# Patient Record
Sex: Female | Born: 1940
Health system: Southern US, Community
[De-identification: ages and names within clinical notes are randomized; demographics above are authoritative.]

## PROBLEM LIST (undated history)

## (undated) DIAGNOSIS — F419 Anxiety disorder, unspecified: Secondary | ICD-10-CM

## (undated) DIAGNOSIS — I219 Acute myocardial infarction, unspecified: Secondary | ICD-10-CM

## (undated) DIAGNOSIS — F329 Major depressive disorder, single episode, unspecified: Secondary | ICD-10-CM

## (undated) DIAGNOSIS — I639 Cerebral infarction, unspecified: Secondary | ICD-10-CM

## (undated) DIAGNOSIS — N289 Disorder of kidney and ureter, unspecified: Secondary | ICD-10-CM

## (undated) DIAGNOSIS — I10 Essential (primary) hypertension: Secondary | ICD-10-CM

## (undated) DIAGNOSIS — K219 Gastro-esophageal reflux disease without esophagitis: Secondary | ICD-10-CM

## (undated) DIAGNOSIS — C801 Malignant (primary) neoplasm, unspecified: Secondary | ICD-10-CM

## (undated) DIAGNOSIS — E119 Type 2 diabetes mellitus without complications: Secondary | ICD-10-CM

## (undated) DIAGNOSIS — F32A Depression, unspecified: Secondary | ICD-10-CM

## (undated) DIAGNOSIS — E785 Hyperlipidemia, unspecified: Secondary | ICD-10-CM

## (undated) HISTORY — DX: Acute myocardial infarction, unspecified: I21.9

## (undated) HISTORY — DX: Gastro-esophageal reflux disease without esophagitis: K21.9

## (undated) HISTORY — PX: COLOSTOMY: SHX63

## (undated) HISTORY — PX: ABDOMINAL HYSTERECTOMY: SHX81

## (undated) HISTORY — DX: Major depressive disorder, single episode, unspecified: F32.9

## (undated) HISTORY — DX: Anxiety disorder, unspecified: F41.9

## (undated) HISTORY — DX: Cerebral infarction, unspecified: I63.9

## (undated) HISTORY — DX: Depression, unspecified: F32.A

## (undated) HISTORY — DX: Hyperlipidemia, unspecified: E78.5

## (undated) HISTORY — PX: CHOLECYSTECTOMY: SHX55

## (undated) HISTORY — PX: TONSILLECTOMY: SUR1361

## (undated) HISTORY — DX: Essential (primary) hypertension: I10

## (undated) HISTORY — PX: OOPHORECTOMY: SHX86

---

## 2002-03-13 ENCOUNTER — Encounter: Payer: Self-pay | Admitting: Internal Medicine

## 2002-03-13 ENCOUNTER — Ambulatory Visit (HOSPITAL_COMMUNITY): Admission: RE | Admit: 2002-03-13 | Discharge: 2002-03-13 | Payer: Self-pay | Admitting: Internal Medicine

## 2002-03-21 ENCOUNTER — Encounter: Payer: Self-pay | Admitting: Internal Medicine

## 2002-03-21 ENCOUNTER — Ambulatory Visit (HOSPITAL_COMMUNITY): Admission: RE | Admit: 2002-03-21 | Discharge: 2002-03-21 | Payer: Self-pay | Admitting: Internal Medicine

## 2002-07-02 ENCOUNTER — Inpatient Hospital Stay (HOSPITAL_COMMUNITY): Admission: AD | Admit: 2002-07-02 | Discharge: 2002-07-04 | Payer: Self-pay | Admitting: *Deleted

## 2002-07-03 ENCOUNTER — Encounter: Payer: Self-pay | Admitting: *Deleted

## 2002-09-06 ENCOUNTER — Ambulatory Visit (HOSPITAL_BASED_OUTPATIENT_CLINIC_OR_DEPARTMENT_OTHER): Admission: RE | Admit: 2002-09-06 | Discharge: 2002-09-06 | Payer: Self-pay | Admitting: Otolaryngology

## 2003-02-18 ENCOUNTER — Ambulatory Visit (HOSPITAL_COMMUNITY): Admission: RE | Admit: 2003-02-18 | Discharge: 2003-02-18 | Payer: Self-pay | Admitting: Family Medicine

## 2003-02-18 ENCOUNTER — Encounter: Payer: Self-pay | Admitting: Family Medicine

## 2003-02-27 ENCOUNTER — Encounter: Payer: Self-pay | Admitting: Internal Medicine

## 2003-02-27 ENCOUNTER — Ambulatory Visit (HOSPITAL_COMMUNITY): Admission: RE | Admit: 2003-02-27 | Discharge: 2003-02-27 | Payer: Self-pay | Admitting: Internal Medicine

## 2003-03-19 ENCOUNTER — Ambulatory Visit (HOSPITAL_COMMUNITY): Admission: RE | Admit: 2003-03-19 | Discharge: 2003-03-19 | Payer: Self-pay | Admitting: Ophthalmology

## 2003-06-28 ENCOUNTER — Encounter: Payer: Self-pay | Admitting: Family Medicine

## 2003-06-28 ENCOUNTER — Ambulatory Visit (HOSPITAL_COMMUNITY): Admission: RE | Admit: 2003-06-28 | Discharge: 2003-06-28 | Payer: Self-pay | Admitting: Family Medicine

## 2003-12-04 ENCOUNTER — Ambulatory Visit (HOSPITAL_COMMUNITY): Admission: RE | Admit: 2003-12-04 | Discharge: 2003-12-04 | Payer: Self-pay | Admitting: Cardiology

## 2004-03-17 ENCOUNTER — Ambulatory Visit (HOSPITAL_COMMUNITY): Admission: RE | Admit: 2004-03-17 | Discharge: 2004-03-17 | Payer: Self-pay | Admitting: Family Medicine

## 2005-02-23 ENCOUNTER — Encounter (HOSPITAL_COMMUNITY): Admission: RE | Admit: 2005-02-23 | Discharge: 2005-03-25 | Payer: Self-pay | Admitting: Oncology

## 2005-02-24 ENCOUNTER — Ambulatory Visit (HOSPITAL_COMMUNITY): Payer: Self-pay | Admitting: Internal Medicine

## 2005-03-11 ENCOUNTER — Ambulatory Visit: Payer: Self-pay | Admitting: Cardiology

## 2005-03-24 ENCOUNTER — Ambulatory Visit (HOSPITAL_COMMUNITY): Admission: RE | Admit: 2005-03-24 | Discharge: 2005-03-24 | Payer: Self-pay | Admitting: General Surgery

## 2005-04-05 ENCOUNTER — Ambulatory Visit (HOSPITAL_COMMUNITY): Admission: RE | Admit: 2005-04-05 | Discharge: 2005-04-05 | Payer: Self-pay | Admitting: Family Medicine

## 2005-06-15 ENCOUNTER — Inpatient Hospital Stay (HOSPITAL_COMMUNITY): Admission: AD | Admit: 2005-06-15 | Discharge: 2005-06-16 | Payer: Self-pay | Admitting: Internal Medicine

## 2005-06-16 ENCOUNTER — Ambulatory Visit: Payer: Self-pay | Admitting: *Deleted

## 2005-06-29 ENCOUNTER — Ambulatory Visit (HOSPITAL_COMMUNITY): Admission: RE | Admit: 2005-06-29 | Discharge: 2005-06-29 | Payer: Self-pay | Admitting: Family Medicine

## 2005-08-14 ENCOUNTER — Inpatient Hospital Stay (HOSPITAL_COMMUNITY): Admission: EM | Admit: 2005-08-14 | Discharge: 2005-08-19 | Payer: Self-pay | Admitting: Emergency Medicine

## 2005-12-15 ENCOUNTER — Ambulatory Visit: Payer: Self-pay | Admitting: *Deleted

## 2006-08-16 ENCOUNTER — Ambulatory Visit (HOSPITAL_COMMUNITY): Admission: RE | Admit: 2006-08-16 | Discharge: 2006-08-16 | Payer: Self-pay | Admitting: Obstetrics and Gynecology

## 2006-09-14 ENCOUNTER — Ambulatory Visit (HOSPITAL_COMMUNITY): Admission: RE | Admit: 2006-09-14 | Discharge: 2006-09-14 | Payer: Self-pay | Admitting: Family Medicine

## 2006-09-15 ENCOUNTER — Ambulatory Visit: Payer: Self-pay | Admitting: *Deleted

## 2006-09-20 ENCOUNTER — Ambulatory Visit (HOSPITAL_COMMUNITY): Admission: RE | Admit: 2006-09-20 | Discharge: 2006-09-20 | Payer: Self-pay | Admitting: Ophthalmology

## 2006-09-22 ENCOUNTER — Ambulatory Visit (HOSPITAL_COMMUNITY): Admission: RE | Admit: 2006-09-22 | Discharge: 2006-09-22 | Payer: Self-pay | Admitting: *Deleted

## 2006-09-22 ENCOUNTER — Ambulatory Visit: Payer: Self-pay | Admitting: Cardiology

## 2006-12-10 ENCOUNTER — Inpatient Hospital Stay (HOSPITAL_COMMUNITY): Admission: EM | Admit: 2006-12-10 | Discharge: 2006-12-17 | Payer: Self-pay | Admitting: Emergency Medicine

## 2007-02-16 ENCOUNTER — Inpatient Hospital Stay (HOSPITAL_COMMUNITY): Admission: EM | Admit: 2007-02-16 | Discharge: 2007-02-24 | Payer: Self-pay | Admitting: Emergency Medicine

## 2007-02-16 ENCOUNTER — Ambulatory Visit: Payer: Self-pay | Admitting: Gastroenterology

## 2007-02-17 ENCOUNTER — Ambulatory Visit: Payer: Self-pay | Admitting: Internal Medicine

## 2007-04-05 ENCOUNTER — Ambulatory Visit (HOSPITAL_COMMUNITY): Payer: Self-pay | Admitting: Family Medicine

## 2007-04-05 ENCOUNTER — Encounter (HOSPITAL_COMMUNITY): Admission: RE | Admit: 2007-04-05 | Discharge: 2007-05-05 | Payer: Self-pay | Admitting: Family Medicine

## 2007-04-26 ENCOUNTER — Ambulatory Visit (HOSPITAL_COMMUNITY): Admission: RE | Admit: 2007-04-26 | Discharge: 2007-04-26 | Payer: Self-pay | Admitting: Family Medicine

## 2007-09-11 ENCOUNTER — Ambulatory Visit (HOSPITAL_COMMUNITY): Admission: RE | Admit: 2007-09-11 | Discharge: 2007-09-11 | Payer: Self-pay | Admitting: Family Medicine

## 2007-09-11 ENCOUNTER — Encounter: Payer: Self-pay | Admitting: Orthopedic Surgery

## 2007-10-09 ENCOUNTER — Ambulatory Visit: Payer: Self-pay | Admitting: Orthopedic Surgery

## 2007-10-09 DIAGNOSIS — M25519 Pain in unspecified shoulder: Secondary | ICD-10-CM | POA: Insufficient documentation

## 2007-10-09 DIAGNOSIS — M75120 Complete rotator cuff tear or rupture of unspecified shoulder, not specified as traumatic: Secondary | ICD-10-CM | POA: Insufficient documentation

## 2008-07-11 ENCOUNTER — Ambulatory Visit (HOSPITAL_COMMUNITY): Admission: RE | Admit: 2008-07-11 | Discharge: 2008-07-11 | Payer: Self-pay | Admitting: Family Medicine

## 2008-10-01 ENCOUNTER — Ambulatory Visit (HOSPITAL_COMMUNITY): Admission: RE | Admit: 2008-10-01 | Discharge: 2008-10-01 | Payer: Self-pay | Admitting: Family Medicine

## 2008-11-11 ENCOUNTER — Ambulatory Visit (HOSPITAL_COMMUNITY): Admission: RE | Admit: 2008-11-11 | Discharge: 2008-11-11 | Payer: Self-pay | Admitting: Family Medicine

## 2009-01-16 ENCOUNTER — Ambulatory Visit: Payer: Self-pay | Admitting: Cardiology

## 2009-01-22 ENCOUNTER — Ambulatory Visit (HOSPITAL_COMMUNITY): Admission: RE | Admit: 2009-01-22 | Discharge: 2009-01-22 | Payer: Self-pay | Admitting: Cardiology

## 2009-01-22 ENCOUNTER — Ambulatory Visit: Payer: Self-pay | Admitting: Cardiology

## 2009-01-22 ENCOUNTER — Encounter: Payer: Self-pay | Admitting: Cardiology

## 2009-04-06 ENCOUNTER — Emergency Department (HOSPITAL_COMMUNITY): Admission: EM | Admit: 2009-04-06 | Discharge: 2009-04-07 | Payer: Self-pay | Admitting: Emergency Medicine

## 2009-07-25 ENCOUNTER — Ambulatory Visit (HOSPITAL_COMMUNITY): Admission: RE | Admit: 2009-07-25 | Discharge: 2009-07-25 | Payer: Self-pay | Admitting: Family Medicine

## 2009-11-14 ENCOUNTER — Ambulatory Visit (HOSPITAL_COMMUNITY): Admission: RE | Admit: 2009-11-14 | Discharge: 2009-11-14 | Payer: Self-pay | Admitting: Ophthalmology

## 2010-02-20 ENCOUNTER — Ambulatory Visit: Payer: Self-pay | Admitting: Cardiology

## 2010-02-20 DIAGNOSIS — I6529 Occlusion and stenosis of unspecified carotid artery: Secondary | ICD-10-CM | POA: Insufficient documentation

## 2010-02-20 DIAGNOSIS — I350 Nonrheumatic aortic (valve) stenosis: Secondary | ICD-10-CM | POA: Insufficient documentation

## 2010-02-20 DIAGNOSIS — I2510 Atherosclerotic heart disease of native coronary artery without angina pectoris: Secondary | ICD-10-CM

## 2010-02-26 ENCOUNTER — Ambulatory Visit (HOSPITAL_COMMUNITY): Admission: RE | Admit: 2010-02-26 | Discharge: 2010-02-26 | Payer: Self-pay | Admitting: Cardiology

## 2010-02-26 ENCOUNTER — Ambulatory Visit: Payer: Self-pay | Admitting: Cardiology

## 2010-02-26 ENCOUNTER — Encounter: Payer: Self-pay | Admitting: Cardiology

## 2010-03-02 ENCOUNTER — Encounter: Payer: Self-pay | Admitting: Cardiology

## 2010-09-21 ENCOUNTER — Ambulatory Visit (HOSPITAL_COMMUNITY): Admission: RE | Admit: 2010-09-21 | Discharge: 2010-09-21 | Payer: Self-pay | Admitting: Nephrology

## 2010-10-15 ENCOUNTER — Inpatient Hospital Stay (HOSPITAL_COMMUNITY): Admission: EM | Admit: 2010-10-15 | Discharge: 2010-04-05 | Payer: Self-pay | Admitting: Emergency Medicine

## 2010-11-28 ENCOUNTER — Encounter: Payer: Self-pay | Admitting: Family Medicine

## 2010-11-29 ENCOUNTER — Encounter: Payer: Self-pay | Admitting: Cardiology

## 2010-11-29 ENCOUNTER — Encounter: Payer: Self-pay | Admitting: Family Medicine

## 2010-11-29 ENCOUNTER — Encounter: Payer: Self-pay | Admitting: General Surgery

## 2010-12-10 NOTE — Letter (Signed)
Summary: [place] [employer], agricultural at [facility]  [address] [place] [address]   Phone: [phone]  Fax: [phone]      March 02, 2010 MRN: [id number]   [name] [name] [address] [place], [place]  [address]   Dear Ms. Dilorenzo,  Your test ordered by Selena Batten has been reviewed by your physician (or physician assistant) and was found to be normal or stable. Your physician (or physician assistant) felt no changes were needed at this time.  __X__ Echocardiogram  ____ Cardiac Stress Test  ____ Lab Work  __X__ Peripheral vascular study of arms, legs or neck (Carotid Duplex)  ____ CT scan or X-ray  ____ Lung or Breathing test  ____ Other: Please continue on current medical treatment. Also, If you are not allergic to Aspirin please take 81mg  by mouth once daily. Thank you.   Valera Castle, MD, F.A.C.C

## 2010-12-10 NOTE — Assessment & Plan Note (Signed)
Summary: 1 yr f/u per checkout on 01/16/09/tg  Medications Added * XANAX 0.5MG  take 1 tab as needed CLONIDINE HCL 0.2 MG TABS (CLONIDINE HCL) take 1 tab qid TOPROL XL 100 MG XR24H-TAB (METOPROLOL SUCCINATE) take 1 tab daily NEXIUM 40 MG CPDR (ESOMEPRAZOLE MAGNESIUM) take 1 tab daily FEXOFENADINE HCL 180 MG TABS (FEXOFENADINE HCL) take 1 tab daily TYLENOL 325 MG TABS (ACETAMINOPHEN) take 1 tab at bedtime ONDANSETRON HCL 4 MG TABS (ONDANSETRON HCL) take as needed for nausea ZOLPIDEM TARTRATE 10 MG TABS (ZOLPIDEM TARTRATE) take 1 tab at bedtime      Allergies Added:   Visit Type:  Follow-up Primary Provider:  Dr.Mark Nobie Putnam  CC:  chest pain and sob.  History of Present Illness: [name] [name] comes in today for follow for nonobstructive coronary disease, history of atrial fibrillation, and chronic chest pain.  A sore year ago. At that time she was having chest [name] [name] pain.  She is very stressed out about her husband being in a nursing home her financial status.  She denies any angina. She has occasional non-coronary chest pain. She has been dyspnea on exertion. She also has some pedal edema.  She denies any orthopnea, PND, palpitations syncope.  Current Medications (verified): 1)  Xanax 0.5mg  .... Take 1 Tab As Needed 2)  Clonidine Hcl 0.2 Mg Tabs (Clonidine Hcl) .... Take 1 Tab Qid 3)  Hydrocodone Apap 7.5/325mg  .... 1q4 Hrs Prn 4)  Toprol Xl 100 Mg Xr24h-Tab (Metoprolol Succinate) .... Take 1 Tab Daily 5)  Nexium 40 Mg Cpdr (Esomeprazole Magnesium) .... Take 1 Tab Daily 6)  Fexofenadine Hcl 180 Mg Tabs (Fexofenadine Hcl) .... Take 1 Tab Daily 7)  Tylenol 325 Mg Tabs (Acetaminophen) .... Take 1 Tab At Bedtime 8)  Ondansetron Hcl 4 Mg Tabs (Ondansetron Hcl) .... Take As Needed For Nausea 9)  Zolpidem Tartrate 10 Mg Tabs (Zolpidem Tartrate) .... Take 1 Tab At Bedtime  Allergies (verified): 1)  ! Pcn 2)  ! Darvocet  Past History:  Past Medical History: Last updated:  10/09/2007 anxiety depression high blood pressure acid reflux cholesterol heart attack  2 strokes  Past Surgical History: Last updated: 10/09/2007 hysterectomy Cholecystectomy ovary removed colostomy Tonsillectomy  Family History: Last updated: 10/09/2007 FH of Cancer:  Family History of Diabetes Family History Coronary Heart Disease female < 66 Family History of Arthritis Hx, family, asthma Hx, family, kidney disease NEC  Social History: Last updated: 10/09/2007 Patient is married.   Risk Factors: Caffeine Use: 2 (10/09/2007)  Risk Factors: Smoking Status: never (10/09/2007)  Review of Systems       negative other than history of present illness  Vital Signs:  Patient profile:   70 year old female Height:      65 inches Weight:      206 pounds BMI:     34.40 Pulse rate:   59 / minute BP sitting:   144 / 64  (right arm)  Vitals Entered By: [name] Saa, [name] [name] (February 20, 2010 9:47 AM)  Physical Exam  General:  obese.   Head:  normocephalic and atraumatic Eyes:  PERRLA/EOM intact; conjunctiva and lids normal. Neck:  Neck supple, no JVD. No masses, thyromegaly or abnormal cervical nodes. Chest [name] [name]:  no deformities or breast masses noted Lungs:  clear to auscultation percussion Heart:  PMI difficult to appreciate, soft S1-S2, systolic murmur along left sternal border. It is difficult to hear S2 split. She has bilateral carotid sounds greater on the left than the right. Msk:  decreased  ROM.   Pulses:  pulses normal in all 4 extremities Extremities:  trace left pedal edema and trace right pedal edema.   Neurologic:  Alert and oriented x 3. Skin:  Intact without lesions or rashes. Psych:  Normal affect.   Problems:  Medical Problems Added: 1)  Dx of Cad, Native Vessel  (ICD-414.01) 2)  Dx of Occlusion&stenos Carotid Art w/o Mention Infarct  (ICD-433.10) 3)  Dx of Cardiac Murmur  (ICD-785.2)  EKG  Procedure date:  02/20/2010  Findings:      normal  size rhythm, occasional PVCs, no ST segment changes.  Impression & Recommendations:  Problem # 1:  CARDIAC MURMUR (ICD-785.2) Assessment New I will obtain a 2-D echocardiogram. I suspect this is aortic sclerosis. Her updated medication list for this problem includes:    Toprol Xl 100 Mg Xr24h-tab (Metoprolol succinate) .Marland Kitchen... Take 1 tab daily  Orders: 2-D Echocardiogram (2D Echo)  Problem # 2:  OCCLUSION&STENOS CAROTID ART W/O MENTION INFARCT (ICD-433.10) Assessment: New I suspect this is a referred sound but will check carotid Dopplers. No change in treatment. Orders: Carotid Duplex (Carotid Duplex)  Problem # 3:  CAD, NATIVE VESSEL (ICD-414.01) Assessment: Unchanged  Her updated medication list for this problem includes:    Toprol Xl 100 Mg Xr24h-tab (Metoprolol succinate) .Marland Kitchen... Take 1 tab daily  Patient Instructions: 1)  Your physician recommends that you schedule a follow-up appointment in: 1 year 2)  Your physician recommends that you continue on your current medications as directed. Please refer to the Current Medication list given to you today. 3)  Your physician has requested that you have a carotid duplex. This test is an ultrasound of the carotid arteries in your neck. It looks at blood flow through these arteries that supply the brain with blood. Allow one hour for this exam. There are no restrictions or special instructions. 4)  Your physician has requested that you have an echocardiogram.  Echocardiography is a painless test that uses sound waves to create images of your heart. It provides your doctor with information about the size and shape of your heart and how well your heart's chambers and valves are working.  This procedure takes approximately one hour. There are no restrictions for this procedure.

## 2011-01-24 LAB — BASIC METABOLIC PANEL
BUN: 16 mg/dL (ref 6–23)
CO2: 30 mEq/L (ref 19–32)
Calcium: 9.7 mg/dL (ref 8.4–10.5)
GFR calc non Af Amer: 42 mL/min — ABNORMAL LOW (ref >60)
Potassium: 4.4 mEq/L (ref 3.5–5.1)
Sodium: 140 mEq/L (ref 135–145)

## 2011-01-24 LAB — HEMOGLOBIN AND HEMATOCRIT, BLOOD: HCT: 38.5 % (ref 36.0–46.0)

## 2011-01-25 LAB — BASIC METABOLIC PANEL
BUN: 15 mg/dL (ref 6–23)
BUN: 16 mg/dL (ref 6–23)
BUN: 28 mg/dL — ABNORMAL HIGH (ref 6–23)
BUN: 34 mg/dL — ABNORMAL HIGH (ref 6–23)
CO2: 18 mEq/L — ABNORMAL LOW (ref 19–32)
CO2: 21 mEq/L (ref 19–32)
CO2: 29 mEq/L (ref 19–32)
Calcium: 9.6 mg/dL (ref 8.4–10.5)
Chloride: 102 mEq/L (ref 96–112)
Chloride: 106 mEq/L (ref 96–112)
Chloride: 110 mEq/L (ref 96–112)
Chloride: 111 mEq/L (ref 96–112)
Creatinine, Ser: 1.45 mg/dL — ABNORMAL HIGH (ref 0.4–1.2)
Creatinine, Ser: 1.51 mg/dL — ABNORMAL HIGH (ref 0.4–1.2)
Creatinine, Ser: 1.57 mg/dL — ABNORMAL HIGH (ref 0.4–1.2)
GFR calc Af Amer: 41 mL/min — ABNORMAL LOW (ref >60)
GFR calc Af Amer: 43 mL/min — ABNORMAL LOW (ref >60)
GFR calc non Af Amer: 33 mL/min — ABNORMAL LOW (ref >60)
GFR calc non Af Amer: 39 mL/min — ABNORMAL LOW (ref >60)
Glucose, Bld: 110 mg/dL — ABNORMAL HIGH (ref 70–99)
Glucose, Bld: 176 mg/dL — ABNORMAL HIGH (ref 70–99)
Glucose, Bld: 268 mg/dL — ABNORMAL HIGH (ref 70–99)
Potassium: 3.5 mEq/L (ref 3.5–5.1)
Potassium: 5.3 mEq/L — ABNORMAL HIGH (ref 3.5–5.1)
Potassium: 5.6 mEq/L — ABNORMAL HIGH (ref 3.5–5.1)
Sodium: 136 mEq/L (ref 135–145)
Sodium: 137 mEq/L (ref 135–145)

## 2011-01-25 LAB — GLUCOSE, CAPILLARY
Glucose-Capillary: 137 mg/dL — ABNORMAL HIGH (ref 70–99)
Glucose-Capillary: 150 mg/dL — ABNORMAL HIGH (ref 70–99)
Glucose-Capillary: 153 mg/dL — ABNORMAL HIGH (ref 70–99)
Glucose-Capillary: 158 mg/dL — ABNORMAL HIGH (ref 70–99)
Glucose-Capillary: 160 mg/dL — ABNORMAL HIGH (ref 70–99)
Glucose-Capillary: 172 mg/dL — ABNORMAL HIGH (ref 70–99)
Glucose-Capillary: 176 mg/dL — ABNORMAL HIGH (ref 70–99)
Glucose-Capillary: 176 mg/dL — ABNORMAL HIGH (ref 70–99)
Glucose-Capillary: 181 mg/dL — ABNORMAL HIGH (ref 70–99)
Glucose-Capillary: 181 mg/dL — ABNORMAL HIGH (ref 70–99)
Glucose-Capillary: 189 mg/dL — ABNORMAL HIGH (ref 70–99)
Glucose-Capillary: 193 mg/dL — ABNORMAL HIGH (ref 70–99)
Glucose-Capillary: 228 mg/dL — ABNORMAL HIGH (ref 70–99)
Glucose-Capillary: 232 mg/dL — ABNORMAL HIGH (ref 70–99)

## 2011-01-25 LAB — CBC
HCT: 36.5 % (ref 36.0–46.0)
HCT: 40.3 % (ref 36.0–46.0)
Hemoglobin: 12.1 g/dL (ref 12.0–15.0)
Hemoglobin: 12.6 g/dL (ref 12.0–15.0)
MCHC: 34 g/dL (ref 30.0–36.0)
MCHC: 34.1 g/dL (ref 30.0–36.0)
MCHC: 34.3 g/dL (ref 30.0–36.0)
MCV: 87.7 fL (ref 78.0–100.0)
MCV: 87.8 fL (ref 78.0–100.0)
Platelets: 176 10*3/uL (ref 150–400)
Platelets: 182 10*3/uL (ref 150–400)
Platelets: 197 10*3/uL (ref 150–400)
RBC: 4.06 MIL/uL (ref 3.87–5.11)
RDW: 13 % (ref 11.5–15.5)
RDW: 13.3 % (ref 11.5–15.5)
RDW: 13.7 % (ref 11.5–15.5)
WBC: 10.6 10*3/uL — ABNORMAL HIGH (ref 4.0–10.5)

## 2011-01-25 LAB — DIFFERENTIAL
Basophils Absolute: 0 10*3/uL (ref 0.0–0.1)
Basophils Absolute: 0.2 10*3/uL — ABNORMAL HIGH (ref 0.0–0.1)
Basophils Relative: 0 % (ref 0–1)
Basophils Relative: 0 % (ref 0–1)
Basophils Relative: 1 % (ref 0–1)
Eosinophils Absolute: 0 10*3/uL (ref 0.0–0.7)
Eosinophils Absolute: 0 10*3/uL (ref 0.0–0.7)
Eosinophils Relative: 0 % (ref 0–5)
Eosinophils Relative: 3 % (ref 0–5)
Lymphs Abs: 0.7 10*3/uL (ref 0.7–4.0)
Lymphs Abs: 1.4 10*3/uL (ref 0.7–4.0)
Lymphs Abs: 2.9 10*3/uL (ref 0.7–4.0)
Monocytes Absolute: 0.1 10*3/uL (ref 0.1–1.0)
Monocytes Absolute: 0.3 10*3/uL (ref 0.1–1.0)
Monocytes Absolute: 0.8 10*3/uL (ref 0.1–1.0)
Monocytes Relative: 8 % (ref 3–12)
Neutro Abs: 5 10*3/uL (ref 1.7–7.7)
Neutro Abs: 8.3 10*3/uL — ABNORMAL HIGH (ref 1.7–7.7)
Neutro Abs: 8.9 10*3/uL — ABNORMAL HIGH (ref 1.7–7.7)
Neutrophils Relative %: 78 % — ABNORMAL HIGH (ref 43–77)
Neutrophils Relative %: 94 % — ABNORMAL HIGH (ref 43–77)

## 2011-01-25 LAB — URINALYSIS, ROUTINE W REFLEX MICROSCOPIC
Bilirubin Urine: NEGATIVE
Leukocytes, UA: NEGATIVE
Nitrite: NEGATIVE
Specific Gravity, Urine: 1.025 (ref 1.005–1.030)
Urobilinogen, UA: 0.2 mg/dL (ref 0.0–1.0)
pH: 5.5 (ref 5.0–8.0)

## 2011-01-25 LAB — CARDIAC PANEL(CRET KIN+CKTOT+MB+TROPI)
CK, MB: 2.7 ng/mL (ref 0.3–4.0)
CK, MB: 7.5 ng/mL (ref 0.3–4.0)
Relative Index: 4.9 — ABNORMAL HIGH (ref 0.0–2.5)
Relative Index: 5.9 — ABNORMAL HIGH (ref 0.0–2.5)
Relative Index: INVALID (ref 0.0–2.5)
Total CK: 156 U/L (ref 7–177)
Total CK: 42 U/L (ref 7–177)
Total CK: 94 U/L (ref 7–177)
Troponin I: 0.02 ng/mL (ref 0.00–0.06)
Troponin I: 0.04 ng/mL (ref 0.00–0.06)

## 2011-01-25 LAB — BLOOD GAS, ARTERIAL
Acid-base deficit: 1.1 mmol/L (ref 0.0–2.0)
Bicarbonate: 23.5 mEq/L (ref 20.0–24.0)
O2 Saturation: 93 %
TCO2: 21 mmol/L (ref 0–100)
pCO2 arterial: 41.8 mmHg (ref 35.0–45.0)
pO2, Arterial: 64 mmHg — ABNORMAL LOW (ref 80.0–100.0)

## 2011-01-25 LAB — LIPID PANEL
Cholesterol: 193 mg/dL (ref 0–200)
HDL: 56 mg/dL (ref >39)
Triglycerides: 85 mg/dL (ref <150)

## 2011-01-25 LAB — D-DIMER, QUANTITATIVE: D-Dimer, Quant: 1.21 ug/mL-FEU — ABNORMAL HIGH (ref 0.00–0.48)

## 2011-01-25 LAB — BRAIN NATRIURETIC PEPTIDE: Pro B Natriuretic peptide (BNP): 110 pg/mL — ABNORMAL HIGH (ref 0.0–100.0)

## 2011-01-25 LAB — URINE MICROSCOPIC-ADD ON

## 2011-01-25 LAB — RAPID URINE DRUG SCREEN, HOSP PERFORMED
Benzodiazepines: POSITIVE — AB
Cocaine: NOT DETECTED
Opiates: NOT DETECTED

## 2011-01-25 LAB — CK TOTAL AND CKMB (NOT AT ARMC): Total CK: 41 U/L (ref 7–177)

## 2011-01-25 LAB — TSH: TSH: 3.832 u[IU]/mL (ref 0.350–4.500)

## 2011-01-25 LAB — URINE CULTURE

## 2011-03-22 DIAGNOSIS — F329 Major depressive disorder, single episode, unspecified: Secondary | ICD-10-CM

## 2011-03-22 DIAGNOSIS — E785 Hyperlipidemia, unspecified: Secondary | ICD-10-CM

## 2011-03-22 DIAGNOSIS — I639 Cerebral infarction, unspecified: Secondary | ICD-10-CM | POA: Insufficient documentation

## 2011-03-22 DIAGNOSIS — F419 Anxiety disorder, unspecified: Secondary | ICD-10-CM | POA: Insufficient documentation

## 2011-03-22 DIAGNOSIS — I10 Essential (primary) hypertension: Secondary | ICD-10-CM | POA: Insufficient documentation

## 2011-03-22 DIAGNOSIS — K219 Gastro-esophageal reflux disease without esophagitis: Secondary | ICD-10-CM | POA: Insufficient documentation

## 2011-03-22 DIAGNOSIS — I219 Acute myocardial infarction, unspecified: Secondary | ICD-10-CM

## 2011-03-23 NOTE — Assessment & Plan Note (Signed)
[facility]                       [place] [facility] OFFICE NOTE   NAME:[name], [name] SHINSKY                   MRN:          [id number]  DATE:01/16/2009                            DOB:          [birth year]    [name] [name] comes in today per the request of Dr. Patrica Duel for  chest pain.   She has been a former patient of Dr. Graceann Congress.  Dr. Corinda Gubler saw  her last in November 2007.   She has had a history of chest pain but has had nonobstructive coronary  artery disease by cath last being in 2003.  She has normal left  ventricular systolic function, EF 79% with no ischemia on a stress test  in August 2006.   She was struck by chest pain shooting across her chest from right to  left 3 days prior to seeing Dr. Nobie Putnam on the 9th.  She was advised to  go to the emergency room but she went to his office.  There her EKG was  unremarkable.  He wrote her a prescription for Nitrostat, checked Chem-  7, and sent over for evaluation.   Her symptoms are clearly at rest.  There are no other associated  symptoms including nausea, vomiting, diaphoresis, or shortness of  breath.   Her history is also significant for atrial fibrillation, which she has  not had a number of years.  She is currently on Nexium 40 mg a day for  reflux, Toprol 50 mg per day, zolpidem 10 mg at bedtime, clonidine 0.2  mg q.i.d., simvastatin 20 mg a day.   She is intolerant of all nonsteroidals because it makes her blood thick,  and she will have a stroke.  She was told this by Dr. Lowry Bowl, a  cardiologist in [place] years ago.  She lists also penicillin,  Lorcet.   Her exam today, she is in no acute distress.  She is overweight at 217.  Her blood pressure is 147/78, her pulse is 70 and regular.  She is in  sinus rhythm with no acute ST-segment changes.  HEENT is normal except  for poor dentition.  Neck is supple.  Carotid upstrokes were equal  bilaterally without bruits.   No JVD.  Thyroid is not enlarged.  Trachea  is midline.  Neck is supple.  Chest and lungs were clear to auscultation  and percussion without rub or rhonchi.  Heart reveals a poorly  appreciated PMI.  She has large breasts.  She is exquisitely tender  cross her sternum particularly in the mid sternal area and right  costosternal border.  Abdominal exam is obese with good bowel sounds.  Organomegaly cannot be assessed.  Extremities revealed no edema.  There  is no sign of DVT.  Pulses were present.  She has a few varicose veins.  Neuro exam is grossly intact.   ASSESSMENT:  Costochondritis.  This has probably been exacerbated by  inadvertent pull or strain, and she also has large breasts that created  constant full.   This is not cardiac and or coronary in my opinion.   Listening to her chest again I  heard a systolic murmur that I think is  probably aortic.  I could not hear S2 split.  Looking back at her last  echo she has some aortic sclerosis but no stenosis.   PLAN:  1. Followup 2-D echocardiogram to assess aortic valve.  2. Heat to the chest.  3. Avoid exacerbating activities.  4. Supportive bra.  5. No nonsteroidals because of her intolerance in the past.  She can      use Tylenol for pain.  I advised her this was not an anti-      inflammatory.     Thomas C. Daleen Squibb, MD, [facility]  Electronically Signed    TCW/MedQ  DD: 01/16/2009  DT: 01/17/2009  Job #: [id number]   cc:   Patrica Duel, M.D.

## 2011-03-24 ENCOUNTER — Encounter: Payer: Self-pay | Admitting: Cardiology

## 2011-03-24 ENCOUNTER — Ambulatory Visit: Payer: Self-pay | Admitting: Cardiology

## 2011-03-26 NOTE — Consult Note (Signed)
[name] [name], POARCH NO.:  [id number]   MEDICAL RECORD NO.:  [id number]          PATIENT TYPE:  INP   LOCATION:  A219                          FACILITY:  [facility]   PHYSICIAN:  Jorja Loa, M.D.DATE OF BIRTH:  [birth year]   DATE OF CONSULTATION:  DATE OF DISCHARGE:                                 CONSULTATION   REQUESTING PHYSICIAN:  Madelin Rear. Sherwood Gambler, M.D.   REASON FOR CONSULTATION:  Renal insufficiency.   [name] is 70 years old, Caucasian female, with a past medical  history of hypertension, atrial fibrillation, diabetes recently admitted  because of a perforated diverticulosis, status post colostomy placement,  presently admitted because of weakness.  She was found to have  significant elevated BUN and creatinine, hence a consult was called.  The patient denies any previous history of kidney stones.  No history of  renal failure.  And, also she denies any history of diabetic retinopathy  or neuropathy.   PAST MEDICAL HISTORY:  1. She has a history of hypertension.  2. She has a history of bipolar __________  disorder.  3. History of diabetes.  4. History of perforated diverticulosis, status post colectomy with      colostomy placement.  5. History of atrial fibrillation.  6. History of GERD.  7. History of coronary artery disease.  8. History of also some leg swelling.   MEDICATIONS:  At this moment, consist of:  1. Primaxin 25 mg IV daily.  2. Insulin.  3. Solu-Medrol 125 mg IV daily.  4. Lopressor 50 mg p.o. daily.  5. Singulair 10 mg p.o. daily.  6. Protonix 40 mg p.o. daily.  7. She is getting normal saline at 125 cc/hr.  8. Spiriva 18 mg inhaler.  9. Xanax 0.5 mg p.o. daily.  10.Hydralazine 10 mg IV q.4 h. p.r.n.  11.She is also on Dilaudid.  12.She is getting Zofran.  13.Rocephin 1 gram IV daily.   ALLERGIES:  1. SHE IS ALLERGIC TO SULFA.  2. SHE IS ALLERGIC TO PENICILLIN.  3. ASPIRIN.  4. AND SHE IS ALSO ALLERGIC TO  PROPOXYPHENE NAPSYLATE.   SOCIAL HISTORY:  No history of smoking.  No history of alcohol abuse.   FAMILY HISTORY:  Diabetes.  No history of renal failure.   REVIEW OF SYSTEMS:  Mainly complaints of weakness, a poor appetite, some  nausea but no vomiting.  She denies any shortness of breath, chest pain.  She does not have any appetite.  She most of the time drains about 2-3  bags from her colostomy.   PHYSICAL EXAMINATION:  GENERAL:  The patient is alert.  She looks pale,  otherwise anicteric.  HEENT:  She had dry oral mucosa.  VITAL SIGNS:  Temperature of 98, blood pressure 162/70, pulse of 110.  CHEST:  Decreased breath sounds, otherwise lungs were clear.  No rales,  no rhonchi, no egophony.  HEART:  Revealed a regular rate and rhythm.  No murmur.  ABDOMEN:  Soft.  Positive bowel sounds.  EXTREMITIES:  She has plus edema.   Her white blood cell count is 10, hemoglobin 7.7, crit 21.7, platelets  of 235.  Sodium 138, potassium 3.5, BUN 44, creatinine 4.5.  She has  blood work from February 2008, where her BUN was 19 and creatinine 0.86.  Her albumin is 2.2.  Calcium is 6.7.  UA:  Specific gravity is 1.02,  brown cloudy urine, large blood, significant protein but the last time  no protein.  Her urine is nitrate positive.  She has a few bacteria.  She has also hemoccult positive.   ASSESSMENT:  1. Renal insufficiency.  At this moment, it seems to be acute as the      patient has normal BUN and creatinine recently.  The etiology could      be acute tubular necrosis, versus prerenal syndrome, however, since      the patient has recent abdominal surgery and obstructive uropathy      also need to be entertained as a possibility.  2. Anemia secondary to gastrointestinal bleeding.  H&H is very low.  3. History of perforated diverticulosis, status post colectomy with a      colostomy bag.  4. History of bipolar disorder.  5. History of chronic obstructive pulmonary disease.  She is on  a      steroid and inhaler.  6. History of atrial fibrillation.  Her heart rate __________, but      overall seems to be controlled.  She is on Lopressor.  7. Diabetes.  She is on insulin.  8. Urosepsis.  The patient is with very concentrated and turbid urine.      At this moment cultures are pending.  She is on antibiotics.  She      is afebrile.  Her white cell count seems to be normal.   RECOMMENDATIONS:  Aggressive hydration since the patient has also some  edema, probably will give her some Lasix to improve her urine output.  We will continue other treatment and we will follow the patient.  I  agree with ultrasound of the kidneys to rule out obstruction.  I will  continue with other treatments and will follow input and output.      Jorja Loa, M.D.  Electronically Signed     BB/MEDQ  D:  02/16/2007  T:  02/16/2007  Job:  [id number]

## 2011-03-26 NOTE — H&P (Signed)
NAMEJOSIANE, LABINE NO.:  [id number]   MEDICAL RECORD NO.:  [id number]          PATIENT TYPE:  INP   LOCATION:  A306                          FACILITY:  [facility]   PHYSICIAN:  Kirk Ruths, M.D.DATE OF BIRTH:  1941-07-02   DATE OF ADMISSION:  12/10/2006  DATE OF DISCHARGE:  LH                              HISTORY & PHYSICAL   ADDENDUM:   ALLERGIES:  CIPRO, ASPIRIN, TETRACYCLINE, DEMEROL, PENICILLIN, DARVOCET,  AND VICODIN.      Kirk Ruths, M.D.  Electronically Signed     WMM/MEDQ  D:  12/11/2006  T:  12/11/2006  Job:  [id number]

## 2011-03-26 NOTE — Consult Note (Signed)
[name], [name] NO.:  [id number]   MEDICAL RECORD NO.:  [id number]          PATIENT TYPE:  INP   LOCATION:  A222                          FACILITY:  [facility]   PHYSICIAN:  Cecil Cranker, M.D.DATE OF BIRTH:  December 07, 1940   DATE OF CONSULTATION:  06/16/2005  DATE OF DISCHARGE:                                   CONSULTATION   REFERRING PHYSICIAN:  Madelin Rear. Sherwood Gambler, MD.   PRIMARY CARDIOLOGIST:  [place] Bing, M.D.   HISTORY OF PRESENT ILLNESS:  [name] [name] is a 70 year old female with past  medical history significant for nonobstructive coronary artery disease by  catheterization in August 2003, who presented to her primary care physician  with complaints of chest discomfort.  She reports the day prior to admission  while sitting in a car, she had onset of diaphoresis with dull anterior  chest discomfort with associated shortness of breath that lasted  approximately 10 minutes.  For the rest of the day, she continued to have  intermittent chest discomfort.  Denies any relation to exertional pain,  resolved on admission with no recurrence.   PAST MEDICAL HISTORY:  1.  Cardiac catheterization in August 2003 revealed nonobstructive disease      with a normal EF.  2.  Diet-controlled diabetes mellitus.  3.  Gastroesophageal reflux disorder.  4.  Holter monitor in January 2005 that revealed symptomatic PVCs, frequent      chest pain.  5.  Carotid Dopplers in August 2004 revealing no hemodynamically significant      stenosis.  6.  History of left breast mastitis in May 2006.  7.  Asthma.  8.  History of migraines.  9.  History of fatty liver.  10. Status post cholecystectomy.  11. Status post hysterectomy.   SOCIAL HISTORY:  Lives in [place] with her husband.  She has five children.  She is retired.  She has a remote smoking history with cessation many years  ago. No alcohol use or illicit drug use.   ALLERGIES:  PENICILLIN, ASPIRIN, SULFA, DEMEROL,  DARVOCET, VICODIN, CIPRO.   MEDICATIONS PRIOR TO ADMISSION:  1.  Actos, unknown dose, daily.  2.  Toprol, unknown dose, daily.  3.  Nexium, unknown dose, daily.  4.  Ventolin as needed.  5.  Xanax as needed.  6.  Hydrocodone as needed.  7.  Sleeping pill, unknown.   MEDICATIONS IN THE HOSPITAL:  1.  Zithromax 500 mg IV daily.  2.  Rocephin 1 g IV daily.  3.  Nexium 40 mg daily.  4.  Toprol 50 mg daily.  5.  IV normal saline at 50 mL per hour.   FAMILY HISTORY:  Mother deceased at 36 years old secondary to MI.  She had  heart trouble since she was 69 years old.  Father deceased at 75 years old  secondary to congestive heart failure.  Three sisters and two brothers with  history of diabetes.   REVIEW OF SYSTEMS:  Positive for occasional sweats, headache, occasional  lightheadedness, chronic orthopnea, PND, occasional peripheral edema,  occasional palpitations, cough, and wheezing with asthma.  Left hand  numbness occasionally.  Arthralgias all over and occasional nausea.  All  other systems reviewed and are negative except per HPI.   PHYSICAL EXAMINATION:  VITAL SIGNS:  Temperature 97, pulse 71, respirations  20, blood pressure 163/72, weight 235 pounds.  GENERAL:  Well-developed female in no acute distress.  HEENT:  Normocephalic and atraumatic.  Pupils equal, round, and reactive to  light.  NECK:  No bruits, no jugular venous distention, no lymphadenopathy.  CARDIOVASCULAR:  Normal S1 and S2 with regular rate and rhythm.  LUNGS: Clear to auscultation bilaterally without wheezes, rales, or rhonchi.  SKIN:  Warm and dry with no obvious rashes noted.  BREASTS:  Clinical breast exam is deferred.  ABDOMEN:  Obese, soft, nontender, with active bowel sounds.  GU/RECTAL:  Exams deferred.  EXTREMITIES:  No cyanosis, clubbing, or edema.  Distal pulses intact  bilaterally.  No obvious joint deformity is noted.  NEUROLOGIC:  Alert and oriented x 3.  Cranial nerves are grossly  intact.   Chest x-ray reveals stable scarring right horizontal fissure right middle  lobe but mild cardiomegaly.  No edema or acute infiltrate and stable  gastroesophageal banding procedure.  Osteopenia noted.   Electrocardiogram reveals sinus rhythm with PVC at rate of 78 beats per  minute, normal axis, normal PR interval, QRS duration, __________  nonspecific ST abnormalities are noted.   LABORATORY DATA:  White blood cells 8.4, hemoglobin 12.6, hematocrit 37,  platelets 270.  BMET reveals sodium of 140, potassium 4.6, chloride 106, CO2  28, BUN 12, creatinine 0.8, glucose 94.  LFTs within normal limits.  Amylase  38, lipase 22, calcium 9.4.  Cardiac enzymes negative x 3.  D-dimer 0.6.   Chest CT reveals no pulmonary embolus.   IMPRESSION AND PLAN:  1.  Chest discomfort, atypical for coronary disease.  D-dimer was mildly      elevated.  Chest CT ruled out pulmonary embolus.  Cardiac enzymes have      been negative x 3 for acute myocardial infarction.  She is having      adenosine Myoview today for further evaluation of ischemia.  2.  Diabetes mellitus.  Treatment and evaluation per primary team.  3.  Gastroesophageal reflux disorder.  Continue on Nexium.  Treatment per      primary team.   Patient interviewed and examined by Dr. Glennon Hamilton.  He agrees with  assessment and plan.  We appreciate this consult. Be happy to follow this  patient with you while she is in the hospital.      [name]   AB/MEDQ  D:  06/16/2005  T:  06/16/2005  Job:  [id number]   cc:   Madelin Rear. Sherwood Gambler, MD  [address]  [place]  [place] [address]  Fax: [phone]   [place] Bing, M.D.

## 2011-03-26 NOTE — H&P (Signed)
[name], [name] NO.:  [id number]   MEDICAL RECORD NO.:  [id number]          PATIENT TYPE:  INP   LOCATION:  A202                          FACILITY:  [facility]   PHYSICIAN:  Corrie Mckusick, M.D.  DATE OF BIRTH:  [birth year]   DATE OF ADMISSION:  08/14/2005  DATE OF DISCHARGE:  LH                                HISTORY & PHYSICAL   ADMISSION DIAGNOSIS:  Diverticulitis, possible abscess.   HISTORY OF PRESENT ILLNESS:  This is a 70 year old female with a history of  coronary artery disease, CVA, diverticulosis, asthma, GERD, diabetes, and  hypertension who presents with left lower quadrant pain.  She came into the  emergency room with two to three days of left lower quadrant pain getting  worse.  She felt like this was her prior diverticulitis flare which she has  not had in quite some time.   In the emergency department, she was overall stable from a vitals  standpoint, but had a diverticulitis clearly on the left side with  questionable abscess.  The radiologist could not rule this out.  This was  per the emergency room physician.  Luckily, she has no other symptomatology  from a cardiovascular or respiratory standpoint.  She has no chest pain,  shortness of breath, or other complaints.   She was admitted in August with syncope and has overall done well as an  outpatient.  She had an Adenosine Myoview which was negative on June 15, 2005.  Ejection fraction was 79%.   PAST MEDICAL HISTORY:  1.  Asthma.  2.  Coronary artery disease.  3.  CVA.  4.  GERD.  5.  Diverticulosis.  6.  Leukoplakia of the right buccal mucosa.  7.  Diabetes.  8.  Hypertension.   SOCIAL HISTORY:  Does not drink nor smoke.  No illicit drug use.   FAMILY HISTORY:  Positive for coronary disease and asthma.   REVIEW OF SYSTEMS:  As per the HPI.   MEDICATIONS ON ADMISSION:  The patient is unaware of her doses of  medications, and family member will be bringing in the doses.  1.   Toprol daily.  2.  Albuterol p.r.n.  3.  Nexium 40 mg b.i.d.  4.  Xanax 0.5 mg q.6h. p.r.n.   ALLERGIES:  1.  CIPRO.  2.  ASPIRIN.  3.  TETRACYCLINE.  4.  DEMEROL.  5.  PENICILLIN.  6.  DARVOCET.  7.  VICODIN.   PHYSICAL EXAMINATION:  VITAL SIGNS:  Temperature 97, blood pressure 130/58,  pulse 72, respirations 20.  GENERAL:  When I saw the patient, she was pleasant, talkative, feeling  overall quite well.  HEENT:  Nasal and oropharynx clear with moist mucous membranes.  NECK:  Supple, no lymphadenopathy or JVD.  CHEST:  Clear to auscultation bilaterally.  CARDIOVASCULAR:  Regular rate and rhythm, normal S1 and S2, no S3, S4,  murmurs, rubs, or gallops.  ABDOMEN:  Bowel sounds positive, soft, nontender except mild tenderness in  the left lower quadrant, no rebound, no guarding.  EXTREMITIES:  No edema.   LABORATORY DATA:  White  count 8.5, hematocrit 34.8, platelets 263.  Sodium  142, potassium 3.3, chloride 104, bicarbonate 26, glucose 109, BUN 9,  creatinine 0.9.  Urinalysis moderate blood, negative nitrites, negative  leukocyte esterase.  CT scan as stated above.   ASSESSMENT AND PLAN:  A 70 year old female with multiple comorbidities as  stated above who presents with diverticulitis and possible abscess.  Plan:  Admit to 2A for close monitoring, intravenous fluid with potassium  replacement.  We will cover with Flagyl 500 mg intravenously q.6h., and for  right now Bactrim DS p.o. b.i.d. due to her abundance of allergies.  If she  does not seem to improve, we will start imipenem as the [employer]  suggests.  We will get Dr. Katrinka Blazing to consult from a surgical standpoint.  We  will continue to follow her closely.      Corrie Mckusick, M.D.  Electronically Signed     JCG/MEDQ  D:  08/15/2005  T:  08/15/2005  Job:  [id number]

## 2011-03-26 NOTE — Procedures (Signed)
NAMEBRITTANE, GRUDZINSKI NO.:  [id number]   MEDICAL RECORD NO.:  [id number]          PATIENT TYPE:  INP   LOCATION:  A222                          FACILITY:  [facility]   PHYSICIAN:  Olga Millers, M.D. LHCDATE OF BIRTH:  [birth year]   DATE OF PROCEDURE:  06/16/2005  DATE OF DISCHARGE:                                  ECHOCARDIOGRAM   INDICATIONS FOR PROCEDURE:  The patient is a 70 year old female with past  medical history of nonobstructive coronary disease by catheterization in  August 2003.  She has been admitted to [facility] with chest pain  and study is performed to exclude ischemia.   BASELINE DATA:  Myoview 30 mCi stress images and 10 mCi of Myoview stress  images.  Adenosine was infused in the typical fashion.   The patient's resting heart rate was 76 and following infusion it was 101.  The blood pressure at rest was 130/78 and following infusion was 138/78.  There was no chest pain during this study.  There are no ST changes.  There  are occasional PACs and PVCs as well as Wenckebach noted.  This study was  terminated secondary to completion of infusion.   RESULTS:  The images were reconstructed short axis, vertical as well as  horizontal.  The stress images reveal a small defect in the anterior wall as  well as the apex.  When compared to the rest images, there is no  reversibility noted.  The ejection fraction was 79% and the wall motion was  normal.   FINAL INTERPRETATION:  Normal adenosine Myoview with no diagnostic  electrocardiographic changes.  Sonographic results show probable breast  attenuation, but no ischemia.  Ejection fraction was 79% and the wall motion  was normal.       BC/MEDQ  D:  06/16/2005  T:  06/17/2005  Job:  [id number]

## 2011-03-26 NOTE — Letter (Signed)
September 15, 2006    Patrica Duel, M.D.  [address], [address]  [place], [place]. [address]   RE:  [name] [name], [name] [name]  MRN:  [id number]  /  DOB:  22-Feb-1941   Dear Loraine Leriche:   Thank you for the opportunity to see your nice patient, [name] [name],  concerning prolonged chest pain. As you know, she has had recurrent chest  pain in the past. She had a cath in 1991 and again in 2003 revealing no  significant coronary disease.  She had a stress-test in August, 2006  revealing an EF of 79% and no diagnostic EKG changes and no ischemia.   The present symptoms started on Saturday, 5 days ago, then fairly persistent  and seem to be most consistent with musculoskeletal type pain.   She does have some palpitations and I note from the chart that she has had  documented atrial fibrillation back in 2005.   Present therapy includes Toprol XL 50, Nexium 40, Ambien 10, simvastatin.  Apparently she is unable to take aspirin.   EXAMINATION:  VITAL SIGNS:  Blood pressure 110/70, pulse 81, normal sinus  rhythm.  GENERAL APPEARANCE:  Obesity, JVP is not elevated, carotid pulses palpable  without bruit.  LUNGS: Clear.  CARDIAC EXAM: Unremarkable.  EXTREMITIES: No edema.   IMPRESSION:  1. Atypical chest pain.  2. History of paroxysmal atrial fibrillation. Patient continues to have      occasional palpitations.  3. Obesity.  4. No significant coronary disease and negative stress-test.   I have suggested and event monitor to document whether she is having  recurring atrial fibrillation and she may be a candidate for  anticoagulation. Also suggested 2D echocardiogram.   Thank you for the opportunity to share in this nice patient's care.    Sincerely,     ______________________________  E. Graceann Congress, MD, [facility]    EJL/MedQ  DD: 09/15/2006  DT: 09/15/2006  Job #: [id number]

## 2011-03-26 NOTE — Procedures (Signed)
[name] [name], [name] [name] NO.:  [id number]   MEDICAL RECORD NO.:  [id number]          PATIENT TYPE:  INP   LOCATION:  A222                          FACILITY:  [facility]   PHYSICIAN:  Cecil Cranker, M.D.DATE OF BIRTH:  06-27-1941   DATE OF PROCEDURE:  06/16/2005  DATE OF DISCHARGE:                                    STRESS TEST   INDICATIONS FOR PROCEDURE:  [name] is a 70 year old female with known  coronary disease which was not obstructive by catheterization in 2003.  She  presents with atypical chest discomfort.  Cardiac enzymes are negative x3.  She has no acute changes on her electrocardiogram to suggest ischemia.  Cardiac risk factors include family history of diabetes and a remote tobacco  abuse.   BASELINE DATA:  Electrocardiogram reveals a sinus rhythm at 66 beats per  minute with PVC, nonspecific ST abnormalities.  Blood pressure is 130/78.   Adenosine 60 mg was infused over a 4 minute protocol with Myoview injected  at 3 minutes.  The patient had flushed feeling and significant nausea that  resolved in recovery.  Electrocardiogram revealed several blocked atrial  beats, frequent PVCs and no ischemic changes were noted.   IMPRESSION:  Final images and results are pending M.D. review.      [name] [name]   AB/MEDQ  D:  06/16/2005  T:  06/16/2005  Job:  [id number]

## 2011-03-26 NOTE — H&P (Signed)
NAMESHATEKA, PETREA NO.:  [id number]   MEDICAL RECORD NO.:  [id number]          PATIENT TYPE:  INP   LOCATION:  A219                          FACILITY:  [facility]   PHYSICIAN:  Madelin Rear. Sherwood Gambler, MD  DATE OF BIRTH:  Apr 27, 1941   DATE OF ADMISSION:  02/16/2007  DATE OF DISCHARGE:  LH                              HISTORY & PHYSICAL   CHIEF COMPLAINT:  Weakness.   HISTORY OF PRESENT ILLNESS:  The patient had progressive increasing  weakness, fatigue following two days of chills without true rigors.  She  denied cough or increased shortness of breath.  Did feel severe fatigue.  She has had possibly some hematuria associated with these symptoms.  Severity is minor.  Location is urinary as well as generalized  constitutional.  There are no exacerbating or ameliorating conditions or  circumstances.   PAST MEDICAL HISTORY:  Steroid dependent COPD, severe asthma.  She is  recently status post diverticular perforation with surgery performed at  [facility] and is status post colostomy.  She has steroid-induced diabetes  mellitus.  Atrial fibrillation.  Coronary artery disease,  gastroesophageal reflux disease, leukoplakia of right buccal mucosa.   SOCIAL HISTORY:  She does not use any alcohol.  She does not smoke.  No  illicit drug use.   FAMILY HISTORY:  Positive for coronary artery disease and asthma but,  otherwise, contributory.   REVIEW OF SYSTEMS:  HEAD AND NECK:  She denied any headache, blurred  vision or stiff neck.  No epistaxis, no tinnitus or vertigo.  No  dysphagia or odynophagia.  CARDIOVASCULAR:  Under HPI.  No palpitations.  CARDIOPULMONARY:  She denied any cough or sputum, no palpitations.  Please see HPI.  GI:  No nausea, vomiting or diarrhea.  She was not  aware of any melena or hematochezia from the colostomy output.  According to her it has been putting out stool normally.  EXTREMITIES:  Positive for notable swelling recently. ALLERGIES/IMMUNOLOGIC:   She  denied any skin rash.   PHYSICAL EXAMINATION:  GENERAL:  She is pale-appearing with evident  facial telangiectasia.  HEAD AND NECK:  No JVD or adenopathy. Neck supple.  CHEST:  Clear.  CARDIAC:  Irregularly irregular rhythm with good controlled ventricular  response rate.  No gallop or rub.  ABDOMEN:  Soft, colostomy patent.  No evidence of active bleeding in the  bag at present.  No organomegaly or masses.  No guarding or rebound  tenderness.  EXTREMITIES:  No clubbing or cyanosis.  Positive nonpitting edema of the  distal leg.  NEUROLOGICAL:  Grossly nonfocal.   LABORATORY DATA:  Show severe anemia at 7.3 g% hemoglobin, 21.7%  hematocrit.  White count 10.5 with evident left shift, 83 polys,  platelet count normal at 235.  Hemoccult stool was obtained and is  positive from a colostomy.  Electrolytes were concerning for a new onset  of renal failure with BUN 44, creatinine 4.54.  Calcium was normal.  Albumin 2.2.  Liver function tests, otherwise, unrevealing.  Her  urinalysis revealed 7-10 white cells per high power field and severe  hematuria with too  numerous to count red cells.  Cultures are pending at  the present time of her urine.   Radiographs not available to me at the time will be reviewed when they  are available.   IMPRESSION:  1. The patient presents with severe anemia, hematuria, and pyuria.      Positive Hemoccult stools.  Anemia may be multifactorial, but for      the time being she certainly needs to be admitted for transfusion.      Will begin parenteral antibiotics respecting her allergies.  We use      broad spectrum coverage with Primaxin pending outcomes of blood      culture, radiograph evaluation, and urine culture.  2. Gastrointestinal bleed.  Consult GI.  Institute high dose proton      pump inhibitor parenterally for any stress ulcerations.  3. Steroid-dependent asthma and chronic obstructive pulmonary disease.      The patient is not hypotensive  nor hyperkalemic nor hyponatremic to      suggest any Addison's, but I will cover with some steroids at      present anyway.  Will of course avoid any Lovenox or other      probleeding measures for deep venous thrombosis prophylaxis with      her active gastrointestinal bleed and severe anemia evident.  4. Previous steroid-induced diabetes.  Will go ahead and cover her      blood sugars with a moderate sensitivity sliding scale.  5. Renal failure new onset.  May be prerenal, but will evaluate her      kidneys and also have renal consultation with Dr. Kristian Covey.      Madelin Rear. Sherwood Gambler, MD  Electronically Signed     LJF/MEDQ  D:  02/16/2007  T:  02/16/2007  Job:  [id number]

## 2011-03-26 NOTE — Procedures (Signed)
[name] [name], [name] [name] NO.:  [id number]   MEDICAL RECORD NO.:  [id number]          PATIENT TYPE:  OUT   LOCATION:  RAD                           FACILITY:  [facility]   PHYSICIAN:  Gerrit Friends. Dietrich Pates, MD, FACCDATE OF BIRTH:  1941/08/05   DATE OF PROCEDURE:  09/22/2006  DATE OF DISCHARGE:                                  ECHOCARDIOGRAM   REFERRING PHYSICIANS:  1. Patrica Duel, M.D.  2. Cecil Cranker, MD, [facility]   CLINICAL DATA:  A 70 year old woman with atrial fibrillation and diabetes.   M-MODE:  Aorta 2.4, left atrium 5.2, septum 1.4, posterior wall 1.4, LV  diastole 3.9, LV systole 2.8.   IMPRESSION:  1. Technically suboptimal and limited echocardiographic study.  2. Left atrium is significantly enlarged.  The right atrium is not well      seen, but is apparently normal in size.  3. Normal right ventricular size and function; moderate hypertrophy.  4. Normal aortic root diameter with mild calcification of the wall.  5. Aortic valve not ideally imaged.  There is a slight increase in      velocity across the valve, which may represent trivial, if any,      stenosis.  No aortic insufficiency identified.  6. Mitral valve not well seen; no regurgitation identified.  7. Tricuspid valve, pulmonic valve and proximal pulmonary artery      inadequately imaged.  8. Normal left ventricular size; not all segments adequately imaged - no      areas of akinesis or dyskinesis.  Hypertrophy is present.  Overall left      ventricular systolic function appears normal.  9. Normal inferior vena cava.      Gerrit Friends. Dietrich Pates, MD, [facility]  Electronically Signed     RMR/MEDQ  D:  09/22/2006  T:  09/23/2006  Job:  [id number]

## 2011-03-26 NOTE — Discharge Summary (Signed)
NAMEZOIEY, CHRISTY NO.:  [id number]   MEDICAL RECORD NO.:  [id number]          PATIENT TYPE:  INP   LOCATION:  A219                          FACILITY:  [facility]   PHYSICIAN:  Madelin Rear. Sherwood Gambler, MD  DATE OF BIRTH:  22-Mar-1941   DATE OF ADMISSION:  02/16/2007  DATE OF DISCHARGE:  04/18/2008LH                               DISCHARGE SUMMARY   DISCHARGE DIAGNOSES:  1. Severe anemia secondary to multifactorial reasons including      gastrointestinal bleed.  2. Steroid-dependent asthma and chronic obstructive pulmonary disease,      severe.  3. The patient also has previous steroid-induced diabetes mellitus.  4. New-onset renal failure.   DISCHARGE MEDICATIONS:  Xanax, metoprolol, Ambien, Nexium, Combivent,  Singulair, Avelox, potassium, Lasix, Medrol Dosepak.   SUMMARY:  The patient was admitted with severe anemia, evident GI  bleeding and mild exacerbation of COPD.  IV hydration and consultation  by Renal resulted in improvement in her renal function, which appeared  to be mostly prerenal azotemia.  She may have had a little bit of acute  tubular necrosis superimposed on this, although this is not certain at  present.  She responded to transfusion.  She was also seen in  consultation by GI and subsequent discharged in stable condition.   FOLLOWUP:  In office.      Madelin Rear. Sherwood Gambler, MD  Electronically Signed     LJF/MEDQ  D:  03/10/2007  T:  03/10/2007  Job:  [id number]

## 2011-03-26 NOTE — Discharge Summary (Signed)
[name] [name], WARMAN                      ACCOUNT NO.:  [id number]   MEDICAL RECORD NO.:  [id number]                   PATIENT TYPE:  INP   LOCATION:  3737                                 FACILITY:  [facility]   PHYSICIAN:  Cristy Hilts. Jacinto Halim, M.D.                  DATE OF BIRTH:  1941/10/23   DATE OF ADMISSION:  07/02/2002  DATE OF DISCHARGE:  07/04/2002                                 DISCHARGE SUMMARY   DISCHARGE DIAGNOSES:  1. Chest pain, etiology not determined, status post coronary angiography     with essentially clear coronary arteries.  2. Possible gastroesophageal reflux disease and/or esophageal spasm.  3. Remote hiatal hernia repair [id number] .  4. Remote cholecystectomy.  5. Severe morbid obesity.  6. Osteoarthritis.  7. Bilateral cataracts.  8. Adult-onset diabetes mellitus.  9. Prior cerebrovascular accident.  10.      Status post remote hysterectomy.  11.      History of palpitations.   HISTORY OF PRESENT ILLNESS AND HOSPITAL COURSE:  The patient is a 70-year-  old patient of Dr. Geanie Logan who had a remote history of catheterization  without significant coronary artery disease over a decade ago at [facility] whose records were not available developed an episode of chest pain  that was felt to be compatible with ischemia.  She was started on IV  nitroglycerin and heparin on admission and on July 02, 2002; underwent  coronary angiography on July 03, 2002 performed by Dr. Alanda Amass.   The procedure revealed normal left main coronary artery, left anterior  descending artery had 20-30% minimal narrowing, the first diagonal branch  was normal, the circumflex was nondominant and moderate size with normal  marginal 1, moderately enlarged marginal 2 and moderate-sized obtuse  marginal 3, all vessels normal.  Right coronary artery was a dominant vessel  and there was no ostial or proximal stenosis and dilatation went well  without any reformation.  Left  ventricular angiogram was performed and  showed normal left ventricular function with 60% ejection fraction and  angiographic evidence of left ventricular hypertrophy.  Abdominal aortic  angiogram showed normal superior mesenteric artery and celiac,  normal  single right coronary artery and two normal left renal arteries.  The  patient tolerated the procedure well and was transferred to the holding area  in stable condition and then later returned to her room.   The next morning on July 03, 2002 she was seen by Dr. Jacinto Halim and her blood  pressure was 124/80, pulse 72, she remained in sinus rhythm, She did not  present any complaints of chest pain, no shortness of breath and was deemed  to be stable for discharge home.   EKG on admission did not show any significant changes but one slightly  elevated ST in 3 and aVF which could be considered as a possible inferior MI  and which prompted her catheterization.  Her renal function was fine.  BMP the day of discharge showed potassium 4.4,  sodium 140, chloride 106, carbon dioxide 29, BUN 16, creatinine 0.9, and  glucose 101.  Hemoglobin was 11.9, hematocrit 35.3, and platelet count 230.   DISPOSITION:  The patient was discharged home in stable condition free of  pain.   DISCHARGE INSTRUCTIONS:  She was instructed not to drive or pick up or lift  any heavy objects greater than five pounds and avoid strenuous physical  activity x3 days postcatheterization.  She is to stay on low-cholesterol,  low-carbohydrate diet.  She is to report any bleeding, oozing, or any other  problems with groin site to our office and the phone number was provided.   FOLLOWUP:  A followup appointment was scheduled with Dr. Domingo Sep at the  [place] office on July 19, 2002 at 11:20 a.m.     Raymon Mutton, P.A.-C                  Cristy Hilts. Jacinto Halim, M.D.    MK/MEDQ  D:  07/11/2002  T:  07/12/2002  Job:  [id number]   cc:   [facility]  [place]  [address]  [place] [address]   [facility] [place]  [address]  [place], [place] [address]

## 2011-03-26 NOTE — Discharge Summary (Signed)
NAMEMEREDITH, KILBRIDE NO.:  [id number]   MEDICAL RECORD NO.:  [id number]          PATIENT TYPE:  INP   LOCATION:  A306                          FACILITY:  [facility]   PHYSICIAN:  Kirk Ruths, M.D.DATE OF BIRTH:  1941-09-07   DATE OF ADMISSION:  12/10/2006  DATE OF DISCHARGE:  02/09/2008LH                               DISCHARGE SUMMARY   DATE OF TRANSFER:  December 17, 2006   DISCHARGE DIAGNOSIS:  1. Status asthmaticus.  2. Perforated colon secondary to diverticulitis.  3. Type 2 diabetes.  4. History of severe chronic obstructive pulmonary disease and asthma.  5. History of hypertension.  6. Prior history of bipolar disorder.   HOSPITAL COURSE:  This is a 70 year old female who has longstanding COPD  with severe asthma.  She is steroid dependent; and has steroid induced  diabetes.  She was admitted through the emergency room with status  asthmaticus refractory to nebs and IV steroids.  She was admitted to the  floor and continued on IV steroids, IV nebs, as well as IV antibiotics.  Her chest x-ray Initially consistent with asthma without evidence of the  pneumonia.  The patient's blood sugars were somewhat exacerbated during  her stay due to steroids.  She was covered with sliding scale of  insulin.   The patient also has chronic back pain and was treated for her back pain  during her stay.  The patient improved very slowly, having one episode  of some chest pain where her EKG and enzymes were negative.  Repeated  chest x-rays, again, showed no significant change, and no evidence of  infiltrates.  Her steroids were decreased.  She was placed on oral  prednisone.  And then, abruptly on the February 9th she reported pain in  her left lower quadrant CT scan was immediately ordered, as well as a  CBC showing a white count of 13,000, hemoglobin 12.1.  CT showed  perforated colon secondary acute diverticulitis.  Surgical consult was  promptly obtained.  Dr.  Barbaraann Barthel, who evaluated the case, felt  that the patient's surgery would be better performed at [facility]  due to her multiple medical problems, including steroid-dependent  respiratory disease.  She was promptly transferred to [facility].      Kirk Ruths, M.D.  Electronically Signed     WMM/MEDQ  D:  12/31/2006  T:  12/31/2006  Job:  [id number]

## 2011-03-26 NOTE — H&P (Signed)
[name], [name] NO.:  [id number]   MEDICAL RECORD NO.:  [id number]          PATIENT TYPE:  INP   LOCATION:  A306                          FACILITY:  [facility]   PHYSICIAN:  Kirk Ruths, M.D.DATE OF BIRTH:  August 10, 1941   DATE OF ADMISSION:  12/10/2006  DATE OF DISCHARGE:  LH                              HISTORY & PHYSICAL   CHIEF COMPLAINT:  Shortness of breath.   HISTORY OF PRESENT ILLNESS:  The patient is a 70 year old female with  prolonged history of COPD with asthma, who presents to the emergency  room with a 2 day history of progressive shortness of breath and  wheezing. In the emergency room, the patient was refractory to  nebulizers with albuterol, Atrovent, and will be admitted for intensive  respiratory treatment, IV steroids, etc. The patient's x-ray report is  normal, without pneumonia. White count was 5,000.   ALLERGIES:  PENICILLIN, HYDROCODONE.   CURRENT MEDICATIONS:  Xanax, Lopressor 50 mg daily, __________ 2  mg at  bedtime. The patient has a history of hypertension, diabetes, and  bipolar disorder. She says that she is not on medications at this time  for any of those problems, except possibly the blood pressure. In the  past, she has been on Diovan, Glucovance, Lithium, Effexor, Clonidine  and Vytorin.   REVIEW OF SYSTEMS:  She denies chest pain, nausea, vomiting, and  diarrhea.   PHYSICAL EXAMINATION:  VITAL SIGNS:  An elderly female who is somewhat  uncomfortable, respiratory-wise. She is afebrile. Her blood pressure is  150/80. Pulse is 100 and regular. Respiratory rate 24 and unlabored.  HEENT:  TM's normal. Pupils are equal, round, and reactive to light and  accommodation. Oropharynx benign.  NECK:  Supple without JVD, bruit or thyromegaly.  LUNGS:  With diffuse expiratory wheezes.  HEART:  Regular rate and rhythm. No murmur, rub, or gallop.  ABDOMEN:  Soft, nontender.  EXTREMITIES:  Without clubbing, cyanosis, or edema.  NEUROLOGIC:  Examination is grossly intact.   ASSESSMENT:  1. Status asthmaticus.  2. Chronic obstructive pulmonary disease.  3. History of hypertension.  4. History of type 2 diabetes.  5. History of bipolar.      Kirk Ruths, M.D.  Electronically Signed     WMM/MEDQ  D:  12/11/2006  T:  12/11/2006  Job:  [id number]

## 2011-03-26 NOTE — Discharge Summary (Signed)
[name] [name], ECKERT NO.:  [id number]   MEDICAL RECORD NO.:  [id number]          PATIENT TYPE:  INP   LOCATION:  A222                          FACILITY:  [facility]   PHYSICIAN:  Madelin Rear. Sherwood Gambler, MD  DATE OF BIRTH:  October 27, 1941   DATE OF ADMISSION:  06/15/2005  DATE OF DISCHARGE:  08/09/2006LH                                 DISCHARGE SUMMARY   DISCHARGE MEDICATIONS:  1.  Simvastatin 20 mg p.o. daily.  2.  Toprol-XL 50 mg p.o. daily.  3.  Ambien 10 mg p.o. q.h.s. p.r.n.  4.  Nexium 40 mg p.o. daily.  5.  Xanax 0.25 mg p.o. q.i.d. p.r.n.  6.  Lorcet plus, 1 or 2 q.6h. p.r.n.  7.  Zithromax 500 mg p.o. daily.  8.  Keflex 500 mg p.o. q.i.d.  9.  Xopenex 1.25 mg neb q.i.d.   DISCHARGE DIAGNOSIS:  1.  Gastroesophageal reflux disease.  2.  Syncope.  3.  Asthma.  4.  Coronary artery disease.  5.  Status post cerebrovascular accident.  6.  Diverticulitis.  7.  Right buccal mucosa leukoplakia.  8.  Diabetes mellitus type 2.  9.  Chronic pain syndrome.  10. Hypertension.   SUMMARY:  The patient was admitted with syncope with multiple comorbidities.  Was seen in consultation by cardiology who accomplished an adenosine Myoview  which was negative for ischemia.  Ejection fraction was 79%, wall motion was  noted to be normal by Dr. Dietrich Pates.  He had incidental CT spiral of the  chest to rule out pulmonary embolus per protocol and was negative in house.  Chest pain on admission that was mentioned turned out to be atypical in  nature.  As above, workup and enzymes were negative and the patient was  discharged in stable for followup with cardiology as well as in our office.     Madelin Rear. Sherwood Gambler, MD  Electronically Signed    LJF/MEDQ  D:  07/11/2005  T:  07/11/2005  Job:  [id number]

## 2011-03-26 NOTE — Consult Note (Signed)
[name] [name], [name] [name] NO.:  [id number]   MEDICAL RECORD NO.:  [id number]          PATIENT TYPE:  INP   LOCATION:  A219                          FACILITY:  [facility]   PHYSICIAN:  Kassie Mends, M.D.      DATE OF BIRTH:  [birth year]   DATE OF CONSULTATION:  02/16/2007  DATE OF DISCHARGE:                                 CONSULTATION   STAT CONSULTATION   REASON FOR CONSULTATION:  GI bleed.   HISTORY OF PRESENT ILLNESS:  [name] [name] is a 70 year old female, who  was admitted to the hospital complaining of blood in her urine.  She has  a past medical history of perforated colon secondary to diverticulitis  in February of 2008.  She reports blood in her urine, burning with  urination, and lower abdominal pain for three weeks.  She denies any  problem swallowing, or heartburn and indigestion.  She complains of  having problems with decreased appetite since February of 2008.  She  states that she has been losing weight.  However, she weighed 99.6 kg in  February and weighs 101 kg on today.  She has had liquid brown stool in  her bag.  She reports only changing her bag every four days.  She denies  any black stool in her bag or blood in her bag.  She has yellow, jelly  stuff coming from her rectum.  She denies any blood from her rectum.  She denies any bloody nose.  She has had no rash or sores in her mouth.  She has not been coughing up any blood.  She denies any upper abdominal  pain.  She does report feeling gaggy.   PAST MEDICAL HISTORY:  1. Asthma.  2. COPD.  3. Hypertension.  4. Bipolar disorder.   PAST SURGICAL HISTORY:  1. Diverting colostomy in February of 2008.  2. Cholecystectomy.  3. Total abdominal hysterectomy.  4. Open hiatal hernia repair.   ALLERGIES:  1. PENICILLIN.  2. SULFA.  3. DARVOCET.  4. ASPIRIN.  5. TETRACYCLINE.  6. DEMEROL.  7. CIPRO.  8. ZOCOR.   MEDICATIONS:  Lasix, Imipenem, NovoLog sliding scale, Solu-Medrol,  Lopressor,  Singulair, Spiriva.   FAMILY HISTORY:  She has a family history of coronary artery disease and  hypertension.   SOCIAL HISTORY:  She does not smoke or drink.  She lives with her  family.   REVIEW OF SYSTEMS:  She has never had a colonoscopy.  Otherwise, her  review of systems is per the HPI.  Otherwise all systems are negative.   PHYSICAL EXAMINATION:  VITAL SIGNS:  Her temperature is 98.5, blood  pressure 146/76, pulse 106, respiratory rate 20.GENERAL:  She is in no  apparent distress, alert and oriented x4.HEENT:  Atraumatic,  normocephalic.  Pupils are equal and react to light.  Mouth:  No oral  lesions.  Posterior pharynx without erythema or exudate.NECK:  Full  range of motion and no lymphadenopathy.  LUNGS:  Clear to auscultation bilaterally.CARDIOVASCULAR:  Regular  rhythm, no murmur, normal S1 and S2.  ABDOMEN:  Bowel sounds are present, soft, she has mild  to moderate  tenderness to palpation in the right lower quadrant and the left upper  quadrant.  She has a dry dressing in the midline of the lower abdomen.  She has a colostomy bag in the left lower quadrant with light-brown  liquid material contained within the bag.  She has some formed stool at  the ostomy site.  She has no rebound or guarding.  Her abdomen is  obese.EXTREMITIES:  No cyanosis or clubbing.  She has trace edema.  NEURO:  She has no focal neurologic deficits.   LABORATORY DATA:  White count 10.5, hemoglobin 7.3, platelets 235,  potassium 3.5, BUN 44, creatinine 4.5, alk-phos 69, AST 8, ALT 9,  albumin 2.2.  UA:  Specific gravity of 1.025, bilirubin is small, blood  large, protein greater than 300, nitrate positive, squamous cells few,  WBCs 7 to 10, bacteria few, hemoccult positive.   ASSESSMENT:  [name] [name] is a 70 year old female, who was admitted with  three weeks of hematuria, nausea, and dysuria, now with acute renal  failure and evidence of urinary tract infection.  She has no overt  evidence of  gastrointestinal bleed (hematemesis, melena, or  hematochezia).  She has a normocytic anemia and hemoccult-positive  stool.  She has never had a colonoscopy.  The differential diagnosis  includes colorectal polyp and gastritis.  Thank you for allowing me to  see [name] [name] in consultation.  My recommendations follow.   RECOMMENDATIONS:  1. She has no acute indication for endoscopy.  2. I will check a ferritin level and agree with transfusion.  3. I would add Protonix daily.  4. I would obtain a discharge summary from [facility] of the      February of 2008 admission and the operative note.  5. Will follow on a daily basis and if she manifests any evidence of      active GI bleed then would proceed with upper endoscopy or      colonoscopy.      Kassie Mends, M.D.  Electronically Signed     SM/MEDQ  D:  02/16/2007  T:  02/16/2007  Job:  [id number]

## 2011-03-26 NOTE — Discharge Summary (Signed)
[name] [name], [name] NO.:  [id number]   MEDICAL RECORD NO.:  [id number]          PATIENT TYPE:  INP   LOCATION:  A202                          FACILITY:  [facility]   PHYSICIAN:  Corrie Mckusick, M.D.  DATE OF BIRTH:  [birth year]   DATE OF ADMISSION:  08/14/2005  DATE OF DISCHARGE:  10/12/2006LH                                 DISCHARGE SUMMARY   DISCHARGE DIAGNOSIS:  Diverticulitis.   For history of present illness and past medical history, please see  admission H&P.   HOSPITAL COURSE:  A 70 year old female with history of coronary artery  disease, CVA, diverticulosis, asthma, GERD, diabetes and hypertension who  presents with left lower quadrant pain consistent with diverticulitis. There  is a questionable possible abscess, and Dr. Katrinka Blazing was consulted. She loads  of allergies, so she was not able to use Cipro. We actually used gentamicin  and imipenem after starting her on Flagyl and Bactrim. She improved greatly  from her moment of admission on the p.o. Bactrim as well as the IV Flagyl.   The patient improved to almost 75% after the first day. She had no fevers  and was remarkably doing well. We continued her IV antibiotics for the next  few days. Again, she continued to improve.   She was ready to eat on the 10th, and we started her on clear liquids and  advanced as tolerated. We continued her on IV antibiotics for that day as  well.   She was ready to transition to p.o. antibiotics on 11th which she did  transition to Flagyl 500 t.i.d. as well as Bactrim DS 500 b.i.d. She was  sent home on this as well.   DISCHARGE PHYSICAL EXAMINATION:  VITAL SIGNS:  T-max 97.6, blood pressure  140s/80s, heart rate in the 70s, respiratory rate in the 20s.  GENERAL:  Pleasant female in no acute distress.  HEENT:  Nasopharynx clear with moist mucous membranes.  NECK:  Supple.  CHEST:  Clear to auscultation bilaterally.  CARDIOVASCULAR:  Regular rate and rhythm without  murmurs.  ABDOMEN:  Bowel sounds positive, soft, nontender, nondistended.   DISCHARGE MEDICATIONS:  1.  Bactrim DS b.i.d. for 7 days.  2.  Flagyl 500 t.i.d. for 7 days.  3.  Metoprolol XL 50 mg p.o. daily.  4.  Protonix 40 mg p.o. b.i.d.  5.  Zocor 20 mg p.o. q.p.m.  6.  Xanax 0.5 mg p.o. p.r.n.   DISCHARGE CONDITION:  Improved and stable.   DISCHARGE FOLLOWUP:  In one week with [place]. I strongly encouraged the  patient if she gets any increase in pain or recurrence of pain at all she is  to call and return immediately.      Corrie Mckusick, M.D.  Electronically Signed     JCG/MEDQ  D:  08/19/2005  T:  08/19/2005  Job:  [id number]

## 2011-03-26 NOTE — Op Note (Signed)
[name] [name], [name] [name] NO.:  [id number]   MEDICAL RECORD NO.:  [id number]                   PATIENT TYPE:  AMB   LOCATION:  DSC                                  FACILITY:  [facility]   PHYSICIAN:  Suzanna Obey, MD                      DATE OF BIRTH:  1941/05/29   DATE OF PROCEDURE:  09/06/2002  DATE OF DISCHARGE:                                 OPERATIVE REPORT   PREOPERATIVE DIAGNOSIS:  Oral leukoplakia.   POSTOPERATIVE DIAGNOSIS:  Oral leukoplakia.   PROCEDURE:  Laser excision and ablation of oral leukoplakia.   ANESTHESIA:  General endotracheal tube.   ESTIMATED BLOOD LOSS:  Less than 1 cc.   INDICATIONS:  A 70 year old who is a chronic snuff user and has a  significant amount of very concerning-appearing leukoplakia all along her  right buccal mucosa and retromolar trigone.  It extends up to her anterior  commissure.  It is very verrucous-appearing.  She was informed of the risks  and benefits of the laser excision, including bleeding, infection, scarring,  need for possible additional surgery or treatment, very sore mouth, and risk  of the anesthetic.  All questions were answered and consent was obtained.   DESCRIPTION OF PROCEDURE:  The patient was taken to the operating room and  placed in the supine position after adequate general endotracheal tube  anesthesia with a laser endotracheal tube.  She was covered with wet towels  circumferentially around the mouth.  The leukoplakia was then examined with  retractors and the anterior aspect was begun, making a laser incision right  at the edge and very thinly removing the mucosal surface and extending back  to the midportion of this leukoplakia.  This was sent for permanent section.  The posterior aspect as well as along the commissure was removed, again the  same technique with a superficial layer excision with the layer, removing it  with careful dissection.  This was also sent as a posterior  margin of  resection.  The entire area was approximately a 2 x 2 cm area.  The  remaining leukoplakia, which was along the buccal mucosa and the retromolar  trigone, was not as verrucous-appearing and was lasered in the defocus mode  with 6 power, and the superficial layer was burned away.  She then had good  hemostasis, and there was no additional leukoplakia that could be observed.  She was then awakened, brought to the recovery room in stable condition with  counts correct.                                               Suzanna Obey, MD    JB/MEDQ  D:  09/06/2002  T:  09/06/2002  Job:  [id number]  cc:   Patrica Duel, MD  [address]  [place]  [place] [address]  Fax: [phone]

## 2011-03-26 NOTE — Consult Note (Signed)
NAMEQUEENIE, AUFIERO NO.:  [id number]   MEDICAL RECORD NO.:  [id number]          PATIENT TYPE:  INP   LOCATION:  A306                          FACILITY:  [facility]   PHYSICIAN:  Barbaraann Barthel, M.D. DATE OF BIRTH:  22-Oct-1941   DATE OF CONSULTATION:  12/17/2006  DATE OF DISCHARGE:                                 CONSULTATION   NOTE:  Surgery was asked to see this 70 year old female for abdominal  pain and sigmoid perforation.   HISTORY OF PRESENT MEDICAL ILLNESS:  This patient was admitted a week  ago to the hospital to stabilize her severe status asthmaticus and  developed, over the last 24 or 48 hours, abdominal pain that increased.  She had a CT scan today which revealed a sigmoid perforation with free  air.  She has been on steroids for her asthma and she is also diabetic  and obese.  Other medical problems include hypertension, history of  coronary artery disease with three catheterizations in the past, bipolar  disorder, and hypertension.   PHYSICAL EXAMINATION:  GENERAL:  An obese white female who is not moving  around without discomfort.  VITAL SIGNS:  She is afebrile with temperature 98, heart rate 91 per  minute, blood pressure 125/60, respirations 22 per minute.  She is 5  feet 2 inches and weighs greater than 90 kg.  HEENT:  Head is normocephalic.  Extraocular movements are intact.  Pupils were round and react to light and accommodation.  There is some  conjunctive pallor.  Nasal and oral mucosa is moist.  NECK:  The neck is short. There are there are no bruits auscultated.  No  adenopathy and no jugular vein distension or thyromegaly.  CHEST:  The patient has bilateral wheezes.  HEART:  Rate is regular, somewhat tachy.  BREASTS AND AXILLA:  Without masses.  ABDOMEN:  A right subcostal incision consistent with a previous  cholecystectomy and midline incision which was for a Nissen procedure,  an infraumbilical midline incision for hysterectomy.   She is acutely  tender in the left lower quadrant and the left iliac fossa. Bowel sounds  are diminished.  She has no femoral or inguinal herniae appreciated.  RECTAL:  Her stool is guaiac negative.  EXTREMITIES:  +1 pretibial edema.  Palpable femoral extremity and I do  not palpate a popliteal or dorsalis pedis.   REVIEW OF SYSTEMS:  GI SYSTEM:  The patient has no past history of  hepatitis.  She has had a Nissen fundoplication. She does not admit to  any previous episodes of diverticular disease.  No past history of  hepatitis.  She had normal stool yesterday.  No history of bright red  rectal bleeding, black tarry stools, diarrhea or unexplained weight  loss.  GU SYSTEM:  No history of nephrolithiasis.  She has had some  dysuria.  This is likely secondary to bladder irritation from her a  sigmoid perforation.  This has been recent.  CARDIORESPIRATORY SYSTEM:  The patient states that she may have had a heart attack 20 years ago.  She has had three cardiac catheterizations, the last one  being  approximately five years ago.  She is hypertensive and she is a severe  asthmatic.  She does not smoke or drink.  She used to smoke in the past  but has not smoked for many years.  OB/GYN HISTORY:  The patient is a  gravida 5, abortus 0, cesarean 0, female with no family history of  carcinoma of the breast and she has never had a mammogram.  She has had  a TAH and unilateral salpingo-oophorectomy.  MUSCULOSKELETAL SYSTEM:  The patient is obese.  ENDOCRINE SYSTEM:  She is a type 2 diabetic.   MEDICATIONS:  She has currently been placed on Cipro which initially she  was thought to be allergic to but this was an error and she is on Cipro.  She has also been placed on Flagyl.  She has been taking prednisone and  Xanax and she has been on IV Solu-Medrol.  She has been taking Glucotrol  for her diabetes and also been followed by sliding scale and she has  been on Nexium, as well.  She has also been  taking metoprolol 50 mg  daily and been taking hydrocodone and acetaminophen and Zolpidem and  alprazolam 0.5 mg t.i.d.  The Zolpidem has been for hour of sleep.   ALLERGIES:  TETRACYCLINE, PENICILLIN, SULFA, DARVOCET, VICODIN, LORTAB,  ASPIRIN, AND DEMEROL.   LABORATORY DATA:  CT scan as mentioned above with perforation.  Her EKG  shows a sinus rhythm with frequent premature complexes and low voltage  noted.  White count is 13.3 with an H&H of 12.1 and 36.2, with 92%  neutrophils noted.  Electrolytes are grossly within normal limits with  blood sugar 103 and a BUN of 18 and creatinine 0.8.  Liver function  studies are all grossly within normal limits. Urinalysis also grossly  within normal limits. Her last chest x-ray shows mild cardiac  enlargement and bronchitic changes and scattered atelectasis with no  acute abnormality noted at present, this was on February 6, and changes  significant for COPD   IMPRESSION:  1. Diverticulitis with perforation.  2. Coronary artery disease.  3. Hypertension.  4. Diabetes mellitus type 2.  5. Bipolar disorder.  6. Obesity.  7. Asthma.   PLAN:  I have discussed this with Dr. Nobie Putnam who plans to transfer her  to [facility], the reasoning being that to follow her clinically is  difficult because of her steroid dependencies for her asthma and the  likelihood that coming to surgery, she will become long term ventilator  dependent and not compatible with the services of care we can render  here on a long term basis.  This was discussed in detail with Dr.  Nobie Putnam who will make arrangements for transfer.      Barbaraann Barthel, M.D.  Electronically Signed     WB/MEDQ  D:  12/17/2006  T:  12/17/2006  Job:  [id number]   cc:   Patrica Duel, M.D.  Fax: [phone]

## 2011-03-26 NOTE — Cardiovascular Report (Signed)
[name] [name], [name] [name]                      ACCOUNT NO.:  [id number]   MEDICAL RECORD NO.:  [id number]                   PATIENT TYPE:  INP   LOCATION:  3737                                 FACILITY:  [facility]   PHYSICIAN:  Richard A. Alanda Amass, M.D.          DATE OF BIRTH:  03-May-1941   DATE OF PROCEDURE:  07/03/2002  DATE OF DISCHARGE:                              CARDIAC CATHETERIZATION   PROCEDURE:  1. Retrograde central aortic catheterization.  2. Selective coronary angiography by Judkins technique.  3. Left ventricular angiogram, RAO/LAO projection.  4. Abdominal aortic angiogram, midstream PA projection.   DESCRIPTION OF PROCEDURE:  The patient was brought to the second floor CP  lab in a postabsorptive state after 5 mg Valium p.o. premedication.  Heparin  was on hold.  She was on IV nitroglycerin.  The right groin was prepped,  draped in the usual manner.  Xylocaine, 1%, was used for local anesthesia.  The patient was quite agitated in the laboratory and was given 2 mg of  Nubain for sedation and 2 mg of Versed.  The RFA was entered with an  anterior puncture using a #18 thin-walled needle and a #6 [place] short Daig  sidearm sheath was inserted without difficulty.  Catheterization was  performed with #6 French 4-cm taper, preformed Cordis coronary, and pigtail  catheters.  LV angiogram was done in the RAO and LAO projection with a  pigtail #6 French catheter at 25 cc/14 cc per second and 20 cc/12 cc per  second, respectively.  Pullback pressures here were performed and showed no  gradient across the aortic valve.  Abdominal aortic angiogram was done in  the midstream PA projection at 25 cc/20 cc per second because of the  patient's systemic hypertension.  Catheters were removed.  Sidearm sheath  was flushed.  The patient was brought to the holding area for ACT  measurement, sheath removal, and pressure hemostasis in stable condition.   PRESSURES:  1. LV:  150/0; LVEDP 18  mmHg.  2. CA:  150/80 mmHg.  3. There was no gradient across the aortic valve on catheter pullback.   ANGIOGRAPHY:  Fluoroscopy did not reveal any significant coronary,  intracardiac, or valvular calcification.  There was a prosthetic ring in  place in the area of the stomach and distal esophagus, presumably from the  patient's prior hiatal hernia surgery he had done at [facility] several years  ago.  1. The main left coronary was normal.  2. The left anterior descending artery had minor irregularity with less than     20-30% minimal narrowing beyond the first diagonal branch.  The distal     third of the LAD was mildly irregular but no significant stenosis or     atherosclerotic lesions with good flow to the apex where it bifurcated.  3. The first diagonal branch arose before SP-1 and 2 bifurcated and it was     normal.  4.  The circumflex was nondominant but moderate sized.  There was a small OM-     1 that was normal and moderately large OM-2 that bifurcated and was     normal, a moderate sized OM-3 that was normal.  Beyond this, there was a     40-50% narrowing at the PABG branch which was small and bifurcated.  5. The right coronary was a dominant vessel.  There was dilatation a     centimeter after the proximal portion of the ostia.  There was no ostial     or proximal stenosis and the dilatation was mild without aneurysm     formation.  The remainder of the vessel was widely patent and smooth with     a small but normal PDA and PLA and normal RV branches.  6. LV angiogram in the RAO and LAO projection showed a normally contracting     ventricle which was vigorous, EF approximately 60% or greater with     angiographic LVH.  There was no mitral regurgitation.  7. Abdominal aortic angiogram in the midstream PA projection showed a normal     SMA and celiac access, normal single right renal artery, and dual normal     left renal arteries.  The infrarenal abdominal aorta was widely  patent     with no stenosis or aneurysm formation and no significant irregularity.     The common iliacs were normal.  The proximal external iliacs were normal,     mildly tortuous, and the hypogastrics were intact with good runoff     bilaterally.   DISCUSSION:  This 70 year old patient of Dr. Nobie Putnam has a remote history  of catheterization without significant coronary disease over a decade ago at  [facility] but these records are not available.  She has morbid obesity, a history  of diabetes, past CVAs, nonsmoker with COPD and asthma, remote  cholecystectomy, and remote hiatal hernia repair presumably for hiatal  hernia and GI bleeding several years ago at [place].  She is admitted now  with chest pain felt to be compatible with ischemia.  Diagnostic  catheterization shows no significant coronary artery disease.  There is mild  dilatation in the proximal third of the RCA but no aneurysm formation and no  significant stenosis.  There is less than 50% lesion of the distal  circumflex, good normal LV function and mild hypertension.   RECOMMENDATIONS:  I would recommend medical therapy.  She will probably need  GI evaluation with her history of chest pain and past GI disease.   CATHETERIZATION DIAGNOSES:  1. Chest pain etiology not determined.  2. Possible gastroesophageal reflux disease and/or esophageal spasm.  3. Remote hiatal hernia repair, [place] [facility].  4. Remote cholecystectomy.  5. Severe morbid obesity.  6. Osteoarthritis.  7. Bilateral cataracts.  8. Adult onset diabetes mellitus.  9. Prior cerebrovascular accidents.  10.      Remote hysterectomy.  11.      History of palpitations.                                               Richard A. Alanda Amass, M.D.    RAW/MEDQ  D:  07/03/2002  T:  07/03/2002  Job:  [id number]   cc:   Sherral Hammers, M.D.   Jonell Cluck, M.D.  [address]  [place]  [place] [address]  Fax: [phone]   Cardiac Catheterization Lab    Richard A. Alanda Amass, M.D.  [phone] [address]  [place]  [place] [address]  Fax: [phone]   Record Room

## 2011-03-26 NOTE — H&P (Signed)
NAMESTACI, DACK NO.:  [id number]   MEDICAL RECORD NO.:  [id number]           PATIENT TYPE:  INP   LOCATION:  A222                          FACILITY:  [facility]   PHYSICIAN:  Madelin Rear. Sherwood Gambler, MD  DATE OF BIRTH:  [birth year]   DATE OF ADMISSION:  DATE OF DISCHARGE:  LH                                HISTORY & PHYSICAL   CHIEF COMPLAINT:  Syncope.   HISTORY OF PRESENT ILLNESS:  The patient has had shortness of breath,  atypical chest pain, and subsequently while waiting in a hot car developed a  near syncopal episode followed by cold clammy sweat. She complains of  persistent malaise, but no chest pain this morning. She admits to mild  shortness of breath, mild cough nonproductive. She has had no palpitations,  had no recurrence of the syncope. She denies any hematemesis, hematochezia,  or melena.   PAST MEDICAL HISTORY:  1.  Gastroesophageal reflux disease.  2.  Asthma.  3.  Coronary artery disease.  4.  CVA.  5.  Diverticulitis.  6.  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  7.  Diabetes mellitus type 2 dietarily regulated.  8.  Chronic recurrent pain.  9.  She has also had previous hypertension.   SOCIAL HISTORY:  She does not smoke, use alcohol, or other illicit drugs.   FAMILY HISTORY:  Positive for father that is deceased from bone cancer.  Mother also deceased, unknown health problems. She has three sisters in good  health, one brother who has coronary disease, and another brother who is in  good health. Her children are in good health except for some asthma.   REVIEW OF SYSTEMS:  As under HPI. All else negative.   PHYSICAL EXAMINATION:  GENERAL:  She appears ill but not severely acute  distress.  HEART:  Head and neck showed no JVD or adenopathy.  NECK:  Supple.  CHEST:  Clear.  CARDIAC:  Regular rhythm. No murmurs, gallops, or rubs.  ABDOMEN:  Soft with minimal epigastric abdominal tenderness. No organomegaly  or masses.  EXTREMITIES:  No  clubbing, cyanosis, or edema.  NEUROLOGICAL:  Nonfocal.   IMPRESSION:  1.  Syncope in a known diabetic with coronary disease and hypertension. We      are going to admit her for observation, serial EKGs and enzymes.      Cardiology consultation is anticipated. We will also rule out pulmonary      embolism. Overwhelming likelihood is it was vasovagal; however, the      persistence of symptoms is of major concern.  2.  Hypertension. Intervene expectantly.  3.  Asthma. Bronchodilators as needed. Check a chest x-ray. Empiric      antibiotic therapy at present for the cough.  4.  Diverticulosis. Acute intervention as indicated.  5.  Diabetes mellitus type 2. Continue ADA diet. Sliding scale as needed.      Madelin Rear. Sherwood Gambler, MD  Electronically Signed     LJF/MEDQ  D:  06/15/2005  T:  06/15/2005  Job:  [id number]

## 2011-03-26 NOTE — Consult Note (Signed)
[name] [name], [name] NO.:  [id number]   MEDICAL RECORD NO.:  [id number]          PATIENT TYPE:  INP   LOCATION:  A202                          FACILITY:  [facility]   PHYSICIAN:  Jerolyn Shin C. Katrinka Blazing, M.D.   DATE OF BIRTH:  June 25, 1941   DATE OF CONSULTATION:  08/15/2005  DATE OF DISCHARGE:                                   CONSULTATION   This is a 70 year old female admitted by Dr. Phillips Odor for evaluation of  suspected diverticulitis. The patient gives a history of acute onset of  severe abdominal pain on Friday evening. The pain started in the left lower  quadrant and settled in her lower pelvis. She had nausea but no vomiting.  She did not have diarrhea. She denies fever and chills. She has had similar  episodes in the past. She states that she has had at least five episodes,  three of which were treated with oral medication as an outpatient, and  another which required hospitalization.  She does not remember the date of  the hospitalization for diverticulitis. She states the pain that she has now  is just as severe or possibly more severe than she had in the past. The  patient is afebrile and does not have significant leukocytosis. She has  already been started on antibiotic therapy and seems to be making an initial  response.   PAST MEDICAL HISTORY:  1.  Osteoarthritis.  2.  Diabetes mellitus.  3.  History of old CVA.  4.  Gastroesophageal reflux disease.  5.  History of migraine headaches.   PAST SURGICAL HISTORY:  Cholecystectomy, total abdominal hysterectomy,  oophorectomy, and some type of open hiatal hernia repair. She also has  nonobstructive bladder disease.   She has had multiple evaluations for chest pain. She had a cardiac  catheterization in August of 2003. This showed that her left main coronary  artery was normal. Her LAD was normal. There was a 20 to 30% stenosis of the  first diagonal branch. Right coronary normal. There was about a 50% stenosis  of  the circumflex. Ejection fraction by cardiac catheterization was 60%. She  had an echocardiogram that was entirely normal with an ejection fraction of  79%. An abdominal aortogram showed normal iliac, SMA and anterior mesenteric  vessels with normal bilateral renal's. Holter monitor showed sinus rhythm  with multiple PACs and PVCs. Carotid Doppler study in 2004 revealed no  irregularity of her cerebrovascular system though she had a history of CVA.   MEDICATIONS:  Actos, Toprol, Nexium, Ventolin, Xanax and hydrocodone. She is  not quite sure of the dosages.   ALLERGIES:  PENICILLIN (causes swelling).       ASPIRIN, SULFA, DEMEROL,  DARVOCET, VICODIN AND CIPRO (some of these medications only caused GI  upset). She states that she cannot take Vicodin but can take hydrocodone  which are the same medication.   FAMILY HISTORY:  Mother died of an MI at age 48, father died of  complications of congestive heart failure at age 54. She has five siblings  with diabetes mellitus.   SOCIAL HISTORY:  She is  medically disabled. She does not drink, smoke or use  drugs. She has five children.  She lives with her husband.   PHYSICAL EXAMINATION:  GENERAL:  She does not appear to be in any acute  distress. She is a pleasant, obese female who is alert and cooperative,  fully oriented.  VITAL SIGNS:  Blood pressure 130/60, pulse is 82, respiration 16,  temperature is 98 degrees.  HEENT:  Unremarkable.  NECK:  Supple, no JVD, bruit, adenopathy or thyromegaly.  CHEST:  Clear to auscultation.  HEART:  Irregular rhythm, no murmur, no gallop.  ABDOMEN:  Obese with moderately severe tenderness in the left lower quadrant  with guarding and rebound, also with guarding and rebound in the suprapubic  midline. She has good active bowel sounds.  EXTREMITIES:  No clubbing, cyanosis or edema. No joint deformities.   REVIEW OF INITIAL LABS:  Reviewed them to be totally normal. She does not  have leukocytosis. Her  potassium however will need to be improved.  Urinalysis reveals moderate blood but no nitrites or leukocyte esterase.   CT scan reveals a significant left upper sigmoid colon with thickening and  diverticulitis with questionable adjacent cystic mass that may or may not  have a connection with the colon. This does not appear to be in the  mesentery and seems to be lateral to the sigmoid colon. It could possibly be  an abscess but may also represent a cystic mass of the ovary. A pelvic  ultrasound would better define this.   IMPRESSION:  1.  Acute diverticulitis with clinical as well as radiographic evidence of      inflammation of the distal sigmoid colon. There may or may not be a      contained abscess.  2.  Nonobstructive clinically insignificant coronary artery disease.  3.  Osteoarthritis.  4.  Diabetes mellitus, non Insulin dependent.  5.  Gastroesophageal reflux disease.  6.  History of migraine headaches.  7.  History of CVA with normal carotid Doppler studies.   RECOMMENDATIONS:  The best treatment would be broad spectrum antibiotics for  gram negative and anaerobic coverage. With the patient's allergies, it is  difficult to make a suggestion as to what to treat her with. She has already  been started on Flagyl which is a good choice. I am not sure that p.o.  Bactrim is going to offer her high enough serum level in order to be  effective in treating any collections that may be extramural, especially if  the mass in the pelvis is an abscess. Doxycycline at 100 mg twice a day  would be a very good choice to add to the Flagyl. However, Dr. Phillips Odor has  given Tetracycline, though when the patient is questioned she states that  she does not know what Tetracycline is and does not remember ever having a  reaction to it. His other choice is imipenem which is a good choice and if  he decides on imipenem I suggest that she be started on this on his dosing schedule and that gentamycin  be added for the next 3 to 5 days in order to  get a good tissue levels so that the acute inflammatory response can be  avoided. She should have 4 to 6 weeks of oral antibiotics after she is  discharged, if indeed she  responds to this treatment. She should be treated for about 3 to 5 days  based on her clinical response before being discharged home on oral  antibiotics. I will follow along with Dr. Phillips Odor and will be available if  he feels that she is not responding well clinically.      Dirk Dress. Katrinka Blazing, M.D.  Electronically Signed     LCS/MEDQ  D:  08/15/2005  T:  08/15/2005  Job:  [id number]

## 2011-04-29 ENCOUNTER — Ambulatory Visit (INDEPENDENT_AMBULATORY_CARE_PROVIDER_SITE_OTHER): Payer: 59 | Admitting: Cardiology

## 2011-04-29 ENCOUNTER — Encounter: Payer: Self-pay | Admitting: Cardiology

## 2011-04-29 VITALS — BP 141/71 | HR 75 | Ht 62.0 in | Wt 211.0 lb

## 2011-04-29 DIAGNOSIS — I6350 Cerebral infarction due to unspecified occlusion or stenosis of unspecified cerebral artery: Secondary | ICD-10-CM

## 2011-04-29 DIAGNOSIS — I2510 Atherosclerotic heart disease of native coronary artery without angina pectoris: Secondary | ICD-10-CM

## 2011-04-29 DIAGNOSIS — F17200 Nicotine dependence, unspecified, uncomplicated: Secondary | ICD-10-CM

## 2011-04-29 DIAGNOSIS — R011 Cardiac murmur, unspecified: Secondary | ICD-10-CM

## 2011-04-29 DIAGNOSIS — I639 Cerebral infarction, unspecified: Secondary | ICD-10-CM

## 2011-04-29 DIAGNOSIS — I6529 Occlusion and stenosis of unspecified carotid artery: Secondary | ICD-10-CM

## 2011-04-29 NOTE — Progress Notes (Signed)
HPI Mrs. [name] [name] returns today for evaluation and management of her coronary artery disease, history of myocardial infarction, nonobstructive carotid disease, and mild aortic stenosis.  She is having no angina or symptoms of TIAs or mini strokes. She's had several mini strokes in the past. We went over those in detail today.  He is intolerant of aspirin which upsets her stomach. She is on Plavix.  Echocardiogram April 2011 showed mild aortic stenosis with normal left ventricular function. She's had no syncope and no significant dyspnea on exertion.  Carotid Dopplers in April 2011 showed minimal plaque.  Medicines were reconciled and she seems to be compliant. We added aspirin to her allergy list. I note she is not on a statin and I'm not sure why. Also been a major issue. We'll research and see if we can get her on generic. Herl echocardiogram today shows normal sinus rhythm with an occasional PVC otherwise normal.  She still uses smokeless tobacco. Advised to quit.   Past Surgical History  Procedure Date  . Abdominal hysterectomy   . Cholecystectomy   . Oophorectomy   . Colostomy   . Tonsillectomy     No family history on file.  History   Social History  . Marital Status: Married    Spouse Name: N/A    Number of Children: N/A  . Years of Education: N/A   Occupational History  . disabled    Social History Main Topics  . Smoking status: Former Smoker    Types: Cigarettes  . Smokeless tobacco: Current User  . Alcohol Use: No  . Drug Use: No  . Sexually Active: Not on file   Other Topics Concern  . Not on file   Social History Narrative  . No narrative on file    Allergies  Allergen Reactions  . Aspirin   . Penicillins   . Propoxyphene N-Acetaminophen     Current Outpatient Prescriptions  Medication Sig Dispense Refill  . acetaminophen (TYLENOL) 325 MG tablet Take 650 mg by mouth every 6 (six) hours as needed.        . ALPRAZolam (XANAX) 0.5 MG tablet  Take 0.5 mg by mouth at bedtime as needed.        Marland Kitchen amitriptyline (ELAVIL) 25 MG tablet Take 25 mg by mouth at bedtime.        . clopidogrel (PLAVIX) 75 MG tablet Take 75 mg by mouth daily.        Marland Kitchen esomeprazole (NEXIUM) 40 MG capsule Take 40 mg by mouth daily before breakfast.        . HYDROcodone-acetaminophen (NORCO) 7.5-325 MG per tablet Take 1 tablet by mouth every 6 (six) hours as needed.        . metoprolol (TOPROL-XL) 100 MG 24 hr tablet Take 100 mg by mouth daily.        . ondansetron (ZOFRAN) 4 MG tablet Take 4 mg by mouth as needed.        . zolpidem (AMBIEN) 10 MG tablet Take 10 mg by mouth at bedtime as needed.        Marland Kitchen DISCONTD: cloNIDine (CATAPRES) 0.2 MG tablet Take 0.2 mg by mouth 4 (four) times daily.        Marland Kitchen DISCONTD: fexofenadine (ALLEGRA) 180 MG tablet Take 180 mg by mouth daily.          ROS Negative other than HPI.   PE General Appearance: well developed, well nourished in no acute distress, obese HEENT: symmetrical face, PERRLA, Edentulous Neck:  no JVD, thyromegaly, or adenopathy, trachea midline Chest: symmetric without deformity Cardiac: PMI non-displaced, RRR, normal S1, S2, no gallop , 2/6 systolic murmur consistent with aortic stenosis. S2 splits Lung: clear to ausculation and percussion Vascular: all pulses full without bruits, pulse and lower extremity slightly reduced but present  Abdominal: nondistended, nontender, good bowel sounds, no HSM, no bruits Extremities: no cyanosis, clubbing or edema, no sign of DVT, no varicosities  Skin: normal color, no rashes Neuro: alert and oriented x 3, non-focal Pysch: normal affect Filed Vitals:   04/29/11 1108  BP: 141/71  Pulse: 75  Height: 5\' 2"  (1.575 m)  Weight: 211 lb (95.709 kg)  SpO2: 92%    EKG  Labs and Studies Reviewed.   Lab Results  Component Value Date   WBC 10.6* 04/04/2010   HGB 12.1 04/04/2010   HCT 35.6* 04/04/2010   MCV 87.7 04/04/2010   PLT 137* 04/04/2010      Chemistry        Component Value Date/Time   NA 136 04/04/2010 0501   K 4.9 04/04/2010 0501   CL 102 04/04/2010 0501   CO2 29 04/04/2010 0501   BUN 40* 04/04/2010 0501   CREATININE 1.51* 04/04/2010 0501      Component Value Date/Time   CALCIUM 9.6 04/04/2010 0501       Lab Results  Component Value Date   CHOL  Value: 193        ATP III CLASSIFICATION:  <200     mg/dL   Desirable  161-096  mg/dL   Borderline High  >=045    mg/dL   High        02/14/8118   Lab Results  Component Value Date   HDL 56 03/28/2010   Lab Results  Component Value Date   LDLCALC  Value: 120        Total Cholesterol/HDL:CHD Risk Coronary Heart Disease Risk Table                     Men   Women  1/2 Average Risk   3.4   3.3  Average Risk       5.0   4.4  2 X Average Risk   9.6   7.1  3 X Average Risk  23.4   11.0        Use the calculated Patient Ratio above and the CHD Risk Table to determine the patient's CHD Risk.        ATP III CLASSIFICATION (LDL):  <100     mg/dL   Optimal  147-829  mg/dL   Near or Above                    Optimal  130-159  mg/dL   Borderline  562-130  mg/dL   High  >865     mg/dL   Very High* 7/84/6962   Lab Results  Component Value Date   TRIG 85 03/28/2010   Lab Results  Component Value Date   CHOLHDL 3.4 03/28/2010   Lab Results  Component Value Date   HGBA1C  Value: 6.0 (NOTE)  According to the ADA Clinical Practice Recommendations for 2011, when HbA1c is used as a screening test:   >=6.5%   Diagnostic of Diabetes Mellitus           (if abnormal result  is confirmed)  5.7-6.4%   Increased risk of developing Diabetes Mellitus  References:Diagnosis and Classification of Diabetes Mellitus,Diabetes Care,2011,34(Suppl 1):S62-S69 and Standards of Medical Care in         Diabetes - 2011,Diabetes Care,2011,34  (Suppl 1):S11-S61.* 03/28/2010   No results found for this basename: ALT, AST, GGT, ALKPHOS, BILITOT

## 2011-04-29 NOTE — Assessment & Plan Note (Signed)
Mild aortic stenosis by echocardiogram April 2011. Follow clinically.

## 2011-04-29 NOTE — Patient Instructions (Signed)
Your physician recommends that you continue on your current medications as directed. Please refer to the Current Medication list given to you today.  Your physician recommends that you quit snuff  Your physician recommends that you schedule a follow-up appointment in: 1 year

## 2011-04-29 NOTE — Assessment & Plan Note (Signed)
Stable. Continue medical therapy 

## 2011-05-05 ENCOUNTER — Other Ambulatory Visit: Payer: Self-pay

## 2011-05-05 MED ORDER — CLOPIDOGREL BISULFATE 75 MG PO TABS: 75.0000 mg | ORAL_TABLET | Freq: Every day | ORAL | Status: DC

## 2011-05-07 ENCOUNTER — Encounter: Payer: Self-pay | Admitting: Cardiology

## 2011-08-04 ENCOUNTER — Ambulatory Visit: Payer: 59 | Admitting: Adult Health

## 2011-08-04 ENCOUNTER — Ambulatory Visit (INDEPENDENT_AMBULATORY_CARE_PROVIDER_SITE_OTHER): Payer: Medicare Other | Admitting: Physician Assistant

## 2011-08-04 ENCOUNTER — Encounter: Payer: Self-pay | Admitting: Physician Assistant

## 2011-08-04 DIAGNOSIS — R079 Chest pain, unspecified: Secondary | ICD-10-CM | POA: Insufficient documentation

## 2011-08-04 DIAGNOSIS — E785 Hyperlipidemia, unspecified: Secondary | ICD-10-CM

## 2011-08-04 DIAGNOSIS — J449 Chronic obstructive pulmonary disease, unspecified: Secondary | ICD-10-CM

## 2011-08-04 DIAGNOSIS — I060 Rheumatic aortic stenosis: Secondary | ICD-10-CM

## 2011-08-04 DIAGNOSIS — R011 Cardiac murmur, unspecified: Secondary | ICD-10-CM

## 2011-08-04 DIAGNOSIS — I2510 Atherosclerotic heart disease of native coronary artery without angina pectoris: Secondary | ICD-10-CM

## 2011-08-04 DIAGNOSIS — I10 Essential (primary) hypertension: Secondary | ICD-10-CM

## 2011-08-04 NOTE — Assessment & Plan Note (Signed)
Blood pressure controlled. 

## 2011-08-04 NOTE — Assessment & Plan Note (Signed)
Patient is due for her fasting lipid panel

## 2011-08-04 NOTE — Assessment & Plan Note (Signed)
Patient had mild AS on echo in April 2011. We will recheck echo

## 2011-08-04 NOTE — Assessment & Plan Note (Addendum)
The patient had several types of chest pain mostly at rest. Some of it is worrisome for ischemia. She describes it as a dull ache in her chest radiating to her left arm or a heavy feeling in her chest associated with diaphoresis. Her last cardiac catheterization was in 2003 at which time she had a 50% distal circumflex and mild dilatation of the proximal one third of the RCA but no aneurysm. She had normal LV function. Last stress test was in 2006 that was normal.  We will order a lexiscan to evaluate her for ischemia.I have also added Imdur to her medications and instructed her on how to properly take nitroglycerin sublingual.

## 2011-08-04 NOTE — Progress Notes (Signed)
HPI:  This is a 70 year old female patient with history of coronary artery disease with catheter in 2003 revealing 50% distal circumflex below we function, mild dilatation in the proximal one third of the RCA but no aneurysm. She also has atrial fibrillation, hypertension, bipolar disease, history of CVAs, COPD and diabetes mellitus.  Recently the patient was seen by primary care for cough and aching in the back of her shoulders. He suggested that this may be an anginal equivalent and that she see [place]. Since she was treated with prednisone and antibiotics for bronchitis her shoulder pain has resolved.  The patient does admit to a dull ache in her chest that radiates down her left arm. She also has a pressure and heavy feeling in her chest at times associated with diaphoresis. Both these episodes occur at rest and may last 15-20 minutes. She has not used nitroglycerin. She is very inactive because of her COPD and chronic dyspnea on exertion as well as arthritis.    Allergies  Allergen Reactions  . Aspirin   . Penicillins   . Propoxyphene N-Acetaminophen     Current Outpatient Prescriptions on File Prior to Visit  Medication Sig Dispense Refill  . acetaminophen (TYLENOL) 325 MG tablet Take 650 mg by mouth every 6 (six) hours as needed.        . ALPRAZolam (XANAX) 0.5 MG tablet Take 0.5 mg by mouth at bedtime as needed.        Marland Kitchen amitriptyline (ELAVIL) 25 MG tablet Take 25 mg by mouth at bedtime.        . clopidogrel (PLAVIX) 75 MG tablet Take 1 tablet (75 mg total) by mouth daily.  30 tablet  11  . esomeprazole (NEXIUM) 40 MG capsule Take 40 mg by mouth daily before breakfast.        . HYDROcodone-acetaminophen (NORCO) 7.5-325 MG per tablet Take 1 tablet by mouth every 6 (six) hours as needed.        . metoprolol (TOPROL-XL) 100 MG 24 hr tablet Take 100 mg by mouth daily.        . ondansetron (ZOFRAN) 4 MG tablet Take 4 mg by mouth as needed.        . zolpidem (AMBIEN) 10 MG tablet Take 10 mg  by mouth at bedtime as needed.          Past Medical History  Diagnosis Date  . Anxiety   . Depression   . Hypertension   . GERD (gastroesophageal reflux disease)   . Hyperlipidemia   . Myocardial infarction   . Stroke     times 2    Past Surgical History  Procedure Date  . Abdominal hysterectomy   . Cholecystectomy   . Oophorectomy   . Colostomy   . Tonsillectomy     No family history on file.  History   Social History  . Marital Status: Married    Spouse Name: N/A    Number of Children: N/A  . Years of Education: N/A   Occupational History  . disabled    Social History Main Topics  . Smoking status: Former Smoker    Types: Cigarettes  . Smokeless tobacco: Current User  . Alcohol Use: No  . Drug Use: No  . Sexually Active: Not on file   Other Topics Concern  . Not on file   Social History Narrative  . No narrative on file    ROS: See HPI Eyes: Negative Ears:Negative for hearing loss, tinnitus Cardiovascular: Negative for  palpitations,irregular heartbeat, near-syncope, orthopnea, paroxysmal nocturnal dyspnia and syncope,edema, claudication, cyanosis,.  Respiratory:   Negative for hemoptysis, sleep disturbances due to breathing, sputum production and wheezing.   Endocrine: Negative for cold intolerance and heat intolerance.  Hematologic/Lymphatic: Negative for adenopathy and bleeding problem. Does not bruise/bleed easily.  Musculoskeletal:arthritis   Gastrointestinal: Negative for nausea, vomiting, reflux, abdominal pain, diarrhea, constipation.   Neurological: Negative.  Allergic/Immunologic: Negative for environmental allergies.   PHYSICAL EXAM: Obese, in no acute distress. Neck: No JVD, HJR, Bruit, or thyroid enlargement Lungs: Decreased breath sounds,No tachypnea, clear without wheezing, rales, or rhonchi Cardiovascular: RRR, PMI not displaced,2/6 systolic murmur at the left sternal border, no bruit, thrill, or heave. Abdomen: BS normal. Soft  without organomegaly, masses, lesions or tenderness. Extremities: without cyanosis, clubbing or edema. Good distal pulses bilateral SKin: Warm, no lesions or rashes  Musculoskeletal: No deformities Neuro: no focal signs  BP 128/66  Pulse 64  Resp 18  Ht 5\' 3"  (1.6 m)  Wt 213 lb (96.616 kg)  BMI 37.73 kg/m2  SpO2 95%  HQI:ONGEXB sinus rhythm inferior Q waves poor tracing

## 2011-08-04 NOTE — Patient Instructions (Signed)
Your physician recommends that you schedule a follow-up appointment in: 3-4 weeks with Dr Daleen Squibb Your physician recommends that you return for lab work in: this week Your physician has requested that you have an echocardiogram. Echocardiography is a painless test that uses sound waves to create images of your heart. It provides your doctor with information about the size and shape of your heart and how well your heart's chambers and valves are working. This procedure takes approximately one hour. There are no restrictions for this procedure. Your physician has requested that you have a lexiscan myoview. For further information please visit [email or web address] Please follow instruction sheet, as given.

## 2011-08-05 ENCOUNTER — Other Ambulatory Visit: Payer: Self-pay | Admitting: Physician Assistant

## 2011-08-11 ENCOUNTER — Encounter (HOSPITAL_COMMUNITY)
Admission: RE | Admit: 2011-08-11 | Discharge: 2011-08-11 | Disposition: A | Discharge status: Home / Self Care | Payer: Medicare Other | Source: Ambulatory Visit | Attending: Physician Assistant | Admitting: Physician Assistant

## 2011-08-11 ENCOUNTER — Encounter (HOSPITAL_COMMUNITY): Payer: Self-pay

## 2011-08-11 ENCOUNTER — Ambulatory Visit (HOSPITAL_COMMUNITY)
Admission: RE | Admit: 2011-08-11 | Discharge: 2011-08-11 | Disposition: A | Discharge status: Home / Self Care | Payer: Medicare Other | Source: Ambulatory Visit | Attending: Physician Assistant | Admitting: Physician Assistant

## 2011-08-11 ENCOUNTER — Ambulatory Visit (INDEPENDENT_AMBULATORY_CARE_PROVIDER_SITE_OTHER): Payer: Medicare Other | Admitting: *Deleted

## 2011-08-11 DIAGNOSIS — I060 Rheumatic aortic stenosis: Secondary | ICD-10-CM

## 2011-08-11 DIAGNOSIS — J449 Chronic obstructive pulmonary disease, unspecified: Secondary | ICD-10-CM | POA: Insufficient documentation

## 2011-08-11 DIAGNOSIS — E785 Hyperlipidemia, unspecified: Secondary | ICD-10-CM | POA: Insufficient documentation

## 2011-08-11 DIAGNOSIS — I4891 Unspecified atrial fibrillation: Secondary | ICD-10-CM | POA: Insufficient documentation

## 2011-08-11 DIAGNOSIS — I2510 Atherosclerotic heart disease of native coronary artery without angina pectoris: Secondary | ICD-10-CM | POA: Insufficient documentation

## 2011-08-11 DIAGNOSIS — R079 Chest pain, unspecified: Secondary | ICD-10-CM

## 2011-08-11 DIAGNOSIS — J4489 Other specified chronic obstructive pulmonary disease: Secondary | ICD-10-CM | POA: Insufficient documentation

## 2011-08-11 DIAGNOSIS — I359 Nonrheumatic aortic valve disorder, unspecified: Secondary | ICD-10-CM | POA: Insufficient documentation

## 2011-08-11 MED ORDER — TECHNETIUM TC 99M TETROFOSMIN IV KIT
10.0000 | PACK | Freq: Once | INTRAVENOUS | Status: AC | PRN
  Administered 2011-08-11: 9.9 via INTRAVENOUS

## 2011-08-11 MED ORDER — TECHNETIUM TC 99M TETROFOSMIN IV KIT
30.0000 | PACK | Freq: Once | INTRAVENOUS | Status: AC | PRN
  Administered 2011-08-11: 29.2 via INTRAVENOUS

## 2011-08-11 NOTE — Progress Notes (Addendum)
Stress Lab Nurses Notes - [name] [name]  [name] [name] 08/11/2011  Reason for doing test: CAD and Chest Pain  Type of test: Steffanie Dunn  Nurse performing test: Parke Poisson, RN  Nuclear Medicine Tech: Lyndel Pleasure  Echo Tech: Not Applicable  MD performing test: R. Rothbart  Family MD: Fusco  Test explained and consent signed: yes  IV started: 22g jelco, Saline lock flushed, No redness or edema and Saline lock started in radiology  Symptoms: Abdominal pressure  Treatment/Intervention: None  Reason test stopped: protocol completed  After recovery IV was: Discontinued via X-ray tech and No redness or edema  Patient to return to Nuc. Med at :12:30  Patient discharged: Home  Patient's Condition upon discharge was: stable  Comments: During  Test BP 138/72 & HR 76.  Recovery BP  128/68 & HR 68.  Symptoms resolved in recovery.  Erskine Speed T  Please see dictated report for Stress Nuclear Study.

## 2011-08-11 NOTE — Progress Notes (Signed)
*  PRELIMINARY RESULTS* Echocardiogram 2D Echocardiogram has been performed.  [name] [name] 08/11/2011, 1:10 PM

## 2011-08-12 LAB — LIPID PANEL
Cholesterol: 241 mg/dL — ABNORMAL HIGH (ref 0–200)
HDL: 80 mg/dL (ref >39)
Total CHOL/HDL Ratio: 3 Ratio
Triglycerides: 99 mg/dL (ref <150)

## 2011-08-17 ENCOUNTER — Other Ambulatory Visit: Payer: Self-pay

## 2011-08-17 DIAGNOSIS — E785 Hyperlipidemia, unspecified: Secondary | ICD-10-CM

## 2011-08-17 MED ORDER — ATORVASTATIN CALCIUM 20 MG PO TABS: 20.0000 mg | ORAL_TABLET | Freq: Every day | ORAL | Status: DC

## 2011-08-25 ENCOUNTER — Encounter: Payer: Self-pay | Admitting: Adult Health

## 2011-08-31 ENCOUNTER — Ambulatory Visit: Payer: 59 | Admitting: Adult Health

## 2011-09-07 ENCOUNTER — Encounter: Payer: Self-pay | Admitting: Adult Health

## 2011-09-09 ENCOUNTER — Ambulatory Visit: Payer: Self-pay | Admitting: Adult Health

## 2011-10-18 ENCOUNTER — Other Ambulatory Visit (HOSPITAL_COMMUNITY): Payer: Self-pay | Admitting: Physician Assistant

## 2011-10-18 DIAGNOSIS — M7989 Other specified soft tissue disorders: Secondary | ICD-10-CM

## 2011-10-18 DIAGNOSIS — M79609 Pain in unspecified limb: Secondary | ICD-10-CM

## 2011-10-19 ENCOUNTER — Ambulatory Visit (HOSPITAL_COMMUNITY)
Admission: RE | Admit: 2011-10-19 | Discharge: 2011-10-19 | Disposition: A | Discharge status: Home / Self Care | Payer: Medicare Other | Source: Ambulatory Visit | Attending: Physician Assistant | Admitting: Physician Assistant

## 2011-10-19 DIAGNOSIS — M7989 Other specified soft tissue disorders: Secondary | ICD-10-CM

## 2011-10-19 DIAGNOSIS — M79609 Pain in unspecified limb: Secondary | ICD-10-CM | POA: Insufficient documentation

## 2011-11-24 ENCOUNTER — Encounter (HOSPITAL_COMMUNITY): Payer: Self-pay | Admitting: Cardiology

## 2011-12-16 ENCOUNTER — Other Ambulatory Visit: Payer: Self-pay | Admitting: Physician Assistant

## 2012-03-30 ENCOUNTER — Other Ambulatory Visit: Payer: Self-pay | Admitting: Cardiology

## 2012-04-04 ENCOUNTER — Other Ambulatory Visit (HOSPITAL_COMMUNITY): Payer: Self-pay | Admitting: Physician Assistant

## 2012-04-04 ENCOUNTER — Ambulatory Visit (HOSPITAL_COMMUNITY)
Admission: RE | Admit: 2012-04-04 | Discharge: 2012-04-04 | Disposition: A | Discharge status: Home / Self Care | Payer: PRIVATE HEALTH INSURANCE | Source: Ambulatory Visit | Attending: Physician Assistant | Admitting: Physician Assistant

## 2012-04-04 DIAGNOSIS — J42 Unspecified chronic bronchitis: Secondary | ICD-10-CM | POA: Insufficient documentation

## 2012-04-04 DIAGNOSIS — R05 Cough: Secondary | ICD-10-CM

## 2012-04-04 DIAGNOSIS — R059 Cough, unspecified: Secondary | ICD-10-CM | POA: Insufficient documentation

## 2012-05-01 ENCOUNTER — Encounter: Payer: Self-pay | Admitting: Cardiology

## 2012-05-01 ENCOUNTER — Ambulatory Visit (INDEPENDENT_AMBULATORY_CARE_PROVIDER_SITE_OTHER): Payer: Medicaid Other | Admitting: Cardiology

## 2012-05-01 VITALS — BP 131/76 | HR 73 | Resp 16 | Ht 62.0 in | Wt 207.0 lb

## 2012-05-01 DIAGNOSIS — F17200 Nicotine dependence, unspecified, uncomplicated: Secondary | ICD-10-CM

## 2012-05-01 DIAGNOSIS — I10 Essential (primary) hypertension: Secondary | ICD-10-CM

## 2012-05-01 DIAGNOSIS — I6529 Occlusion and stenosis of unspecified carotid artery: Secondary | ICD-10-CM

## 2012-05-01 DIAGNOSIS — I2510 Atherosclerotic heart disease of native coronary artery without angina pectoris: Secondary | ICD-10-CM

## 2012-05-01 DIAGNOSIS — I639 Cerebral infarction, unspecified: Secondary | ICD-10-CM

## 2012-05-01 DIAGNOSIS — I219 Acute myocardial infarction, unspecified: Secondary | ICD-10-CM

## 2012-05-01 DIAGNOSIS — J449 Chronic obstructive pulmonary disease, unspecified: Secondary | ICD-10-CM

## 2012-05-01 DIAGNOSIS — I350 Nonrheumatic aortic (valve) stenosis: Secondary | ICD-10-CM

## 2012-05-01 DIAGNOSIS — I6350 Cerebral infarction due to unspecified occlusion or stenosis of unspecified cerebral artery: Secondary | ICD-10-CM

## 2012-05-01 DIAGNOSIS — I359 Nonrheumatic aortic valve disorder, unspecified: Secondary | ICD-10-CM

## 2012-05-01 DIAGNOSIS — E785 Hyperlipidemia, unspecified: Secondary | ICD-10-CM

## 2012-05-01 NOTE — Progress Notes (Signed)
HPI [name] comes in today for evaluation and management of her coronary artery disease. She is having no angina or ischemic chest discomfort. She had about 2 months of severe bronchitis and pneumonia which she is slowly getting over. She has severe coughing with the sharp chest pains during that time which improved. She is on low-dose ACE inhibitor and that as well.  She cannot take aspirin. She remains on Plavix.  She also has mild aortic stenosis by echocardiogram last year.  Primary care checks her blood work and manages her risk factors.  Past Medical History  Diagnosis Date  . Anxiety   . Depression   . Hypertension   . GERD (gastroesophageal reflux disease)   . Hyperlipidemia   . Myocardial infarction   . Stroke     times 2  . Asthma     Current Outpatient Prescriptions  Medication Sig Dispense Refill  . acetaminophen (TYLENOL) 325 MG tablet Take 650 mg by mouth every 6 (six) hours as needed.        Marland Kitchen albuterol (PROVENTIL HFA;VENTOLIN HFA) 108 (90 BASE) MCG/ACT inhaler Inhale 2 puffs into the lungs every 6 (six) hours as needed.        Marland Kitchen albuterol (PROVENTIL) 4 MG tablet Take 4 mg by mouth 2 (two) times daily.      Marland Kitchen ALPRAZolam (XANAX) 0.5 MG tablet Take 0.5 mg by mouth at bedtime as needed.        Marland Kitchen amitriptyline (ELAVIL) 25 MG tablet Take 25 mg by mouth at bedtime.        Marland Kitchen esomeprazole (NEXIUM) 40 MG capsule Take 40 mg by mouth daily before breakfast.        . HYDROcodone-acetaminophen (NORCO) 7.5-325 MG per tablet Take 1 tablet by mouth every 6 (six) hours as needed.        Marland Kitchen LIPITOR 20 MG tablet TAKE (1) TABLET BY MOUTH AT BEDTIME.  30 each  6  . lisinopril (PRINIVIL,ZESTRIL) 2.5 MG tablet Take 2.5 mg by mouth daily.      . metoprolol (TOPROL-XL) 100 MG 24 hr tablet Take 100 mg by mouth daily.        Marland Kitchen PLAVIX 75 MG tablet TAKE (1) TABLET BY MOUTH ONCE DAILY.  30 each  3  . zolpidem (AMBIEN) 10 MG tablet Take 10 mg by mouth at bedtime as needed.          Allergies   Allergen Reactions  . Aspirin   . Penicillins   . Propoxyphene-Acetaminophen     Family History  Problem Relation Age of Onset  . Cancer Other   . Diabetes Other   . Abnormal EKG Other   . Arthritis Other   . Asthma Other   . Kidney disease Other     History   Social History  . Marital Status: Widowed    Spouse Name: N/A    Number of Children: N/A  . Years of Education: N/A   Occupational History  . disabled    Social History Main Topics  . Smoking status: Former Smoker    Types: Cigarettes  . Smokeless tobacco: Current User  . Alcohol Use: No  . Drug Use: No  . Sexually Active: Not on file   Other Topics Concern  . Not on file   Social History Narrative  . No narrative on file    ROS ALL NEGATIVE EXCEPT THOSE NOTED IN HPI  PE  General Appearance: well developed, well nourished in no acute distress,  obese HEENT: symmetrical face, PERRLA,  Neck: no JVD, thyromegaly, or adenopathy, trachea midline Chest: symmetric without deformity Cardiac: PMI non-displaced, RRR, all S1, S2, no gallop, 2/6 systolic murmur along the sternal border  Lung: clear to ausculation and percussion Vascular: Diminished in the lower extremities but feet are warm Abdominal: nondistended, nontender, good bowel sounds, no HSM, no bruits Extremities: no cyanosis, clubbing or edema, no sign of DVT, no varicosities  Skin: normal color, no rashes Neuro: alert and oriented x 3, non-focal Pysch: normal affect  EKG  BMET    Component Value Date/Time   NA 136 04/04/2010 0501   K 4.9 04/04/2010 0501   CL 102 04/04/2010 0501   CO2 29 04/04/2010 0501   GLUCOSE 111* 04/04/2010 0501   BUN 40* 04/04/2010 0501   CREATININE 1.51* 04/04/2010 0501   CALCIUM 9.6 04/04/2010 0501   GFRNONAA 34* 04/04/2010 0501   GFRAA  Value: 41        The eGFR has been calculated using the MDRD equation. This calculation has not been validated in all clinical situations. eGFR's persistently <60 mL/min signify possible  Chronic Kidney Disease.* 04/04/2010 0501    Lipid Panel     Component Value Date/Time   CHOL 241* 08/11/2011 0955   TRIG 99 08/11/2011 0955   HDL 80 08/11/2011 0955   CHOLHDL 3.0 08/11/2011 0955   VLDL 20 08/11/2011 0955   LDLCALC 141* 08/11/2011 0955    CBC    Component Value Date/Time   WBC 10.6* 04/04/2010 0501   RBC 4.06 04/04/2010 0501   HGB 12.1 04/04/2010 0501   HCT 35.6* 04/04/2010 0501   PLT 137* 04/04/2010 0501   MCV 87.7 04/04/2010 0501   MCHC 34.0 04/04/2010 0501   RDW 13.6 04/04/2010 0501   LYMPHSABS 1.4 04/04/2010 0501   MONOABS 0.8 04/04/2010 0501   EOSABS 0.0 04/04/2010 0501   BASOSABS 0.0 04/04/2010 0501

## 2012-05-01 NOTE — Patient Instructions (Addendum)
**Note De-identified Montez Cuda Obfuscation** Your physician recommends that you continue on your current medications as directed. Please refer to the Current Medication list given to you today.  Your physician recommends that you schedule a follow-up appointment in: 1 year  

## 2012-05-01 NOTE — Assessment & Plan Note (Signed)
Stable. Continue secondary preventative therapy. 

## 2012-05-01 NOTE — Assessment & Plan Note (Signed)
Stable by history and exam. Repeat echocardiogram in a year.

## 2012-06-20 ENCOUNTER — Other Ambulatory Visit: Payer: Self-pay | Admitting: *Deleted

## 2012-06-20 MED ORDER — ATORVASTATIN CALCIUM 20 MG PO TABS: 20.0000 mg | ORAL_TABLET | Freq: Every day | ORAL | Status: DC

## 2012-07-18 ENCOUNTER — Other Ambulatory Visit: Payer: Self-pay | Admitting: Physician Assistant

## 2012-07-18 ENCOUNTER — Other Ambulatory Visit: Payer: Self-pay | Admitting: *Deleted

## 2012-07-18 MED ORDER — ATORVASTATIN CALCIUM 20 MG PO TABS: 20.0000 mg | ORAL_TABLET | Freq: Every day | ORAL | Status: DC

## 2012-07-25 ENCOUNTER — Encounter: Payer: Self-pay | Admitting: Pulmonary Disease

## 2012-07-26 ENCOUNTER — Institutional Professional Consult (permissible substitution): Payer: PRIVATE HEALTH INSURANCE | Admitting: Pulmonary Disease

## 2012-08-18 ENCOUNTER — Ambulatory Visit (INDEPENDENT_AMBULATORY_CARE_PROVIDER_SITE_OTHER): Payer: Medicare Other | Admitting: Pulmonary Disease

## 2012-08-18 ENCOUNTER — Encounter: Payer: Self-pay | Admitting: Pulmonary Disease

## 2012-08-18 VITALS — BP 118/78 | HR 81 | Temp 98.0°F | Ht 62.0 in | Wt 217.2 lb

## 2012-08-18 DIAGNOSIS — R0989 Other specified symptoms and signs involving the circulatory and respiratory systems: Secondary | ICD-10-CM

## 2012-08-18 NOTE — Patient Instructions (Addendum)
Will schedule for breathing studies, and see you back same day for review. Work on weight loss and some type of exercise program.

## 2012-08-18 NOTE — Assessment & Plan Note (Signed)
The patient complains of significant dyspnea on exertion, and more than likely this is multifactorial.  She is morbidly obese, and so deconditioned she can barely stand out of a chair and cannot raise her leg to step up onto the exam table.  She has a history of underlying coronary disease, and has not had a recent catheterization.  She has been told that she had COPD, but only smoked for 6 years, and did not have childhood asthma.  At this point, I would like to schedule her for full PFTs to evaluate her flows and lung function.  I will see her back on the same day to review with her.  If these are unremarkable, I would recommend a followup cardiac evaluation.

## 2012-08-18 NOTE — Progress Notes (Signed)
  Subjective:    Patient ID: [name] [name], female    DOB: [birth year], 71 y.o.   MRN: [id number]  HPI The patient is a 71 year old female who I've been asked to see for dyspnea.  The patient states she has had breathing issues for at least 10 years, and feels they have been getting worse most recently.  She describes a less than one block dyspnea on exertion at a moderate pace, and will get winded bringing groceries in from the car or walking up one flight of stairs.  She has a mild cough at times, and will occasionally produces white mucus.  She admits to having chronic lower extremity edema.  The patient has a history of only smoking for a very short period of time, and denies a history of childhood asthma.  To her knowledge, she has not had full pulmonary function studies.  She did have a chest x-ray earlier this year with no acute process.  She tells me that she has had a heart attack before, and has had a recent echo but not catheterization.  The patient states that her weight is neutral to maybe even lighter over the last one year.   Review of Systems  Constitutional: Negative for fever and unexpected weight change.  HENT: Positive for congestion, rhinorrhea, sneezing, postnasal drip and sinus pressure. Negative for ear pain, nosebleeds, sore throat, trouble swallowing and dental problem.   Eyes: Negative for redness and itching.  Respiratory: Positive for cough, chest tightness, shortness of breath and wheezing.   Cardiovascular: Positive for chest pain. Negative for palpitations and leg swelling.  Gastrointestinal: Negative for nausea and vomiting.  Genitourinary: Negative for dysuria.  Musculoskeletal: Negative for joint swelling.  Skin: Negative for rash.  Neurological: Positive for headaches.  Hematological: Does not bruise/bleed easily.  Psychiatric/Behavioral: Negative for dysphoric mood. The patient is not nervous/anxious.        Objective:   Physical Exam Constitutional:   Obese female, no acute distress  HENT:  Nares patent without discharge  Oropharynx without exudate, palate and uvula are elongated   Eyes:  Perrla, eomi, no scleral icterus  Neck:  No JVD, no TMG  Cardiovascular:  Normal rate, regular rhythm, no rubs or gallops.  No murmurs        Intact distal pulses  Pulmonary :  Normal breath sounds, no stridor or respiratory distress   No rhonchi, or wheezing.  Mild crackles both bases.   Abdominal:  Soft, nondistended, bowel sounds present.  No tenderness noted.   Musculoskeletal:  2+ lower extremity edema noted.  Lymph Nodes:  No cervical lymphadenopathy noted  Skin:  No cyanosis noted  Neurologic:  Alert, appropriate, moves all 4 extremities without obvious deficit.         Assessment & Plan:

## 2012-08-25 ENCOUNTER — Telehealth: Payer: Self-pay | Admitting: Cardiology

## 2012-08-25 NOTE — Telephone Encounter (Signed)
Patient states that her PCP diagnosed her with "Heart Failure" yesterday. States that he gave her some meds but she is nervous and wanted to know what to do. / tg

## 2012-08-25 NOTE — Telephone Encounter (Signed)
States that she was given lasix 20 mg daily by her PCP and has a PFT scheduled.  Advised her that her primary care physician would notify [place] if she needed to be seen by cardiology prior to her follow up appointment in June.  Verbalized understanding.

## 2012-09-14 ENCOUNTER — Ambulatory Visit (INDEPENDENT_AMBULATORY_CARE_PROVIDER_SITE_OTHER): Payer: Medicare Other | Admitting: Otolaryngology

## 2012-09-14 ENCOUNTER — Other Ambulatory Visit (HOSPITAL_COMMUNITY)
Admission: RE | Admit: 2012-09-14 | Discharge: 2012-09-14 | Disposition: A | Discharge status: Home / Self Care | Payer: Medicare Other | Source: Ambulatory Visit | Attending: Otolaryngology | Admitting: Otolaryngology

## 2012-09-14 DIAGNOSIS — D3701 Neoplasm of uncertain behavior of lip: Secondary | ICD-10-CM

## 2012-09-14 DIAGNOSIS — L988 Other specified disorders of the skin and subcutaneous tissue: Secondary | ICD-10-CM | POA: Insufficient documentation

## 2012-09-22 ENCOUNTER — Ambulatory Visit: Payer: Medicare Other | Admitting: Pulmonary Disease

## 2012-09-26 ENCOUNTER — Other Ambulatory Visit: Payer: Self-pay | Admitting: Cardiology

## 2012-09-26 MED ORDER — CLOPIDOGREL BISULFATE 75 MG PO TABS: 75.0000 mg | ORAL_TABLET | Freq: Every day | ORAL | Status: AC

## 2012-10-09 ENCOUNTER — Ambulatory Visit (INDEPENDENT_AMBULATORY_CARE_PROVIDER_SITE_OTHER): Payer: Medicare Other | Admitting: Pulmonary Disease

## 2012-10-09 ENCOUNTER — Encounter: Payer: Self-pay | Admitting: Pulmonary Disease

## 2012-10-09 VITALS — BP 102/58 | HR 103 | Temp 97.4°F | Ht 62.0 in | Wt 211.0 lb

## 2012-10-09 DIAGNOSIS — J4489 Other specified chronic obstructive pulmonary disease: Secondary | ICD-10-CM

## 2012-10-09 DIAGNOSIS — R0989 Other specified symptoms and signs involving the circulatory and respiratory systems: Secondary | ICD-10-CM

## 2012-10-09 DIAGNOSIS — R0609 Other forms of dyspnea: Secondary | ICD-10-CM

## 2012-10-09 DIAGNOSIS — J449 Chronic obstructive pulmonary disease, unspecified: Secondary | ICD-10-CM

## 2012-10-09 LAB — PULMONARY FUNCTION TEST

## 2012-10-09 NOTE — Patient Instructions (Addendum)
Stop albuterol inhaler, albuterol tablets, and the nebulizer treatments.  You do not have COPD. Work on weight loss and getting stronger. Would recommend a followup cardiology evaluation.  Will send a note to Dr. Sherwood Gambler to arrange this.

## 2012-10-09 NOTE — Assessment & Plan Note (Signed)
The patient's PFTs today show no airflow obstruction, and surprisingly only mild restriction.  Therefore, she does not have COPD and we should discontinue all of her bronchodilator medications.  I suspect most of her dyspnea on exertion is related to her obesity and deconditioning, but would recommend a cardiac followup to make sure this is not contributing to her symptoms.  I cannot 100% exclude the possibility of chronic thromboembolic disease, and if nothing else is found, she may benefit from a ventilation/perfusion scan for completeness.  I would be happy to see her back on an as needed basis if new issues arise.

## 2012-10-09 NOTE — Progress Notes (Signed)
  Subjective:    Patient ID: [name] [name], female    DOB: 1941-06-02, 71 y.o.   MRN: [id number]  HPI Patient comes in today for followup of her recent PFTs, done as part of workup for dyspnea on exertion.  She was found to have no airflow obstruction, only mild restriction, and a moderate reduction in diffusion capacity that corrected with alveolar volume adjustment.  I have reviewed the study with her in detail, and answered all of her questions.   Review of Systems  Constitutional: Negative for fever and unexpected weight change.  HENT: Positive for congestion and rhinorrhea. Negative for ear pain, nosebleeds, sore throat, sneezing, trouble swallowing, dental problem, postnasal drip and sinus pressure.   Eyes: Positive for redness and itching. Negative for photophobia.  Respiratory: Positive for cough, chest tightness, shortness of breath and wheezing.   Cardiovascular: Positive for leg swelling. Negative for palpitations.  Gastrointestinal: Negative for nausea and vomiting.  Genitourinary: Negative for dysuria.  Musculoskeletal: Positive for joint swelling.  Skin: Negative for rash.  Neurological: Positive for headaches.  Hematological: Bruises/bleeds easily.  Psychiatric/Behavioral: Negative for dysphoric mood. The patient is not nervous/anxious.        Objective:   Physical Exam Morbidly obese female in no acute distress Nose without purulence or discharge noted Neck without lymphadenopathy or thyromegaly Chest with few basilar crackles otherwise clear Lower extremities with mild edema, no cyanosis Alert and oriented, moves all 4 extremities.       Assessment & Plan:

## 2012-10-09 NOTE — Progress Notes (Signed)
PFT done today. 

## 2012-10-18 ENCOUNTER — Encounter: Payer: Self-pay | Admitting: Pulmonary Disease

## 2012-11-27 ENCOUNTER — Ambulatory Visit (INDEPENDENT_AMBULATORY_CARE_PROVIDER_SITE_OTHER): Payer: Medicaid Other | Admitting: Cardiology

## 2012-11-27 ENCOUNTER — Encounter: Payer: Self-pay | Admitting: Cardiology

## 2012-11-27 VITALS — Ht 62.0 in | Wt 209.1 lb

## 2012-11-27 DIAGNOSIS — E785 Hyperlipidemia, unspecified: Secondary | ICD-10-CM

## 2012-11-27 DIAGNOSIS — R0989 Other specified symptoms and signs involving the circulatory and respiratory systems: Secondary | ICD-10-CM

## 2012-11-27 DIAGNOSIS — I219 Acute myocardial infarction, unspecified: Secondary | ICD-10-CM

## 2012-11-27 DIAGNOSIS — R079 Chest pain, unspecified: Secondary | ICD-10-CM

## 2012-11-27 DIAGNOSIS — I350 Nonrheumatic aortic (valve) stenosis: Secondary | ICD-10-CM

## 2012-11-27 DIAGNOSIS — I2510 Atherosclerotic heart disease of native coronary artery without angina pectoris: Secondary | ICD-10-CM

## 2012-11-27 DIAGNOSIS — I359 Nonrheumatic aortic valve disorder, unspecified: Secondary | ICD-10-CM

## 2012-11-27 DIAGNOSIS — I10 Essential (primary) hypertension: Secondary | ICD-10-CM

## 2012-11-27 NOTE — Patient Instructions (Addendum)
,  Your physician recommends that you schedule a follow-up appointment in: ONE YEAR WITH TW  Your physician has requested that you have an echocardiogram. Echocardiography is a painless test that uses sound waves to create images of your heart. It provides your doctor with information about the size and shape of your heart and how well your heart's chambers and valves are working. This procedure takes approximately one hour. There are no restrictions for this procedure.

## 2012-11-27 NOTE — Progress Notes (Signed)
HPI [name] [name] returns today for the evaluation and management of increasing dyspnea on exertion. She has a history of mild aortic stenosis by 2-D echocardiogram, last assessed in October of 2012. She also has a history of coronary disease with a negative stress nuclear study at that time as well.   the dyspnea on exertion has increased with chronic nonproductive cough since she had pneumonia last one her. She's been evaluated by pulmonary with Dr. Shelle Iron who did not feel she had any COPD. PFTs were relatively normal with no airflow obstruction. He felt this might be cardiac or chronic thromboembolic disease. She is referred today to reevaluate the symptoms it is related to congestive heart failure.  She's always had good LV function, systolic standpoint. She is overweight and chronically deconditioned which may be a contributing factor.  She denies orthopnea, PND or edema. She has chest pain which starts on a right-sided posterior left which is not sound like cardiac. Is not related to activity or exertion.  Past Medical History  Diagnosis Date  . Anxiety   . Depression   . Hypertension   . GERD (gastroesophageal reflux disease)   . Hyperlipidemia   . Myocardial infarction   . Stroke     times 2    Current Outpatient Prescriptions  Medication Sig Dispense Refill  . albuterol (PROVENTIL HFA;VENTOLIN HFA) 108 (90 BASE) MCG/ACT inhaler Inhale 2 puffs into the lungs every 6 (six) hours as needed.        . ALPRAZolam (XANAX) 0.5 MG tablet Take 0.5 mg by mouth at bedtime as needed.        Marland Kitchen amitriptyline (ELAVIL) 25 MG tablet Take 25 mg by mouth at bedtime.        Marland Kitchen atorvastatin (LIPITOR) 20 MG tablet Take 1 tablet (20 mg total) by mouth daily.  30 tablet  9  . clopidogrel (PLAVIX) 75 MG tablet Take 1 tablet (75 mg total) by mouth daily.  30 tablet  6  . esomeprazole (NEXIUM) 40 MG capsule Take 40 mg by mouth daily before breakfast.        . furosemide (LASIX) 20 MG tablet Take 20 mg by  mouth daily.      Marland Kitchen HYDROcodone-acetaminophen (NORCO) 7.5-325 MG per tablet Take 1 tablet by mouth every 6 (six) hours as needed. 10/500mg       . lisinopril (PRINIVIL,ZESTRIL) 2.5 MG tablet Take 2.5 mg by mouth daily.      . metoprolol (TOPROL-XL) 100 MG 24 hr tablet Take 100 mg by mouth daily.        Marland Kitchen NITROSTAT 0.4 MG SL tablet As needed//as directed      . zolpidem (AMBIEN) 10 MG tablet Take 10 mg by mouth at bedtime as needed.          Allergies  Allergen Reactions  . Aspirin   . Penicillins   . Propoxyphene-Acetaminophen     Family History  Problem Relation Age of Onset  . Cancer Other   . Diabetes Other   . Abnormal EKG Other   . Arthritis Other   . Asthma Other   . Kidney disease Other     History   Social History  . Marital Status: Widowed    Spouse Name: N/A    Number of Children: N/A  . Years of Education: N/A   Occupational History  . disabled    Social History Main Topics  . Smoking status: Former Smoker    Types: Cigarettes    Quit date:  08/18/1972  . Smokeless tobacco: Current User    Types: Snuff     Comment: 1-2 cigs daily x 6 months  . Alcohol Use: No  . Drug Use: No  . Sexually Active: Not on file   Other Topics Concern  . Not on file   Social History Narrative  . No narrative on file    ROS ALL NEGATIVE EXCEPT THOSE NOTED IN HPI  PE  General Appearance: well developed, well nourished in no acute distress, morbidly obese HEENT: symmetrical face, PERRLA, white exudate over her gums Neck: no JVD, thyromegaly, or adenopathy, trachea midline Chest: symmetric without deformity Cardiac: PMI non-displaced, RRR, normal S1, S2, no gallop or murmur Lung: clear to ausculation and percussion Vascular: all pulses full without bruits  Abdominal: nondistended, nontender, good bowel sounds, no HSM, no bruits Extremities: no cyanosis, clubbing or edema, no sign of DVT, no varicosities  Skin: normal color, no rashes Neuro: alert and oriented x 3,  non-focal Pysch: normal affect  EKG Normal sinus rhythm, low-voltage. BMET    Component Value Date/Time   NA 136 04/04/2010 0501   K 4.9 04/04/2010 0501   CL 102 04/04/2010 0501   CO2 29 04/04/2010 0501   GLUCOSE 111* 04/04/2010 0501   BUN 40* 04/04/2010 0501   CREATININE 1.51* 04/04/2010 0501   CALCIUM 9.6 04/04/2010 0501   GFRNONAA 34* 04/04/2010 0501   GFRAA  Value: 41        The eGFR has been calculated using the MDRD equation. This calculation has not been validated in all clinical situations. eGFR's persistently <60 mL/min signify possible Chronic Kidney Disease.* 04/04/2010 0501    Lipid Panel     Component Value Date/Time   CHOL 241* 08/11/2011 0955   TRIG 99 08/11/2011 0955   HDL 80 08/11/2011 0955   CHOLHDL 3.0 08/11/2011 0955   VLDL 20 08/11/2011 0955   LDLCALC 141* 08/11/2011 0955    CBC    Component Value Date/Time   WBC 10.6* 04/04/2010 0501   RBC 4.06 04/04/2010 0501   HGB 12.1 04/04/2010 0501   HCT 35.6* 04/04/2010 0501   PLT 137* 04/04/2010 0501   MCV 87.7 04/04/2010 0501   MCHC 34.0 04/04/2010 0501   RDW 13.6 04/04/2010 0501   LYMPHSABS 1.4 04/04/2010 0501   MONOABS 0.8 04/04/2010 0501   EOSABS 0.0 04/04/2010 0501   BASOSABS 0.0 04/04/2010 0501

## 2012-11-27 NOTE — Assessment & Plan Note (Signed)
Stable. Continue secondary preventative therapy. No indication to repeat stress nuclear study. Last one was normal

## 2012-11-27 NOTE — Assessment & Plan Note (Signed)
In my opinion, this is not coronary. It could be related to aortic stenosis but I doubt it.

## 2012-11-27 NOTE — Assessment & Plan Note (Signed)
This is most likely worse. It could explain her symptoms. We'll obtain 2-D echocardiogram.

## 2012-11-27 NOTE — Assessment & Plan Note (Signed)
We need to make sure that her aortic stenosis has not progressed. We'll obtain 2-D echocardiogram. We can also assess her LV function as well as her right-sided function. I'll not clear her for any dental work until we had this test done.

## 2012-11-29 ENCOUNTER — Ambulatory Visit (HOSPITAL_COMMUNITY): Admission: RE | Admit: 2012-11-29 | Payer: Medicaid Other | Source: Ambulatory Visit

## 2012-12-04 ENCOUNTER — Ambulatory Visit (HOSPITAL_COMMUNITY)
Admission: RE | Admit: 2012-12-04 | Discharge: 2012-12-04 | Disposition: A | Discharge status: Home / Self Care | Payer: Medicare Other | Source: Ambulatory Visit | Attending: Cardiology | Admitting: Cardiology

## 2012-12-04 DIAGNOSIS — I350 Nonrheumatic aortic (valve) stenosis: Secondary | ICD-10-CM

## 2012-12-04 DIAGNOSIS — I219 Acute myocardial infarction, unspecified: Secondary | ICD-10-CM

## 2012-12-04 DIAGNOSIS — R0989 Other specified symptoms and signs involving the circulatory and respiratory systems: Secondary | ICD-10-CM | POA: Insufficient documentation

## 2012-12-04 DIAGNOSIS — I359 Nonrheumatic aortic valve disorder, unspecified: Secondary | ICD-10-CM | POA: Insufficient documentation

## 2012-12-04 DIAGNOSIS — R079 Chest pain, unspecified: Secondary | ICD-10-CM | POA: Insufficient documentation

## 2012-12-04 DIAGNOSIS — E785 Hyperlipidemia, unspecified: Secondary | ICD-10-CM | POA: Insufficient documentation

## 2012-12-04 DIAGNOSIS — R0609 Other forms of dyspnea: Secondary | ICD-10-CM | POA: Insufficient documentation

## 2012-12-04 DIAGNOSIS — I517 Cardiomegaly: Secondary | ICD-10-CM

## 2012-12-04 DIAGNOSIS — I10 Essential (primary) hypertension: Secondary | ICD-10-CM

## 2012-12-04 DIAGNOSIS — I2510 Atherosclerotic heart disease of native coronary artery without angina pectoris: Secondary | ICD-10-CM | POA: Insufficient documentation

## 2012-12-04 NOTE — Progress Notes (Signed)
*  PRELIMINARY RESULTS* Echocardiogram 2D Echocardiogram has been performed.  [name] [name] 12/04/2012, 1:31 PM

## 2012-12-12 ENCOUNTER — Other Ambulatory Visit: Payer: Self-pay | Admitting: *Deleted

## 2012-12-12 DIAGNOSIS — I2510 Atherosclerotic heart disease of native coronary artery without angina pectoris: Secondary | ICD-10-CM

## 2013-03-14 ENCOUNTER — Encounter (HOSPITAL_COMMUNITY): Payer: Self-pay | Admitting: Pharmacy Technician

## 2013-03-15 NOTE — Patient Instructions (Addendum)
Your procedure is scheduled on:  03/20/2013  Report to [facility] at 800  AM.  Call this number if you have problems the morning of surgery: [phone]   Do not eat food or drink liquids :After Midnight.      Take these medicines the morning of surgery with A SIP OF WATER: xanax,norco,elavil,nexium,lisinopril,metoprolol. Take  Albuterol inhaler before you come.   Do not wear jewelry, make-up or nail polish.  Do not wear lotions, powders, or perfumes.   Do not shave 48 hours prior to surgery.  Do not bring valuables to the hospital.  Contacts, dentures or bridgework may not be worn into surgery.  Leave suitcase in the car. After surgery it may be brought to your room.  For patients admitted to the hospital, checkout time is 11:00 AM the day of discharge.   Patients discharged the day of surgery will not be allowed to drive home.  :     Please read over the following fact sheets that you were given: Coughing and Deep Breathing, Surgical Site Infection Prevention, Anesthesia Post-op Instructions and Care and Recovery After Surgery    Cataract A cataract is a clouding of the lens of the eye. When a lens becomes cloudy, vision is reduced based on the degree and nature of the clouding. Many cataracts reduce vision to some degree. Some cataracts make people more near-sighted as they develop. Other cataracts increase glare. Cataracts that are ignored and become worse can sometimes look white. The white color can be seen through the pupil. CAUSES   Aging. However, cataracts may occur at any age, even in newborns.   Certain drugs.   Trauma to the eye.   Certain diseases such as diabetes.   Specific eye diseases such as chronic inflammation inside the eye or a sudden attack of a rare form of glaucoma.   Inherited or acquired medical problems.  SYMPTOMS   Gradual, progressive drop in vision in the affected eye.   Severe, rapid visual loss. This most often happens when trauma is the cause.   DIAGNOSIS  To detect a cataract, an eye doctor examines the lens. Cataracts are best diagnosed with an exam of the eyes with the pupils enlarged (dilated) by drops.  TREATMENT  For an early cataract, vision may improve by using different eyeglasses or stronger lighting. If that does not help your vision, surgery is the only effective treatment. A cataract needs to be surgically removed when vision loss interferes with your everyday activities, such as driving, reading, or watching TV. A cataract may also have to be removed if it prevents examination or treatment of another eye problem. Surgery removes the cloudy lens and usually replaces it with a substitute lens (intraocular lens, IOL).  At a time when both you and your doctor agree, the cataract will be surgically removed. If you have cataracts in both eyes, only one is usually removed at a time. This allows the operated eye to heal and be out of danger from any possible problems after surgery (such as infection or poor wound healing). In rare cases, a cataract may be doing damage to your eye. In these cases, your caregiver may advise surgical removal right away. The vast majority of people who have cataract surgery have better vision afterward. HOME CARE INSTRUCTIONS  If you are not planning surgery, you may be asked to do the following:  Use different eyeglasses.   Use stronger or brighter lighting.   Ask your eye doctor about reducing  your medicine dose or changing medicines if it is thought that a medicine caused your cataract. Changing medicines does not make the cataract go away on its own.   Become familiar with your surroundings. Poor vision can lead to injury. Avoid bumping into things on the affected side. You are at a higher risk for tripping or falling.   Exercise extreme care when driving or operating machinery.   Wear sunglasses if you are sensitive to bright light or experiencing problems with glare.  SEEK IMMEDIATE MEDICAL CARE  IF:   You have a worsening or sudden vision loss.   You notice redness, swelling, or increasing pain in the eye.   You have a fever.  Document Released: 10/25/2005 Document Revised: 10/14/2011 Document Reviewed: 06/18/2011 [facility] Patient Information 2012 [place].PATIENT INSTRUCTIONS POST-ANESTHESIA  IMMEDIATELY FOLLOWING SURGERY:  Do not drive or operate machinery for the first twenty four hours after surgery.  Do not make any important decisions for twenty four hours after surgery or while taking narcotic pain medications or sedatives.  If you develop intractable nausea and vomiting or a severe headache please notify your doctor immediately.  FOLLOW-UP:  Please make an appointment with your surgeon as instructed. You do not need to follow up with anesthesia unless specifically instructed to do so.  WOUND CARE INSTRUCTIONS (if applicable):  Keep a dry clean dressing on the anesthesia/puncture wound site if there is drainage.  Once the wound has quit draining you may leave it open to air.  Generally you should leave the bandage intact for twenty four hours unless there is drainage.  If the epidural site drains for more than 36-48 hours please call the anesthesia department.  QUESTIONS?:  Please feel free to call your physician or the hospital operator if you have any questions, and they will be happy to assist you.

## 2013-03-16 ENCOUNTER — Encounter (HOSPITAL_COMMUNITY)
Admission: RE | Admit: 2013-03-16 | Discharge: 2013-03-16 | Disposition: A | Discharge status: Home / Self Care | Payer: Medicare Other | Source: Ambulatory Visit | Admitting: Ophthalmology

## 2013-03-19 MED ORDER — FLURBIPROFEN SODIUM 0.03 % OP SOLN
OPHTHALMIC | Status: AC
  Filled 2013-03-19: qty 2.5

## 2013-03-19 MED ORDER — CYCLOPENTOLATE-PHENYLEPHRINE 0.2-1 % OP SOLN
OPHTHALMIC | Status: AC
  Filled 2013-03-19: qty 2

## 2013-03-19 MED ORDER — PHENYLEPHRINE HCL 2.5 % OP SOLN
OPHTHALMIC | Status: AC
  Filled 2013-03-19: qty 2

## 2013-03-19 MED ORDER — TETRACAINE HCL 0.5 % OP SOLN
OPHTHALMIC | Status: AC
  Filled 2013-03-19: qty 2

## 2013-03-20 ENCOUNTER — Encounter (HOSPITAL_COMMUNITY)
Admission: RE | Disposition: A | Discharge status: Home / Self Care | Payer: Self-pay | Source: Ambulatory Visit | Attending: Ophthalmology

## 2013-03-20 ENCOUNTER — Encounter (HOSPITAL_COMMUNITY): Payer: Self-pay | Admitting: *Deleted

## 2013-03-20 ENCOUNTER — Ambulatory Visit (HOSPITAL_COMMUNITY): Payer: Medicare Other | Admitting: Anesthesiology

## 2013-03-20 ENCOUNTER — Encounter (HOSPITAL_COMMUNITY): Payer: Self-pay | Admitting: Anesthesiology

## 2013-03-20 ENCOUNTER — Ambulatory Visit (HOSPITAL_COMMUNITY)
Admission: RE | Admit: 2013-03-20 | Discharge: 2013-03-20 | Disposition: A | Discharge status: Home / Self Care | Payer: Medicare Other | Source: Ambulatory Visit | Attending: Ophthalmology | Admitting: Ophthalmology

## 2013-03-20 DIAGNOSIS — I10 Essential (primary) hypertension: Secondary | ICD-10-CM | POA: Insufficient documentation

## 2013-03-20 DIAGNOSIS — H2510 Age-related nuclear cataract, unspecified eye: Secondary | ICD-10-CM | POA: Insufficient documentation

## 2013-03-20 DIAGNOSIS — Z01812 Encounter for preprocedural laboratory examination: Secondary | ICD-10-CM | POA: Insufficient documentation

## 2013-03-20 HISTORY — PX: CATARACT EXTRACTION W/PHACO: SHX586

## 2013-03-20 LAB — POCT I-STAT 4, (NA,K, GLUC, HGB,HCT)
Hemoglobin: 15 g/dL (ref 12.0–15.0)
Sodium: 141 mEq/L (ref 135–145)

## 2013-03-20 SURGERY — PHACOEMULSIFICATION, CATARACT, WITH IOL INSERTION
Anesthesia: Monitor Anesthesia Care | Site: Eye | Laterality: Right | Wound class: Clean

## 2013-03-20 MED ORDER — LACTATED RINGERS IV SOLN
INTRAVENOUS | Status: DC
  Administered 2013-03-20: 1000 mL via INTRAVENOUS

## 2013-03-20 MED ORDER — FLURBIPROFEN SODIUM 0.03 % OP SOLN
1.0000 [drp] | OPHTHALMIC | Status: AC
  Administered 2013-03-20 (×3): 1 [drp] via OPHTHALMIC

## 2013-03-20 MED ORDER — BSS IO SOLN
INTRAOCULAR | Status: DC | PRN
  Administered 2013-03-20: 15 mL via INTRAOCULAR

## 2013-03-20 MED ORDER — MIDAZOLAM HCL 2 MG/2ML IJ SOLN
INTRAMUSCULAR | Status: AC
  Filled 2013-03-20: qty 2

## 2013-03-20 MED ORDER — CYCLOPENTOLATE-PHENYLEPHRINE 0.2-1 % OP SOLN
1.0000 [drp] | OPHTHALMIC | Status: AC
  Administered 2013-03-20 (×3): 1 [drp] via OPHTHALMIC

## 2013-03-20 MED ORDER — EPINEPHRINE HCL 1 MG/ML IJ SOLN
INTRAMUSCULAR | Status: AC
  Filled 2013-03-20: qty 1

## 2013-03-20 MED ORDER — TETRACAINE HCL 0.5 % OP SOLN
1.0000 [drp] | OPHTHALMIC | Status: AC
  Administered 2013-03-20 (×3): 1 [drp] via OPHTHALMIC

## 2013-03-20 MED ORDER — MIDAZOLAM HCL 2 MG/2ML IJ SOLN
1.0000 mg | INTRAMUSCULAR | Status: DC | PRN
  Administered 2013-03-20: 2 mg via INTRAVENOUS

## 2013-03-20 MED ORDER — EPINEPHRINE HCL 1 MG/ML IJ SOLN
INTRAOCULAR | Status: DC | PRN
  Administered 2013-03-20: 09:00:00

## 2013-03-20 MED ORDER — PHENYLEPHRINE HCL 2.5 % OP SOLN
1.0000 [drp] | OPHTHALMIC | Status: AC
  Administered 2013-03-20 (×3): 1 [drp] via OPHTHALMIC

## 2013-03-20 MED ORDER — FENTANYL CITRATE 0.05 MG/ML IJ SOLN: 25.0000 ug | INTRAMUSCULAR | Status: DC | PRN

## 2013-03-20 MED ORDER — ONDANSETRON HCL 4 MG/2ML IJ SOLN: 4.0000 mg | Freq: Once | INTRAMUSCULAR | Status: DC | PRN

## 2013-03-20 SURGICAL SUPPLY — 23 items

## 2013-03-20 NOTE — Anesthesia Postprocedure Evaluation (Signed)
  Anesthesia Post-op Note  Patient: [name] [name]  Procedure(s) Performed: Procedure(s) with comments: CATARACT EXTRACTION PHACO AND INTRAOCULAR LENS PLACEMENT (IOC) (Right) - CDE:  6.29   Patient Location: PACU and Short Stay  Anesthesia Type:MAC  Level of Consciousness: awake, alert  and oriented  Airway and Oxygen Therapy: Patient Spontanous Breathing  Post-op Pain: none  Post-op Assessment: Post-op Vital signs reviewed, Patient's Cardiovascular Status Stable, Respiratory Function Stable, Patent Airway and No signs of Nausea or vomiting  Post-op Vital Signs: Reviewed and stable  Complications: No apparent anesthesia complications

## 2013-03-20 NOTE — H&P (Signed)
The patient was re examined and there is no change in the patients condition since the original H and P. 

## 2013-03-20 NOTE — Op Note (Signed)
Patient brought to the operating room and prepped and draped in the usual manner.  Lid speculum inserted in right eye.  Stab incision made at the twelve o'clock position.  Provisc instilled in the anterior chamber.   A 2.4 mm. Stab incision was made temporally.  An anterior capsulotomy was done with a bent 25 gauge needle.  The nucleus was hydrodissected.  The Phaco tip was inserted in the anterior chamber and the nucleus was emulsified.  CDE was 6.29.  The cortical material was then removed with the I and A tip.  Posterior capsule was the polished.  The anterior chamber was deepened with Provisc.  A 22.0 Diopter Rayner 570C IOL was then inserted in the capsular bag.  Provisc was then removed with the I and A tip.  The wound was then hydrated.  Patient sent to the Recovery Room in good condition with follow up in my office.

## 2013-03-20 NOTE — Anesthesia Preprocedure Evaluation (Signed)
Anesthesia Evaluation  Patient identified by MRN, date of birth, ID band Patient awake    Reviewed: Allergy & Precautions, H&P , NPO status , Patient's Chart, lab work & pertinent test results  Airway Mallampati: III TM Distance: >3 FB   Mouth opening: Limited Mouth Opening  Dental  (+) Edentulous Upper and Edentulous Lower   Pulmonary shortness of breath and with exertion, former smoker,    + decreased breath sounds      Cardiovascular hypertension, Pt. on medications + CAD, + Past MI and + DOE Rhythm:Regular Rate:Normal     Neuro/Psych PSYCHIATRIC DISORDERS Anxiety Depression CVA, Residual Symptoms    GI/Hepatic GERD-  Medicated and Controlled,  Endo/Other    Renal/GU      Musculoskeletal   Abdominal   Peds  Hematology   Anesthesia Other Findings   Reproductive/Obstetrics                           Anesthesia Physical Anesthesia Plan  ASA: III  Anesthesia Plan: MAC   Post-op Pain Management:    Induction: Intravenous  Airway Management Planned: Nasal Cannula  Additional Equipment:   Intra-op Plan:   Post-operative Plan:   Informed Consent: I have reviewed the patients History and Physical, chart, labs and discussed the procedure including the risks, benefits and alternatives for the proposed anesthesia with the patient or authorized representative who has indicated his/her understanding and acceptance.     Plan Discussed with:   Anesthesia Plan Comments:         Anesthesia Quick Evaluation

## 2013-03-20 NOTE — Transfer of Care (Signed)
Immediate Anesthesia Transfer of Care Note  Patient: [name] [name]  Procedure(s) Performed: Procedure(s) with comments: CATARACT EXTRACTION PHACO AND INTRAOCULAR LENS PLACEMENT (IOC) (Right) - CDE:  6.29   Patient Location: PACU and Short Stay  Anesthesia Type:MAC  Level of Consciousness: awake  Airway & Oxygen Therapy: Patient Spontanous Breathing  Post-op Assessment: Report given to PACU RN  Post vital signs: Reviewed  Complications: No apparent anesthesia complications

## 2013-03-22 ENCOUNTER — Encounter (HOSPITAL_COMMUNITY): Payer: Self-pay | Admitting: Ophthalmology

## 2013-07-20 ENCOUNTER — Other Ambulatory Visit: Payer: Self-pay | Admitting: Physician Assistant

## 2013-10-16 ENCOUNTER — Other Ambulatory Visit (HOSPITAL_COMMUNITY): Payer: Self-pay | Admitting: Internal Medicine

## 2013-10-16 ENCOUNTER — Ambulatory Visit (HOSPITAL_COMMUNITY)
Admission: RE | Admit: 2013-10-16 | Discharge: 2013-10-16 | Disposition: A | Discharge status: Home / Self Care | Payer: Medicare Other | Source: Ambulatory Visit | Attending: Internal Medicine | Admitting: Internal Medicine

## 2013-10-16 DIAGNOSIS — R05 Cough: Secondary | ICD-10-CM

## 2013-10-16 DIAGNOSIS — Z87891 Personal history of nicotine dependence: Secondary | ICD-10-CM | POA: Insufficient documentation

## 2013-10-16 DIAGNOSIS — R059 Cough, unspecified: Secondary | ICD-10-CM | POA: Insufficient documentation

## 2013-10-16 DIAGNOSIS — I517 Cardiomegaly: Secondary | ICD-10-CM | POA: Insufficient documentation

## 2013-10-26 ENCOUNTER — Other Ambulatory Visit (HOSPITAL_COMMUNITY): Payer: Self-pay | Admitting: Internal Medicine

## 2013-10-26 DIAGNOSIS — R51 Headache: Secondary | ICD-10-CM

## 2013-10-29 ENCOUNTER — Ambulatory Visit (HOSPITAL_COMMUNITY)
Admission: RE | Admit: 2013-10-29 | Discharge: 2013-10-29 | Disposition: A | Discharge status: Home / Self Care | Payer: PRIVATE HEALTH INSURANCE | Source: Ambulatory Visit | Attending: Internal Medicine | Admitting: Internal Medicine

## 2013-10-29 DIAGNOSIS — G939 Disorder of brain, unspecified: Secondary | ICD-10-CM | POA: Insufficient documentation

## 2013-10-29 DIAGNOSIS — R51 Headache: Secondary | ICD-10-CM | POA: Insufficient documentation

## 2013-10-30 ENCOUNTER — Other Ambulatory Visit (HOSPITAL_COMMUNITY): Payer: Medicare Other

## 2013-12-11 ENCOUNTER — Other Ambulatory Visit: Payer: Self-pay | Admitting: Neurology

## 2013-12-11 DIAGNOSIS — G459 Transient cerebral ischemic attack, unspecified: Secondary | ICD-10-CM

## 2013-12-13 ENCOUNTER — Ambulatory Visit (HOSPITAL_COMMUNITY)
Admission: RE | Admit: 2013-12-13 | Discharge: 2013-12-13 | Disposition: A | Discharge status: Home / Self Care | Payer: PRIVATE HEALTH INSURANCE | Source: Ambulatory Visit | Attending: Neurology | Admitting: Neurology

## 2013-12-13 DIAGNOSIS — G459 Transient cerebral ischemic attack, unspecified: Secondary | ICD-10-CM

## 2013-12-13 DIAGNOSIS — Z8673 Personal history of transient ischemic attack (TIA), and cerebral infarction without residual deficits: Secondary | ICD-10-CM | POA: Insufficient documentation

## 2014-03-10 ENCOUNTER — Emergency Department (HOSPITAL_COMMUNITY): Payer: PRIVATE HEALTH INSURANCE

## 2014-03-10 ENCOUNTER — Observation Stay (HOSPITAL_COMMUNITY)
Admission: EM | Admit: 2014-03-10 | Discharge: 2014-03-12 | Disposition: A | Discharge status: Home Health | Payer: PRIVATE HEALTH INSURANCE | Attending: Internal Medicine | Admitting: Internal Medicine

## 2014-03-10 ENCOUNTER — Encounter (HOSPITAL_COMMUNITY): Payer: Self-pay | Admitting: Emergency Medicine

## 2014-03-10 DIAGNOSIS — F411 Generalized anxiety disorder: Secondary | ICD-10-CM | POA: Insufficient documentation

## 2014-03-10 DIAGNOSIS — M25519 Pain in unspecified shoulder: Secondary | ICD-10-CM | POA: Insufficient documentation

## 2014-03-10 DIAGNOSIS — Z720 Tobacco use: Secondary | ICD-10-CM

## 2014-03-10 DIAGNOSIS — Z7902 Long term (current) use of antithrombotics/antiplatelets: Secondary | ICD-10-CM | POA: Insufficient documentation

## 2014-03-10 DIAGNOSIS — I2510 Atherosclerotic heart disease of native coronary artery without angina pectoris: Secondary | ICD-10-CM | POA: Diagnosis present

## 2014-03-10 DIAGNOSIS — M75120 Complete rotator cuff tear or rupture of unspecified shoulder, not specified as traumatic: Secondary | ICD-10-CM

## 2014-03-10 DIAGNOSIS — Y92009 Unspecified place in unspecified non-institutional (private) residence as the place of occurrence of the external cause: Secondary | ICD-10-CM | POA: Insufficient documentation

## 2014-03-10 DIAGNOSIS — R079 Chest pain, unspecified: Secondary | ICD-10-CM

## 2014-03-10 DIAGNOSIS — E119 Type 2 diabetes mellitus without complications: Secondary | ICD-10-CM | POA: Insufficient documentation

## 2014-03-10 DIAGNOSIS — I252 Old myocardial infarction: Secondary | ICD-10-CM | POA: Insufficient documentation

## 2014-03-10 DIAGNOSIS — W010XXA Fall on same level from slipping, tripping and stumbling without subsequent striking against object, initial encounter: Secondary | ICD-10-CM | POA: Insufficient documentation

## 2014-03-10 DIAGNOSIS — F3289 Other specified depressive episodes: Secondary | ICD-10-CM | POA: Insufficient documentation

## 2014-03-10 DIAGNOSIS — Z87891 Personal history of nicotine dependence: Secondary | ICD-10-CM | POA: Insufficient documentation

## 2014-03-10 DIAGNOSIS — S92515D Nondisplaced fracture of proximal phalanx of left lesser toe(s), subsequent encounter for fracture with routine healing: Secondary | ICD-10-CM

## 2014-03-10 DIAGNOSIS — K219 Gastro-esophageal reflux disease without esophagitis: Secondary | ICD-10-CM | POA: Insufficient documentation

## 2014-03-10 DIAGNOSIS — S93401A Sprain of unspecified ligament of right ankle, initial encounter: Secondary | ICD-10-CM

## 2014-03-10 DIAGNOSIS — S7002XA Contusion of left hip, initial encounter: Secondary | ICD-10-CM | POA: Diagnosis present

## 2014-03-10 DIAGNOSIS — Z789 Other specified health status: Secondary | ICD-10-CM

## 2014-03-10 DIAGNOSIS — N179 Acute kidney failure, unspecified: Secondary | ICD-10-CM | POA: Diagnosis present

## 2014-03-10 DIAGNOSIS — R0609 Other forms of dyspnea: Secondary | ICD-10-CM

## 2014-03-10 DIAGNOSIS — S93409A Sprain of unspecified ligament of unspecified ankle, initial encounter: Secondary | ICD-10-CM | POA: Insufficient documentation

## 2014-03-10 DIAGNOSIS — I10 Essential (primary) hypertension: Secondary | ICD-10-CM | POA: Diagnosis present

## 2014-03-10 DIAGNOSIS — N183 Chronic kidney disease, stage 3 unspecified: Secondary | ICD-10-CM | POA: Insufficient documentation

## 2014-03-10 DIAGNOSIS — N189 Chronic kidney disease, unspecified: Secondary | ICD-10-CM

## 2014-03-10 DIAGNOSIS — Z8673 Personal history of transient ischemic attack (TIA), and cerebral infarction without residual deficits: Secondary | ICD-10-CM | POA: Insufficient documentation

## 2014-03-10 DIAGNOSIS — F32A Depression, unspecified: Secondary | ICD-10-CM | POA: Diagnosis present

## 2014-03-10 DIAGNOSIS — F329 Major depressive disorder, single episode, unspecified: Secondary | ICD-10-CM | POA: Insufficient documentation

## 2014-03-10 DIAGNOSIS — F419 Anxiety disorder, unspecified: Secondary | ICD-10-CM | POA: Diagnosis present

## 2014-03-10 DIAGNOSIS — M25559 Pain in unspecified hip: Secondary | ICD-10-CM | POA: Diagnosis present

## 2014-03-10 DIAGNOSIS — I129 Hypertensive chronic kidney disease with stage 1 through stage 4 chronic kidney disease, or unspecified chronic kidney disease: Secondary | ICD-10-CM | POA: Insufficient documentation

## 2014-03-10 DIAGNOSIS — W19XXXA Unspecified fall, initial encounter: Secondary | ICD-10-CM

## 2014-03-10 DIAGNOSIS — S7000XA Contusion of unspecified hip, initial encounter: Principal | ICD-10-CM | POA: Insufficient documentation

## 2014-03-10 DIAGNOSIS — I639 Cerebral infarction, unspecified: Secondary | ICD-10-CM

## 2014-03-10 DIAGNOSIS — E785 Hyperlipidemia, unspecified: Secondary | ICD-10-CM | POA: Diagnosis present

## 2014-03-10 DIAGNOSIS — I219 Acute myocardial infarction, unspecified: Secondary | ICD-10-CM

## 2014-03-10 HISTORY — DX: Type 2 diabetes mellitus without complications: E11.9

## 2014-03-10 LAB — CBC WITH DIFFERENTIAL/PLATELET
BASOS PCT: 0 % (ref 0–1)
Basophils Absolute: 0 10*3/uL (ref 0.0–0.1)
Eosinophils Absolute: 0.2 10*3/uL (ref 0.0–0.7)
Eosinophils Relative: 2 % (ref 0–5)
HCT: 39.9 % (ref 36.0–46.0)
HEMOGLOBIN: 13.3 g/dL (ref 12.0–15.0)
Lymphocytes Relative: 28 % (ref 12–46)
Lymphs Abs: 2.4 10*3/uL (ref 0.7–4.0)
MCH: 29.3 pg (ref 26.0–34.0)
MCHC: 33.3 g/dL (ref 30.0–36.0)
MCV: 87.9 fL (ref 78.0–100.0)
MONOS PCT: 9 % (ref 3–12)
Monocytes Absolute: 0.7 10*3/uL (ref 0.1–1.0)
NEUTROS ABS: 5.2 10*3/uL (ref 1.7–7.7)
NEUTROS PCT: 61 % (ref 43–77)
PLATELETS: 226 10*3/uL (ref 150–400)
RBC: 4.54 MIL/uL (ref 3.87–5.11)
RDW: 12.8 % (ref 11.5–15.5)
WBC: 8.5 10*3/uL (ref 4.0–10.5)

## 2014-03-10 LAB — BASIC METABOLIC PANEL
BUN: 16 mg/dL (ref 6–23)
CALCIUM: 10.5 mg/dL (ref 8.4–10.5)
CO2: 25 mEq/L (ref 19–32)
Chloride: 104 mEq/L (ref 96–112)
Creatinine, Ser: 1.53 mg/dL — ABNORMAL HIGH (ref 0.50–1.10)
GFR, EST AFRICAN AMERICAN: 38 mL/min — AB (ref >90)
GFR, EST NON AFRICAN AMERICAN: 33 mL/min — AB (ref >90)
Glucose, Bld: 98 mg/dL (ref 70–99)
POTASSIUM: 4.4 meq/L (ref 3.7–5.3)
SODIUM: 141 meq/L (ref 137–147)

## 2014-03-10 MED ORDER — HEPARIN SODIUM (PORCINE) 5000 UNIT/ML IJ SOLN
5000.0000 [IU] | Freq: Three times a day (TID) | INTRAMUSCULAR | Status: DC
  Administered 2014-03-10 – 2014-03-12 (×5): 5000 [IU] via SUBCUTANEOUS
  Filled 2014-03-10 (×6): qty 1

## 2014-03-10 MED ORDER — ALPRAZOLAM 0.5 MG PO TABS
0.5000 mg | ORAL_TABLET | Freq: Every evening | ORAL | Status: DC | PRN
  Administered 2014-03-10 – 2014-03-11 (×2): 0.5 mg via ORAL
  Filled 2014-03-10 (×2): qty 1

## 2014-03-10 MED ORDER — CLOPIDOGREL BISULFATE 75 MG PO TABS
75.0000 mg | ORAL_TABLET | Freq: Every day | ORAL | Status: DC
  Administered 2014-03-10: 75 mg via ORAL
  Filled 2014-03-10: qty 1

## 2014-03-10 MED ORDER — LISINOPRIL 5 MG PO TABS
2.5000 mg | ORAL_TABLET | Freq: Every day | ORAL | Status: DC
  Filled 2014-03-10: qty 1

## 2014-03-10 MED ORDER — CLOPIDOGREL BISULFATE 75 MG PO TABS
75.0000 mg | ORAL_TABLET | Freq: Every day | ORAL | Status: DC
  Administered 2014-03-11: 75 mg via ORAL
  Filled 2014-03-10: qty 1

## 2014-03-10 MED ORDER — SODIUM CHLORIDE 0.9 % IV SOLN
INTRAVENOUS | Status: AC
  Administered 2014-03-10: 23:00:00 via INTRAVENOUS

## 2014-03-10 MED ORDER — SODIUM CHLORIDE 0.9 % IV BOLUS (SEPSIS)
500.0000 mL | Freq: Once | INTRAVENOUS | Status: AC
  Administered 2014-03-10: 500 mL via INTRAVENOUS

## 2014-03-10 MED ORDER — ALBUTEROL SULFATE (2.5 MG/3ML) 0.083% IN NEBU: 3.0000 mL | INHALATION_SOLUTION | Freq: Four times a day (QID) | RESPIRATORY_TRACT | Status: DC | PRN

## 2014-03-10 MED ORDER — MORPHINE SULFATE 4 MG/ML IJ SOLN
4.0000 mg | Freq: Once | INTRAMUSCULAR | Status: AC
  Administered 2014-03-10: 4 mg via INTRAVENOUS
  Filled 2014-03-10: qty 1

## 2014-03-10 MED ORDER — ESCITALOPRAM OXALATE 10 MG PO TABS
20.0000 mg | ORAL_TABLET | Freq: Every day | ORAL | Status: DC
  Administered 2014-03-11 – 2014-03-12 (×2): 20 mg via ORAL
  Filled 2014-03-10 (×2): qty 2

## 2014-03-10 MED ORDER — HYDROCODONE-ACETAMINOPHEN 5-325 MG PO TABS
2.0000 | ORAL_TABLET | Freq: Once | ORAL | Status: AC
  Administered 2014-03-10: 2 via ORAL
  Filled 2014-03-10: qty 2

## 2014-03-10 MED ORDER — ONDANSETRON HCL 4 MG/2ML IJ SOLN
4.0000 mg | Freq: Once | INTRAMUSCULAR | Status: AC
Start: 2014-03-10 — End: 2014-03-10
  Administered 2014-03-10: 4 mg via INTRAVENOUS
  Filled 2014-03-10: qty 2

## 2014-03-10 MED ORDER — PANTOPRAZOLE SODIUM 40 MG PO TBEC
40.0000 mg | DELAYED_RELEASE_TABLET | Freq: Every day | ORAL | Status: DC
  Administered 2014-03-10: 40 mg via ORAL
  Filled 2014-03-10: qty 1

## 2014-03-10 MED ORDER — EZETIMIBE 10 MG PO TABS
10.0000 mg | ORAL_TABLET | Freq: Every day | ORAL | Status: DC
  Administered 2014-03-11 – 2014-03-12 (×2): 10 mg via ORAL
  Filled 2014-03-10 (×2): qty 1

## 2014-03-10 MED ORDER — FUROSEMIDE 20 MG PO TABS
20.0000 mg | ORAL_TABLET | Freq: Every day | ORAL | Status: DC
  Administered 2014-03-11: 20 mg via ORAL
  Filled 2014-03-10: qty 1

## 2014-03-10 MED ORDER — HYDROCODONE-ACETAMINOPHEN 5-325 MG PO TABS
1.0000 | ORAL_TABLET | Freq: Four times a day (QID) | ORAL | Status: DC | PRN
  Administered 2014-03-10: 2 via ORAL
  Administered 2014-03-11 (×2): 1 via ORAL
  Filled 2014-03-10 (×2): qty 1
  Filled 2014-03-10: qty 2

## 2014-03-10 MED ORDER — PANTOPRAZOLE SODIUM 40 MG PO TBEC
40.0000 mg | DELAYED_RELEASE_TABLET | Freq: Every day | ORAL | Status: DC
  Administered 2014-03-11: 40 mg via ORAL
  Filled 2014-03-10: qty 1

## 2014-03-10 MED ORDER — METOPROLOL TARTRATE 50 MG PO TABS
50.0000 mg | ORAL_TABLET | Freq: Every day | ORAL | Status: DC
  Filled 2014-03-10: qty 1

## 2014-03-10 NOTE — ED Provider Notes (Addendum)
CSN: [id number]     Arrival date & time 03/10/14  1144 History  This chart was scribed for [name] Christen, MD by Roxan Diesel, ED scribe.  This patient was seen in room [facility]10/[facility]10 and the patient's care was started at 1:21 PM.   Chief Complaint  Patient presents with  . Fall    The history is provided by the patient. No language interpreter was used.    HPI Comments: [name] [name] is a 73 y.o. female who presents to the Emergency Department complaining of a fall that occurred this morning.  Pt states that she was walking through a doorway this morning when she tripped over her feet and fell with impact to her left hip.  She denies head impact or LOC.  Currently she complains of constant, moderate-to-severe pain to her right ankle, toes of left foot, left hip, and neck.  She denies knee pain.  Family deny behavioral changes since the fall.    Past Medical History  Diagnosis Date  . Anxiety   . Depression   . Hypertension   . GERD (gastroesophageal reflux disease)   . Hyperlipidemia   . Myocardial infarction   . Stroke     times 2  . Diabetes mellitus without complication     Past Surgical History  Procedure Laterality Date  . Abdominal hysterectomy    . Cholecystectomy    . Oophorectomy    . Colostomy    . Tonsillectomy    . Cataract extraction w/phaco Right 03/20/2013    Procedure: CATARACT EXTRACTION PHACO AND INTRAOCULAR LENS PLACEMENT (IOC);  Surgeon: Elta Guadeloupe T. Gershon Crane, MD;  Location: [facility];  Service: Ophthalmology;  Laterality: Right;  CDE:  6.29     Family History  Problem Relation Age of Onset  . Cancer Other   . Diabetes Other   . Abnormal EKG Other   . Arthritis Other   . Asthma Other   . Kidney disease Other     History  Substance Use Topics  . Smoking status: Former Smoker    Types: Cigarettes    Quit date: 08/18/1972  . Smokeless tobacco: Current User    Types: Snuff     Comment: 1-2 cigs daily x 6 months  . Alcohol Use: No    OB History    Grav Para Term Preterm Abortions TAB SAB Ect Mult Living                   Review of Systems A complete 10 system review of systems was obtained and all systems are negative except as noted in the HPI and PMH.     Allergies  Aspirin; Penicillins; and Propoxyphene n-acetaminophen  Home Medications   Prior to Admission medications   Medication Sig Start Date End Date Taking? Authorizing Provider  albuterol (PROVENTIL HFA;VENTOLIN HFA) 108 (90 BASE) MCG/ACT inhaler Inhale 2 puffs into the lungs every 6 (six) hours as needed for wheezing or shortness of breath.     Historical Provider, MD  ALPRAZolam Duanne Moron) 0.5 MG tablet Take 0.5 mg by mouth at bedtime as needed for sleep.     Historical Provider, MD  amitriptyline (ELAVIL) 25 MG tablet Take 25 mg by mouth at bedtime.      Historical Provider, MD  atorvastatin (LIPITOR) 20 MG tablet TAKE (1) TABLET BY MOUTH ONCE DAILY. 07/20/13   Lendon Colonel, NP  clopidogrel (PLAVIX) 75 MG tablet Take 1 tablet (75 mg total) by mouth daily. 09/26/12   Marijo Conception  Wall, MD  esomeprazole (NEXIUM) 40 MG capsule Take 40 mg by mouth daily before breakfast.      Historical Provider, MD  furosemide (LASIX) 20 MG tablet Take 20 mg by mouth daily.    Historical Provider, MD  HYDROcodone-acetaminophen (NORCO) 10-325 MG per tablet Take 1 tablet by mouth every 6 (six) hours as needed for pain.    Historical Provider, MD  lisinopril (PRINIVIL,ZESTRIL) 2.5 MG tablet Take 2.5 mg by mouth daily.    Historical Provider, MD  metoprolol succinate (TOPROL-XL) 50 MG 24 hr tablet Take 50 mg by mouth daily. Take with or immediately following a meal.    Historical Provider, MD  NITROSTAT 0.4 MG SL tablet Place 0.4 mg under the tongue every 5 (five) minutes as needed for chest pain.  07/28/12   Historical Provider, MD  zolpidem (AMBIEN) 10 MG tablet Take 10 mg by mouth at bedtime as needed for sleep.     Historical Provider, MD   BP 132/60  Pulse 62  Temp(Src) 97.9 F (36.6  C) (Oral)  Resp 16  Ht 5\' 2"  (1.575 m)  Wt 185 lb (83.915 kg)  BMI 33.83 kg/m2  SpO2 94%  Physical Exam  Nursing note and vitals reviewed. Constitutional: She is oriented to person, place, and time. She appears well-developed and well-nourished.  HENT:  Head: Normocephalic and atraumatic.  Eyes: Conjunctivae and EOM are normal. Pupils are equal, round, and reactive to light.  Neck: Normal range of motion. Neck supple.  Cardiovascular: Normal rate, regular rhythm and normal heart sounds.   Pulmonary/Chest: Effort normal and breath sounds normal.  Abdominal: Soft. Bowel sounds are normal.  Musculoskeletal: She exhibits tenderness.  Tender to right ankle and all 5 toes on left foot Tender to left posterior lateral hip  Neurological: She is alert and oriented to person, place, and time.  Skin: Skin is warm and dry.  Psychiatric: She has a normal mood and affect. Her behavior is normal.    ED Course  Procedures (including critical care time)  DIAGNOSTIC STUDIES: Oxygen Saturation is 94% on room air, adequate by my interpretation.    COORDINATION OF CARE: 1:25 PM-Discussed treatment plan which includes x-rays of right ankle, left foot, left hip, and cervical spine with pt at bedside and pt agreed to plan.     Dg Cervical Spine Complete  03/10/2014   CLINICAL DATA:  Bilateral neck pain following a fall.  EXAM: CERVICAL SPINE  4+ VIEWS  COMPARISON:  None.  FINDINGS: Cervicothoracic and upper thoracic spine degenerative changes. No prevertebral soft tissue swelling, fractures or subluxations. Diffuse osteopenia.  IMPRESSION: No fracture or subluxation.   Electronically Signed   By: Enrique Sack M.D.   On: 03/10/2014 15:00    Dg Hip Complete Left  03/10/2014   CLINICAL DATA:  Left hip pain following a fall. Chronic right hip pain.  EXAM: LEFT HIP - COMPLETE 2+ VIEW  COMPARISON:  None.  FINDINGS: There is no evidence of hip fracture or dislocation. There is no evidence of arthropathy or  other focal bone abnormality.  IMPRESSION: No fracture or dislocation.   Electronically Signed   By: Enrique Sack M.D.   On: 03/10/2014 15:02    Dg Ankle Complete Right  03/10/2014   CLINICAL DATA:  [place].  Injured ankle.  EXAM: RIGHT ANKLE - COMPLETE 3+ VIEW  COMPARISON:  None.  FINDINGS: The ankle mortise is maintained. No acute ankle fracture or osteochondral lesion. The mid and hindfoot bony structures are intact.  Calcaneal spurring changes are noted. Arterial calcifications are noted.  IMPRESSION: No acute fracture.   Electronically Signed   By: Kalman Jewels M.D.   On: 03/10/2014 14:53    Dg Foot Complete Left  03/10/2014   CLINICAL DATA:  Left foot pain.  EXAM: LEFT FOOT - COMPLETE 3+ VIEW  COMPARISON:  None.  FINDINGS: The bones are osteopenic. There is an age indeterminate fracture involving the head of the fifth proximal phalanx No dislocations. No radiopaque foreign bodies are soft tissue calcifications.  IMPRESSION: 1. Osteopenia. 2. Age indeterminate fracture involves the head of fifth proximal phalanx   Electronically Signed   By: Kerby Moors M.D.   On: 03/10/2014 14:50      MDM   Final diagnoses:  Fall  Contusion of left hip  Sprain of right ankle  Nondisp fx proximal phalanx lesser toe left foot w/routine heal    X-rays of right ankle, left hip, cervical spine all negative for fracture. X-ray of left foot shows a age indeterminate fracture of the head of the fifth proximal phalanx. Patient unable to bear weight on left hip. Admit to observation     I personally performed the services described in this documentation, which was scribed in my presence. The recorded information has been reviewed and is accurate.    [name] Christen, MD 03/10/14 [place], MD 03/11/14 [phone]

## 2014-03-10 NOTE — ED Notes (Signed)
Pt says she is unable to walk secondary to left hip, knee and toe pain. Lacinda Axon, MD notified.

## 2014-03-10 NOTE — ED Notes (Addendum)
Pt fell this morning after her leg "gave out." Reports Bilateral hip, knee and ankle pain. Left side more than right. Also c/o toe pain on left foot. No obvious deformity noted. Denies CP, SOB, dizziness. Denies head trauma, LOC.

## 2014-03-10 NOTE — H&P (Signed)
History and Physical    [name] [name] HKV:[id number] DOB: 27-Dec-1940 DOA: 03/10/2014  Referring physician: Dr. Lacinda Axon PCP: Glo Herring., MD  Specialists: none  Chief Complaint: Left hip pain  HPI: [name] [name] is a 73 y.o. female has a past medical history significant for coronary artery disease, previous CVAs, diabetes, hyperlipidemia, hypertension presents to the emergency room complaining of fall that occurred this morning. Patient denies any chest pain, lightheadedness or dizziness, and she tripped and fell on her left hip. Since then, she endorses severe pain, and she is unable to bear any weight on the left side. She endorses left knee pain and left ankle pain. She has no chest pain, no ferrous breath. She denies any fevers at home. She denies any dysuria, however endorses lower abdominal pain that has been there for a few days. She denies any nausea or vomiting. She has no diarrhea. In the emergency room, plain films without evidence of hip fracture, CT of the hip showed extremely limited examination without any definite left hip fracture dislocation. Patient unable to walk and there any weight in the emergency room, triad was called for admission.  Review of Systems: The patient denies anorexia, fever, weight loss,, vision loss, decreased hearing, hoarseness, chest pain, syncope, dyspnea on exertion, peripheral edema, balance deficits, hemoptysis, abdominal pain, melena, hematochezia, severe indigestion/heartburn, hematuria, incontinence, genital sores, muscle weakness, suspicious skin lesions, transient blindness, difficulty walking, depression, unusual weight change, abnormal bleeding, enlarged lymph nodes, angioedema, and breast masses.   Past Medical History  Diagnosis Date  . Anxiety   . Depression   . Hypertension   . GERD (gastroesophageal reflux disease)   . Hyperlipidemia   . Myocardial infarction   . Stroke     times 2  . Diabetes mellitus without complication     Past Surgical History  Procedure Laterality Date  . Abdominal hysterectomy    . Cholecystectomy    . Oophorectomy    . Colostomy    . Tonsillectomy    . Cataract extraction w/phaco Right 03/20/2013    Procedure: CATARACT EXTRACTION PHACO AND INTRAOCULAR LENS PLACEMENT (IOC);  Surgeon: Elta Guadeloupe T. Gershon Crane, MD;  Location: [facility];  Service: Ophthalmology;  Laterality: Right;  CDE:  6.29    Social History:  reports that she quit smoking about 41 years ago. Her smoking use included Cigarettes. She smoked 0.00 packs per day. Her smokeless tobacco use includes Snuff. She reports that she does not drink alcohol or use illicit drugs.  Allergies  Allergen Reactions  . Aspirin Nausea And Vomiting  . Penicillins Nausea And Vomiting  . Propoxyphene N-Acetaminophen Nausea And Vomiting    Family History  Problem Relation Age of Onset  . Cancer Other   . Diabetes Other   . Abnormal EKG Other   . Arthritis Other   . Asthma Other   . Kidney disease Other    Prior to Admission medications   Medication Sig Start Date End Date Taking? Authorizing Provider  albuterol (PROVENTIL HFA;VENTOLIN HFA) 108 (90 BASE) MCG/ACT inhaler Inhale 2 puffs into the lungs every 6 (six) hours as needed for wheezing or shortness of breath.    Yes Historical Provider, MD  ALPRAZolam Duanne Moron) 0.5 MG tablet Take 0.5 mg by mouth at bedtime as needed for sleep.    Yes Historical Provider, MD  clopidogrel (PLAVIX) 75 MG tablet Take 1 tablet (75 mg total) by mouth daily. 09/26/12  Yes Renella Cunas, MD  escitalopram (LEXAPRO) 20 MG tablet Take  20 mg by mouth daily.   Yes Historical Provider, MD  ezetimibe (ZETIA) 10 MG tablet Take 10 mg by mouth daily.   Yes Historical Provider, MD  furosemide (LASIX) 20 MG tablet Take 20 mg by mouth daily.   Yes Historical Provider, MD  HYDROcodone-acetaminophen (NORCO) 10-325 MG per tablet Take 1 tablet by mouth every 6 (six) hours as needed for moderate pain.    Yes Historical Provider, MD    lisinopril (PRINIVIL,ZESTRIL) 2.5 MG tablet Take 2.5 mg by mouth daily.   Yes Historical Provider, MD  metoprolol (LOPRESSOR) 50 MG tablet Take 50 mg by mouth daily.   Yes Historical Provider, MD  pantoprazole (PROTONIX) 40 MG tablet Take 40 mg by mouth daily.   Yes Historical Provider, MD  zolpidem (AMBIEN) 10 MG tablet Take 10 mg by mouth at bedtime.    Yes Historical Provider, MD  NITROSTAT 0.4 MG SL tablet Place 0.4 mg under the tongue every 5 (five) minutes as needed for chest pain.  07/28/12   Historical Provider, MD   Physical Exam: Filed Vitals:   03/10/14 1300 03/10/14 1330 03/10/14 1633 03/10/14 1955  BP: 123/66 116/66 126/56 91/51  Pulse: 62 65 64 65  Temp:   98 F (36.7 C)   TempSrc:   Oral   Resp:   18 18  Height:      Weight:      SpO2: 93% 93% 96% 97%     General:  No apparent distress  Eyes: PERRL, EOMI, no scleral icterus  ENT: moist oropharynx  Neck: supple, no JVD  Cardiovascular: regular rate without MRG; 2+ peripheral pulses  Respiratory: CTA biL, good air movement without wheezing, rhonchi or crackled  Abdomen: soft, non tender to palpation, positive bowel sounds, no guarding, no rebound; colostomy bag present  Skin: no rashes  Musculoskeletal: no peripheral edema; left hip tenderness to palpation, tenderness with movement of the left leg  Psychiatric: normal mood and affect  Neurologic: CN 2-12 grossly intact, MS 5/5 in all 4  Labs on Admission:  Basic Metabolic Panel:  Recent Labs Lab 03/10/14 1717  NA 141  K 4.4  CL 104  CO2 25  GLUCOSE 98  BUN 16  CREATININE 1.53*  CALCIUM 10.5   CBC:  Recent Labs Lab 03/10/14 1717  WBC 8.5  NEUTROABS 5.2  HGB 13.3  HCT 39.9  MCV 87.9  PLT 226   Cardiac Enzymes: No results found for this basename: CKTOTAL, CKMB, CKMBINDEX, TROPONINI,  in the last 168 hours  BNP (last 3 results) No results found for this basename: PROBNP,  in the last 8760 hours CBG: No results found for this  basename: GLUCAP,  in the last 168 hours  Radiological Exams on Admission: Dg Cervical Spine Complete  03/10/2014   CLINICAL DATA:  Bilateral neck pain following a fall.  EXAM: CERVICAL SPINE  4+ VIEWS  COMPARISON:  None.  FINDINGS: Cervicothoracic and upper thoracic spine degenerative changes. No prevertebral soft tissue swelling, fractures or subluxations. Diffuse osteopenia.  IMPRESSION: No fracture or subluxation.   Electronically Signed   By: Enrique Sack M.D.   On: 03/10/2014 15:00   Dg Hip Complete Left  03/10/2014   CLINICAL DATA:  Left hip pain following a fall. Chronic right hip pain.  EXAM: LEFT HIP - COMPLETE 2+ VIEW  COMPARISON:  None.  FINDINGS: There is no evidence of hip fracture or dislocation. There is no evidence of arthropathy or other focal bone abnormality.  IMPRESSION: No fracture  or dislocation.   Electronically Signed   By: Enrique Sack M.D.   On: 03/10/2014 15:02   Dg Ankle Complete Right  03/10/2014   CLINICAL DATA:  [place].  Injured ankle.  EXAM: RIGHT ANKLE - COMPLETE 3+ VIEW  COMPARISON:  None.  FINDINGS: The ankle mortise is maintained. No acute ankle fracture or osteochondral lesion. The mid and hindfoot bony structures are intact. Calcaneal spurring changes are noted. Arterial calcifications are noted.  IMPRESSION: No acute fracture.   Electronically Signed   By: Kalman Jewels M.D.   On: 03/10/2014 14:53   Ct Hip Left Wo Contrast  03/10/2014   CLINICAL DATA:  Left hip pain, fall  EXAM: CT OF THE LEFT HIP WITHOUT CONTRAST  TECHNIQUE: Multidetector CT imaging was performed according to the standard protocol. Multiplanar CT image reconstructions were also generated.  COMPARISON:  DG HIP COMPLETE*L* dated 03/10/2014; CT ABDOMEN W/CM dated 12/17/2006  FINDINGS: Extremely limited examination secondary to poor signal to noise.  There is no fracture or dislocation. The joint space is maintained. There is no lytic or sclerotic osseous lesion. The visualized left superior and inferior pubic  rami are intact. There is no soft tissue mass, fluid collection or hematoma.  IMPRESSION: Extremely limited examination secondary to poor signal to noise. No definite left hip fracture or dislocation. If there is persistent concern, recommend an MRI of the left hip if the patient is a candidate.   Electronically Signed   By: Kathreen Devoid   On: 03/10/2014 18:01   Dg Foot Complete Left  03/10/2014   CLINICAL DATA:  Left foot pain.  EXAM: LEFT FOOT - COMPLETE 3+ VIEW  COMPARISON:  None.  FINDINGS: The bones are osteopenic. There is an age indeterminate fracture involving the head of the fifth proximal phalanx No dislocations. No radiopaque foreign bodies are soft tissue calcifications.  IMPRESSION: 1. Osteopenia. 2. Age indeterminate fracture involves the head of fifth proximal phalanx   Electronically Signed   By: Kerby Moors M.D.   On: 03/10/2014 14:50    EKG: Independently reviewed.  Assessment/Plan Principal Problem:   Contusion of left hip Active Problems:   CAD, NATIVE VESSEL   Anxiety   Depression   Hypertension   Hyperlipidemia   Hip pain   Hip pain - due to a mechanical fall without any syncope. CT scan without hip fracture. PT consult in the morning, if patient still unable to bear any weight on the left side, I think an MRI could be considered.  Mild lower abdominal pain - we'll obtain a urinalysis  Coronary artery disease - continue home medications  Hypertension - continue home medications  Hyperlipidemia - continue home medications  Depression/anxiety - continue home medications  Chronic kidney disease stage III - patient appears close to baseline. Mild hydration overnight. Repeat BMP in the morning  Reported diet-controlled diabetes - check hemoglobin A1c  Diet: Heart healthy Fluids: Normal saline at 50 cc per hour DVT Prophylaxis: Heparin  Code Status: Full code presumed  Family Communication: None  Disposition Plan: Observation  Time spent: 50  This note  has been created with Surveyor, quantity. Any transcriptional errors are unintentional.   [name] [name] M. Cruzita Lederer, MD [facility] Pager [phone]  If 7PM-7AM, please contact night-coverage [email or web address] Password [facility] 03/10/2014, 8:26 PM

## 2014-03-10 NOTE — Discharge Instructions (Signed)
All x-rays were good with the exception of a small fracture on your baby toe on the left foot. Buddy tape your small to to the toe next to it.   Tylenol or ibuprofen for pain.

## 2014-03-11 ENCOUNTER — Encounter (HOSPITAL_COMMUNITY): Payer: Self-pay | Admitting: *Deleted

## 2014-03-11 ENCOUNTER — Observation Stay (HOSPITAL_COMMUNITY): Payer: PRIVATE HEALTH INSURANCE

## 2014-03-11 DIAGNOSIS — S93409A Sprain of unspecified ligament of unspecified ankle, initial encounter: Secondary | ICD-10-CM

## 2014-03-11 DIAGNOSIS — W19XXXA Unspecified fall, initial encounter: Secondary | ICD-10-CM

## 2014-03-11 DIAGNOSIS — M25559 Pain in unspecified hip: Secondary | ICD-10-CM

## 2014-03-11 DIAGNOSIS — N179 Acute kidney failure, unspecified: Secondary | ICD-10-CM | POA: Diagnosis present

## 2014-03-11 DIAGNOSIS — N189 Chronic kidney disease, unspecified: Secondary | ICD-10-CM

## 2014-03-11 LAB — URINALYSIS, ROUTINE W REFLEX MICROSCOPIC
BILIRUBIN URINE: NEGATIVE
Glucose, UA: NEGATIVE mg/dL
HGB URINE DIPSTICK: NEGATIVE
Ketones, ur: NEGATIVE mg/dL
Nitrite: NEGATIVE
PROTEIN: NEGATIVE mg/dL
Specific Gravity, Urine: >1.03 — ABNORMAL HIGH (ref 1.005–1.030)
UROBILINOGEN UA: 0.2 mg/dL (ref 0.0–1.0)
pH: 5.5 (ref 5.0–8.0)

## 2014-03-11 LAB — COMPREHENSIVE METABOLIC PANEL
ALBUMIN: 3.2 g/dL — AB (ref 3.5–5.2)
ALK PHOS: 59 U/L (ref 39–117)
ALT: 12 U/L (ref 0–35)
AST: 15 U/L (ref 0–37)
BUN: 17 mg/dL (ref 6–23)
CALCIUM: 9.8 mg/dL (ref 8.4–10.5)
CO2: 28 mEq/L (ref 19–32)
Chloride: 107 mEq/L (ref 96–112)
Creatinine, Ser: 1.77 mg/dL — ABNORMAL HIGH (ref 0.50–1.10)
GFR calc Af Amer: 32 mL/min — ABNORMAL LOW (ref >90)
GFR calc non Af Amer: 27 mL/min — ABNORMAL LOW (ref >90)
GLUCOSE: 93 mg/dL (ref 70–99)
POTASSIUM: 4.1 meq/L (ref 3.7–5.3)
SODIUM: 143 meq/L (ref 137–147)
Total Bilirubin: 0.3 mg/dL (ref 0.3–1.2)
Total Protein: 6.2 g/dL (ref 6.0–8.3)

## 2014-03-11 LAB — URINE MICROSCOPIC-ADD ON

## 2014-03-11 LAB — CBC
HCT: 38 % (ref 36.0–46.0)
Hemoglobin: 12.2 g/dL (ref 12.0–15.0)
MCH: 29.1 pg (ref 26.0–34.0)
MCHC: 32.1 g/dL (ref 30.0–36.0)
MCV: 90.7 fL (ref 78.0–100.0)
Platelets: 214 10*3/uL (ref 150–400)
RBC: 4.19 MIL/uL (ref 3.87–5.11)
RDW: 13.1 % (ref 11.5–15.5)
WBC: 7 10*3/uL (ref 4.0–10.5)

## 2014-03-11 LAB — HEMOGLOBIN A1C
HEMOGLOBIN A1C: 5.8 % — AB (ref <5.7)
MEAN PLASMA GLUCOSE: 120 mg/dL — AB (ref <117)

## 2014-03-11 MED ORDER — ACETAMINOPHEN 325 MG PO TABS
650.0000 mg | ORAL_TABLET | Freq: Four times a day (QID) | ORAL | Status: DC | PRN
Start: 2014-03-11 — End: 2014-03-12

## 2014-03-11 MED ORDER — METOPROLOL TARTRATE 50 MG PO TABS
50.0000 mg | ORAL_TABLET | Freq: Every day | ORAL | Status: DC
  Administered 2014-03-12: 50 mg via ORAL
  Filled 2014-03-11: qty 1

## 2014-03-11 MED ORDER — ENSURE COMPLETE PO LIQD
237.0000 mL | Freq: Two times a day (BID) | ORAL | Status: DC
  Administered 2014-03-11 – 2014-03-12 (×2): 237 mL via ORAL

## 2014-03-11 NOTE — Progress Notes (Signed)
03/11/14 1123 Patient to have MRI of left hip this morning. Patient refused to have MRI completed when transporter arrived. Discussed rationale for MRI as ordered to follow-up from CT of left hip. Patient stated "I can't have an MRI and be closed in. If you don't believe me call my doctor". Offered to check with MD regarding medication to help her relax for MRI to be completed. Patient stated "No, I can't do the MRI". Pt agreed to have x-ray of left shoulder done as portable in room. Notified Dyanne Carrel, NP. Donavan Foil, RN

## 2014-03-11 NOTE — Progress Notes (Signed)
[facility] PROGRESS NOTE  [name] [name] HBZ:[id number] DOB: [birth year] DOA: 03/10/2014 PCP: Glo Herring., MD   Summary: 73 year old woman past medical hx CAD, CVA, DM, HTN, anxiety admitted after fall due to hip pain.   Assessment/Plan: Hip pain - due to a mechanical fall without any syncope. CT scan without hip fracture but poor quality. Continues with pain and non-weight bearing. Patient refusing MRI. General xray without dislocation/fx. PT recommended snf. Spoke to orthopedics who recommended if pt non-weight bearing discharge to snf and repeat xray in 10 days.   Left shoulder pain; related to fall. Await xray.   Mild lower abdominal pain - resolved this am. Urinalysis trace leukocytes, many squamous and many bacteria. She is afebrile and non-toxic appearing.   Coronary artery disease - No chest pain. Continue lisinopril lopressor, and lasix.   Hypertension - BP somewhat soft. Will hold lisinopril and lasix for now due to above.  Monitor  Hyperlipidemia - continue home medications   Depression/anxiety - stable at baseline continue home medications    Acute on Chronic kidney disease stage III - creatinine trending above baseline. Provide gently hydration overnight and hold nephrotoxins. Repeat BMP in the morning   Reported diet-controlled diabetes - Hemoglobin A1c pening. CBG 93   Code Status: full Family Communication: none present Disposition Plan: snf   Consultants:  none  Procedures:  none  Antibiotics:  none  HPI/Subjective: Reports continued pain in left hip and left shoulder.   Objective: Filed Vitals:   03/11/14 1140  BP: 94/53  Pulse:   Temp:   Resp:     Intake/Output Summary (Last 24 hours) at 03/11/14 1222 Last data filed at 03/11/14 0500  Gross per 24 hour  Intake      0 ml  Output     50 ml  Net    -50 ml   Filed Weights   03/10/14 1137 03/10/14 2145  Weight: 83.915 kg (185 lb) 84.5 kg (186 lb 4.6 oz)     Exam:   General:  Obese NAD  Cardiovascular: RRR No MGR no LE edema  Respiratory: normal effort BS somewhat diminished but clear no wheeze  Abdomen: obese soft +BS non-tender to palpation  Musculoskeletal: left hip some swelling and tender to palpation. Decreased rom due to pain. Left shoulder with small amount swelling and bruising. Decreased rom due to pain.    Data Reviewed: Basic Metabolic Panel:  Recent Labs Lab 03/10/14 1717 03/11/14 0424  NA 141 143  K 4.4 4.1  CL 104 107  CO2 25 28  GLUCOSE 98 93  BUN 16 17  CREATININE 1.53* 1.77*  CALCIUM 10.5 9.8   Liver Function Tests:  Recent Labs Lab 03/11/14 0424  AST 15  ALT 12  ALKPHOS 59  BILITOT 0.3  PROT 6.2  ALBUMIN 3.2*   No results found for this basename: LIPASE, AMYLASE,  in the last 168 hours No results found for this basename: AMMONIA,  in the last 168 hours CBC:  Recent Labs Lab 03/10/14 1717 03/11/14 0424  WBC 8.5 7.0  NEUTROABS 5.2  --   HGB 13.3 12.2  HCT 39.9 38.0  MCV 87.9 90.7  PLT 226 214   Cardiac Enzymes: No results found for this basename: CKTOTAL, CKMB, CKMBINDEX, TROPONINI,  in the last 168 hours BNP (last 3 results) No results found for this basename: PROBNP,  in the last 8760 hours CBG: No results found for this basename: GLUCAP,  in the last 168 hours  No results found for this or any previous visit (from the past 240 hour(s)).   Studies: Dg Cervical Spine Complete  03/10/2014   CLINICAL DATA:  Bilateral neck pain following a fall.  EXAM: CERVICAL SPINE  4+ VIEWS  COMPARISON:  None.  FINDINGS: Cervicothoracic and upper thoracic spine degenerative changes. No prevertebral soft tissue swelling, fractures or subluxations. Diffuse osteopenia.  IMPRESSION: No fracture or subluxation.   Electronically Signed   By: Enrique Sack M.D.   On: 03/10/2014 15:00   Dg Hip Complete Left  03/10/2014   CLINICAL DATA:  Left hip pain following a fall. Chronic right hip pain.  EXAM: LEFT  HIP - COMPLETE 2+ VIEW  COMPARISON:  None.  FINDINGS: There is no evidence of hip fracture or dislocation. There is no evidence of arthropathy or other focal bone abnormality.  IMPRESSION: No fracture or dislocation.   Electronically Signed   By: Enrique Sack M.D.   On: 03/10/2014 15:02   Dg Ankle Complete Right  03/10/2014   CLINICAL DATA:  [place].  Injured ankle.  EXAM: RIGHT ANKLE - COMPLETE 3+ VIEW  COMPARISON:  None.  FINDINGS: The ankle mortise is maintained. No acute ankle fracture or osteochondral lesion. The mid and hindfoot bony structures are intact. Calcaneal spurring changes are noted. Arterial calcifications are noted.  IMPRESSION: No acute fracture.   Electronically Signed   By: Kalman Jewels M.D.   On: 03/10/2014 14:53   Ct Hip Left Wo Contrast  03/10/2014   CLINICAL DATA:  Left hip pain, fall  EXAM: CT OF THE LEFT HIP WITHOUT CONTRAST  TECHNIQUE: Multidetector CT imaging was performed according to the standard protocol. Multiplanar CT image reconstructions were also generated.  COMPARISON:  DG HIP COMPLETE*L* dated 03/10/2014; CT ABDOMEN W/CM dated 12/17/2006  FINDINGS: Extremely limited examination secondary to poor signal to noise.  There is no fracture or dislocation. The joint space is maintained. There is no lytic or sclerotic osseous lesion. The visualized left superior and inferior pubic rami are intact. There is no soft tissue mass, fluid collection or hematoma.  IMPRESSION: Extremely limited examination secondary to poor signal to noise. No definite left hip fracture or dislocation. If there is persistent concern, recommend an MRI of the left hip if the patient is a candidate.   Electronically Signed   By: Kathreen Devoid   On: 03/10/2014 18:01   Dg Foot Complete Left  03/10/2014   CLINICAL DATA:  Left foot pain.  EXAM: LEFT FOOT - COMPLETE 3+ VIEW  COMPARISON:  None.  FINDINGS: The bones are osteopenic. There is an age indeterminate fracture involving the head of the fifth proximal phalanx  No dislocations. No radiopaque foreign bodies are soft tissue calcifications.  IMPRESSION: 1. Osteopenia. 2. Age indeterminate fracture involves the head of fifth proximal phalanx   Electronically Signed   By: Kerby Moors M.D.   On: 03/10/2014 14:50    Scheduled Meds: . clopidogrel  75 mg Oral QHS  . escitalopram  20 mg Oral Daily  . ezetimibe  10 mg Oral Daily  . furosemide  20 mg Oral Daily  . heparin  5,000 Units Subcutaneous 3 times per day  . lisinopril  2.5 mg Oral Daily  . [START ON 03/12/2014] metoprolol  50 mg Oral Daily  . pantoprazole  40 mg Oral QHS   Continuous Infusions:   Principal Problem:   Contusion of left hip Active Problems:   CAD, NATIVE VESSEL   Anxiety   Depression  Hypertension   Hyperlipidemia   Hip pain    Time spent: 35 minutes    [facility] Pager [phone]. If 7PM-7AM, please contact night-coverage at [email or web address], password [facility] 03/11/2014, 12:22 PM  LOS: 1 day

## 2014-03-11 NOTE — Progress Notes (Signed)
Patient seen, independently examined and chart reviewed. I agree with exam, assessment and plan discussed with [name] Carrel, NP.  73 year old woman presented with left-sided hip and leg pain, right ankle pain after a mechanical fall. Initial imaging of the left hip was unrevealing although limited. Patient is unable to bear weight and so admitted.  Subjective: She continues to complain of left hip and leg pain extending down to her feet. She also has right ankle pain. She is tolerating a diet.  Objective: Afebrile, vital signs are stable. Minimal hypoxia. Appears calm and comfortable. Speech fluent and clear. CV RRR no m/r/g. No LE edema. Respiratory clear to auscultation bilaterally. No wheezes, rales or rhonchi. Normal respiratory effort. Left hip, thigh, knee, lower leg, foot appear grossly unremarkable, nontender to palpation. She does have some swelling of her toes which are mildly tender. The right ankle is unremarkable and nontender. There is no swelling of the right foot. Dorsiflexion, plantar flexion intact without apparent consistent pain. Strength is somewhat diminished left greater than right secondary to pain but appears neurologically intact.  Complete metabolic panel notable for elevated creatinine, baseline perhaps 1.5. CBC unremarkable. Imaging of the left hip, right ankle, left foot, shoulder and cervical spine on revealing. Except for age indeterminate fracture of the fifth proximal phalanx left foot  She continues to complain of left leg pain. History is difficult and she does not localize her symptoms, rather describing the entire leg down to the toes is been painful. Imaging inconclusive. Plan pain control, reevaluate in the morning, continue physical therapy. She cannot tolerate MRI. Wean oxygen  [name] Hodgkins, MD [facility] [phone]

## 2014-03-11 NOTE — Progress Notes (Signed)
UR Completed.  [name] [name] [id number] 03/11/2014

## 2014-03-11 NOTE — Care Management Note (Signed)
    Page 1 of 1   03/12/2014     1:37:01 PM CARE MANAGEMENT NOTE 03/12/2014  Patient:  [name] [name], [name] [name]   Account Number:  [id number]  Date Initiated:  03/11/2014  Documentation initiated by:  Claretha Cooper  Subjective/Objective Assessment:   Pt lives alone. Per pt, she will not go to a facility but agrees to [facility] RN and PT. She has an aide qd for 6 hrs. Not on chronic O2     Action/Plan:   Anticipated DC Date:     Anticipated DC Plan:  [place]  CM consult      Choice offered to / List presented to:          [facility] arranged  HH-1 RN  [place].   Status of service:  Completed, signed off Medicare Important Message given?  NA - LOS <3 / Initial given by admissions (If response is "NO", the following Medicare IM given date fields will be blank) Date Medicare IM given:   Date Additional Medicare IM given:    Discharge Disposition:  [place]  Per UR Regulation:    If discussed at Long Length of Stay Meetings, dates discussed:    Comments:  03/12/14 Claretha Cooper RN BSN CM [facility] notified of DC and [facility] orders.  03/11/14 Claretha Cooper RN BSN CM

## 2014-03-11 NOTE — Evaluation (Signed)
Physical Therapy Evaluation Patient Details Name: [name] [name] MRN: [id number] DOB: 1941/08/23 Today's Date: 03/11/2014   History of Present Illness  CHANDI NICKLIN is a 73 y.o. female has a past medical history significant for coronary artery disease, previous CVAs, diabetes, hyperlipidemia, hypertension presents to the emergency room complaining of fall that occurred this morning. Patient denies any chest pain, lightheadedness or dizziness, and she tripped and fell on her left hip. Since then, she endorses severe pain, and she is unable to bear any weight on the left side. She endorses left knee pain and left ankle pain.  Clinical Impression  Patient displays significant weakness and impaired balance placing patient at increased risk of fall. Recommending patient discharge to a skilled nursing facility to increased strength balance and activity tolerance so patient can safely return home where she has limited support.     Follow Up Recommendations SNF    Equipment Recommendations  None recommended by PT    Recommendations for Other Services       Precautions / Restrictions Precautions Precautions: Fall Restrictions Weight Bearing Restrictions: No      Mobility  Bed Mobility Overal bed mobility: Modified Independent                Transfers Overall transfer level: Needs assistance Equipment used: Rolling walker (2 wheeled) Transfers: Sit to/from Stand Sit to Stand: Min assist            Ambulation/Gait Ambulation/Gait assistance: Min assist Ambulation Distance (Feet): 30 Feet Assistive device: Rolling walker (2 wheeled) Gait Pattern/deviations: WFL(Within Functional Limits)   Gait velocity interpretation: Below normal speed for age/gender General Gait Details: assitance needed due to 1 loss of balance.   Stairs            Wheelchair Mobility    Modified Rankin (Stroke Patients Only)       Balance Overall balance assessment: Needs  assistance Sitting-balance support: Feet supported Sitting balance-Leahy Scale: Good     Standing balance support: Bilateral upper extremity supported Standing balance-Leahy Scale: Poor Standing balance comment: one loss of balance , patient was able to catcvh self with therapist assitance.                              Pertinent Vitals/Pain No pain    Home Living Family/patient expects to be discharged to:: Private residence Living Arrangements: Alone     Home Access: Stairs to enter Entrance Stairs-Rails: None Entrance Stairs-Number of Steps: 1 Home Layout: One level Home Equipment: [place] - single point;Walker - 2 wheels      Prior Function Level of Independence: Independent with assistive device(s)         Comments: "helper" came by during the week to do house chores"      Hand Dominance        Extremity/Trunk Assessment               Lower Extremity Assessment: Overall WFL for tasks assessed;Generalized weakness         Communication   Communication: No difficulties  Cognition Arousal/Alertness: Awake/alert Behavior During Therapy: WFL for tasks assessed/performed Overall Cognitive Status: Within Functional Limits for tasks assessed                      General Comments      Exercises        Assessment/Plan    PT Assessment Patient needs continued PT services  PT Diagnosis Difficulty walking;Generalized weakness   PT Problem List Decreased strength;Decreased range of motion;Decreased activity tolerance;Decreased balance;Decreased mobility  PT Treatment Interventions Gait training;Stair training;Therapeutic activities;Therapeutic exercise;Balance training;Patient/family education   PT Goals (Current goals can be found in the Care Plan section) Acute Rehab PT Goals Patient Stated Goal: to be abl;e to walk 192ft to get from car to a chair inside home. PT Goal Formulation: With patient Time For Goal Achievement:  03/18/14 Potential to Achieve Goals: Good    Frequency Min 3X/week   Barriers to discharge        Co-evaluation               End of Session Equipment Utilized During Treatment: Gait belt Activity Tolerance: Patient limited by fatigue Patient left: in bed;with nursing/sitter in room;with call bell/phone within reach Nurse Communication: Mobility status    Functional Assessment Tool Used: clinical judgement Functional Limitation: Mobility: Walking and moving around Mobility: Walking and Moving Around Current Status (B7169): At least 40 percent but less than 60 percent impaired, limited or restricted Mobility: Walking and Moving Around Goal Status [id number]): At least 20 percent but less than 40 percent impaired, limited or restricted    Time: 1030-1100 PT Time Calculation (min): 30 min   Charges:   PT Evaluation $Initial PT Evaluation Tier I: 1 Procedure PT Treatments $Gait Training: 8-22 mins   PT G Codes:   Functional Assessment Tool Used: clinical judgement Functional Limitation: Mobility: Walking and moving around    [employer] 03/11/2014, 12:18 PM

## 2014-03-11 NOTE — Progress Notes (Signed)
Patient ambulated to the bathroom and back to bed with walker and 2 assist. Gait belt was used. Will ambulate again in the morning.

## 2014-03-11 NOTE — Progress Notes (Signed)
Bladder scan done - 23 ml in bladder and pt has no complaints of distension or pain. Will cont to monitor

## 2014-03-11 NOTE — Progress Notes (Signed)
INITIAL NUTRITION ASSESSMENT  DOCUMENTATION CODES Per approved criteria  -Obesity Unspecified   INTERVENTION: Ensure Complete po BID, each supplement provides 350 kcal and 13 grams of protein   NUTRITION DIAGNOSIS: Inadequate oral intake related to decreased appetite as evidenced by pt diet hx and unplanned weight loss..   Goal: Pt to meet >/= 90% of their estimated nutrition needs    Monitor:  Po intake, labs and wt trends   Reason for Assessment: Malnutrition Screen Score =  3  73 y.o. female  Admitting Dx: Contusion of left hip  ASSESSMENT: Pt has possible hip fx. She enforces decreased appetite over past 3 weeks. Her usual meal pattern is breakfast then "grazes" the rest of day. She has experienced weight loss over past year of 23#, 11% which is not clinically significant.  She drinks chocolate nutrition shakes at home and is agreeable to continue those daily.   Nutrition Focused Physical Exam:  Subcutaneous Fat:  Orbital Region: WDL Upper Arm Region: WDL Thoracic and Lumbar Region: WDL  Muscle:  Temple Region: WDL Clavicle Bone Region: WDL Clavicle and Acromion Bone Region: WDL Scapular Bone Region: not assessed Dorsal Hand: WDL Patellar Region: WDL Anterior Thigh Region: WDL Posterior Calf Region: WDL  Edema: none    Height: Ht Readings from Last 1 Encounters:  03/10/14 5\' 2"  (1.575 m)    Weight: Wt Readings from Last 1 Encounters:  03/10/14 186 lb 4.6 oz (84.5 kg)    Ideal Body Weight: 110# (50 kg)  % Ideal Body Weight: 169%  Wt Readings from Last 10 Encounters:  03/10/14 186 lb 4.6 oz (84.5 kg)  03/20/13 209 lb (94.802 kg)  03/20/13 209 lb (94.802 kg)  11/27/12 209 lb 1.9 oz (94.856 kg)  10/09/12 211 lb (95.709 kg)  08/18/12 217 lb 3.2 oz (98.521 kg)  05/01/12 207 lb (93.895 kg)  08/04/11 213 lb (96.616 kg)  04/29/11 211 lb (95.709 kg)  02/20/10 206 lb (93.441 kg)    Usual Body Weight: 210#  % Usual Body Weight: 89%  BMI:  Body  mass index is 34.06 kg/(m^2).obesity class I  Estimated Nutritional Needs: Kcal: 1600-1800  Protein: 75-85 gr Fluid: 1.6-1.8 liters daily  Skin: intact  Diet Order: Cardiac  EDUCATION NEEDS: -Education needs addressed   Intake/Output Summary (Last 24 hours) at 03/11/14 1457 Last data filed at 03/11/14 0500  Gross per 24 hour  Intake      0 ml  Output     50 ml  Net    -50 ml    Last BM:   Labs:   Recent Labs Lab 03/10/14 1717 03/11/14 0424  NA 141 143  K 4.4 4.1  CL 104 107  CO2 25 28  BUN 16 17  CREATININE 1.53* 1.77*  CALCIUM 10.5 9.8  GLUCOSE 98 93    CBG (last 3)  No results found for this basename: GLUCAP,  in the last 72 hours  Scheduled Meds: . clopidogrel  75 mg Oral QHS  . escitalopram  20 mg Oral Daily  . ezetimibe  10 mg Oral Daily  . heparin  5,000 Units Subcutaneous 3 times per day  . [START ON 03/12/2014] metoprolol  50 mg Oral Daily  . pantoprazole  40 mg Oral QHS    Continuous Infusions:   Past Medical History  Diagnosis Date  . Anxiety   . Depression   . Hypertension   . GERD (gastroesophageal reflux disease)   . Hyperlipidemia   . Myocardial infarction   .  Stroke     times 2  . Diabetes mellitus without complication     Past Surgical History  Procedure Laterality Date  . Abdominal hysterectomy    . Cholecystectomy    . Oophorectomy    . Colostomy    . Tonsillectomy    . Cataract extraction w/phaco Right 03/20/2013    Procedure: CATARACT EXTRACTION PHACO AND INTRAOCULAR LENS PLACEMENT (IOC);  Surgeon: Elta Guadeloupe T. Gershon Crane, MD;  Location: [facility];  Service: Ophthalmology;  Laterality: Right;  CDE:  6.29     Colman Cater MS,RD,CSG,LDN Office: [phone] Pager: [phone]

## 2014-03-12 DIAGNOSIS — S7000XA Contusion of unspecified hip, initial encounter: Principal | ICD-10-CM

## 2014-03-12 LAB — CBC
HEMATOCRIT: 35.9 % — AB (ref 36.0–46.0)
HEMOGLOBIN: 11.7 g/dL — AB (ref 12.0–15.0)
MCH: 29.3 pg (ref 26.0–34.0)
MCHC: 32.6 g/dL (ref 30.0–36.0)
MCV: 90 fL (ref 78.0–100.0)
Platelets: 205 10*3/uL (ref 150–400)
RBC: 3.99 MIL/uL (ref 3.87–5.11)
RDW: 12.9 % (ref 11.5–15.5)
WBC: 6.7 10*3/uL (ref 4.0–10.5)

## 2014-03-12 LAB — BASIC METABOLIC PANEL
BUN: 15 mg/dL (ref 6–23)
CO2: 25 mEq/L (ref 19–32)
Calcium: 9.8 mg/dL (ref 8.4–10.5)
Chloride: 107 mEq/L (ref 96–112)
Creatinine, Ser: 1.67 mg/dL — ABNORMAL HIGH (ref 0.50–1.10)
GFR calc Af Amer: 34 mL/min — ABNORMAL LOW (ref >90)
GFR, EST NON AFRICAN AMERICAN: 29 mL/min — AB (ref >90)
GLUCOSE: 104 mg/dL — AB (ref 70–99)
Potassium: 4.2 mEq/L (ref 3.7–5.3)
SODIUM: 141 meq/L (ref 137–147)

## 2014-03-12 NOTE — Progress Notes (Signed)
Physical Therapy Treatment Patient Details Name: PARLEE AMESCUA MRN: [id number] DOB: 07/18/1941 Today's Date: 03/12/2014    History of Present Illness NAARAH BORGERDING is a 73 y.o. female has a past medical history significant for coronary artery disease, previous CVAs, diabetes, hyperlipidemia, hypertension presents to the emergency room complaining of fall that occurred this morning. Patient denies any chest pain, lightheadedness or dizziness, and she tripped and fell on her left hip. Since then, she endorses severe pain, and she is unable to bear any weight on the left side. She endorses left knee pain and left ankle pain.    PT Comments    Pt is currently refusing SNF placement. Pt demonstrated decreased activity tolerance and decreased balance with gait. Pt currently lives alone and has limited support from a CNA for a few hours during the week. I continue to recommend SNF placement; however, pt is refusing. Therefore, I  would recommend home with HHPT.  Pt is at a high risk for falling if d/c home alone. Pt acknowledges my recommendation, but politely disagrees. Pt is scheduled for d/c home today.  Follow Up Recommendations  SNF     Equipment Recommendations  None recommended by PT    Recommendations for Other Services       Precautions / Restrictions Precautions Precautions: Fall Restrictions Weight Bearing Restrictions: No    Mobility  Bed Mobility Overal bed mobility: Modified Independent             General bed mobility comments: increased time and definite need for a hospital bed to maintain independence. Pt reported she has a hospital bed at home.  Transfers Overall transfer level: Needs assistance Equipment used: Rolling walker (2 wheeled) Transfers: Sit to/from Stand Sit to Stand: Min guard;Min assist         General transfer comment: min assist from lower surfaces, min guard from elevated surfaces.  Ambulation/Gait Ambulation/Gait assistance: Min  guard Ambulation Distance (Feet): 60 Feet Assistive device: Rolling walker (2 wheeled) Gait Pattern/deviations: Step-through pattern;Decreased stride length   Gait velocity interpretation: Below normal speed for age/gender General Gait Details: no lose of balance on the unit with the RW. Pt is very slow and c/o her feet hurting when she walks. Pt demonstrates some balance impairment and limitations are evident during her gait.    Stairs            Wheelchair Mobility    Modified Rankin (Stroke Patients Only)       Balance Overall balance assessment: Needs assistance   Sitting balance-Leahy Scale: Good     Standing balance support: No upper extremity supported Standing balance-Leahy Scale: Poor                      Cognition Arousal/Alertness: Awake/alert Behavior During Therapy: WFL for tasks assessed/performed Overall Cognitive Status: Within Functional Limits for tasks assessed                      Exercises      General Comments        Pertinent Vitals/Pain     Home Living                      Prior Function            PT Goals (current goals can now be found in the care plan section) Progress towards PT goals: Progressing toward goals    Frequency  PT Plan Other (comment) (pt is refusing snf placement)    Co-evaluation             End of Session Equipment Utilized During Treatment: Gait belt Activity Tolerance: Patient limited by fatigue Patient left: in bed;with call bell/phone within reach     Time: 1245-1311 PT Time Calculation (min): 26 min  Charges:  $Gait Training: 23-37 mins                    G Codes:      Colon Flattery Consuelo Thayne 03/12/2014, 1:12 PM

## 2014-03-12 NOTE — Progress Notes (Signed)
Noted new order for re-evaluation due to pending d/c home. Pt was just evaluated yesterday 03/11/14. I have reviewed the therapist notes and do agree with his recommendations. He reported the patient has decreased balance and requires physical assistance to prevent a fall. Per his recommendations he felt SNF would be the best option. If this is not possible I would recommend 24 hour Supervision. Pt lives alone and would need someone to provide physical assist with mobility. Pt is currently on our caseload as an acute patient and will be followed 3/wk. Do not feel a reevaluation is needed.

## 2014-03-12 NOTE — Progress Notes (Signed)
Pt ambulated 100 feet in the hall with NT.  Did not require assistance other than the walker.  Pt did not complain of pain and did not appear to have any difficulty.  Pt returned to bed with call light in reach and bed alarm activated

## 2014-03-12 NOTE — Discharge Summary (Signed)
Physician Discharge Summary  [name] [name]:[id number] DOB: 01-21-1941 DOA: 03/10/2014  PCP: Glo Herring., MD  Admit date: 03/10/2014 Discharge date: 03/12/2014  Time spent: 40 minutes  Recommendations for Outpatient Follow-up:  1. Follow up with Dr. Gerarda Fraction 03/26/14. Will need xray of left hip. At discharge patient ambulating with walker and minimal assist and less pain. Recommend monitoring BP.  Recommend BMET to track kidney function 2. Discharged to home with [facility] PT. SNF recommended. Patient refuses. 3. Patient also has daily help at home from 8am-4pm.  Discharge Diagnoses:  Principal Problem:   Contusion of left hip Active Problems:   CAD, NATIVE VESSEL   Anxiety   Depression   Hypertension   Hyperlipidemia   Hip pain   Acute on chronic renal failure   Discharge Condition: stable  Diet recommendation: heart healthy  Filed Weights   03/10/14 1137 03/10/14 2145  Weight: 83.915 kg (185 lb) 84.5 kg (186 lb 4.6 oz)    History of present illness:  [name] [name] is a 73 y.o. female has a past medical history significant for coronary artery disease, previous CVAs, diabetes, hyperlipidemia, hypertension presented to the emergency room on 03/10/14 complaining of fall that occurred that morning. Patient denied any chest pain, lightheadedness or dizziness, and she tripped and fell on her left hip. Since then, she endorsed severe pain, and she was unable to bear any weight on the left side. She endorsed left knee pain and left ankle pain. She had no chest pain, no ferrous breath. She denied any fevers at home. She denied any dysuria, however endorsed lower abdominal pain that had been there for a few days. She denied any nausea or vomiting. She had no diarrhea. In the emergency room, plain films without evidence of hip fracture, CT of the hip showed extremely limited examination without any definite left hip fracture dislocation. Patient unable to walk and there any weight in the  emergency room, triad was called for admission.  Hospital Course:  *Hip pain - due to a mechanical fall without any syncope. CT scan without hip fracture but poor quality.  Patient refusing MRI that was recommended when she continued to have pain on 03/11/14. General xray without dislocation/fx. PT recommended snf or 24/hr supervision. Spoke to orthopedics who recommended if pt non-weight bearing discharge to snf and repeat xray in 10 days. At discharge pain improved and patient able to ambulate with stand by assist and walker. Will discharge with home health PT. Follow up with PCP 03/26/14 and recommend repeat xray left hip to ensure no fracture.  Left shoulder pain; related to fall. Xray with dislocation of fx. Improved rom at discharge   Mild lower abdominal pain - resolved quickly. Urinalysis trace leukocytes, many squamous and many bacteria. She remained afebrile and non-toxic appearing.   Coronary artery disease - No chest pain. Continue lisinopril lopressor, and lasix.   Hypertension - BP somewhat soft initially. Lisinopril and lasix held initially. Resumed at discharge. SBP 113. Metoprolol daily as well. Recommend close follow up with PCP for trending of BP.   Hyperlipidemia - continue home medications  Depression/anxiety -  Remained stable at baseline. continue home medications   Acute on Chronic kidney disease stage III - creatinine trended above baseline initially. At discharge trending down toward baseline. Recommend BMET on 5/19 to track kidney function.   Reported diet-controlled diabetes - Hemoglobin A1c pening. CBG 93   Procedures:  none  Consultations:  none  Discharge Exam: Filed Vitals:   03/11/14  2216  BP: 113/48  Pulse: 86  Temp: 99 F (37.2 C)  Resp: 18    General: well nourished appears comfortable Cardiovascular: RRR No MGR No LE edema Respiratory: normal effort BS clear bilaterally no wheeze Musculoskeletal: left hip mild bruising, less edema and less  tender to touch. Left shoulder with moderate bruising and tenderness. Decreased rom from pain  Discharge Instructions You were cared for by a hospitalist during your hospital stay. If you have any questions about your discharge medications or the care you received while you were in the hospital after you are discharged, you can call the unit and asked to speak with the hospitalist on call if the hospitalist that took care of you is not available. Once you are discharged, your primary care physician will handle any further medical issues. Please note that NO REFILLS for any discharge medications will be authorized once you are discharged, as it is imperative that you return to your primary care physician (or establish a relationship with a primary care physician if you do not have one) for your aftercare needs so that they can reassess your need for medications and monitor your lab values.       Future Appointments Provider Department Dept Phone   03/13/2014 11:00 AM Arnoldo Lenis, MD [facility] Linna Hoff [phone]       Medication List         albuterol 108 (90 BASE) MCG/ACT inhaler  Commonly known as:  PROVENTIL HFA;VENTOLIN HFA  Inhale 2 puffs into the lungs every 6 (six) hours as needed for wheezing or shortness of breath.     ALPRAZolam 0.5 MG tablet  Commonly known as:  XANAX  Take 0.5 mg by mouth at bedtime as needed for sleep.     clopidogrel 75 MG tablet  Commonly known as:  PLAVIX  Take 1 tablet (75 mg total) by mouth daily.     escitalopram 20 MG tablet  Commonly known as:  LEXAPRO  Take 20 mg by mouth daily.     ezetimibe 10 MG tablet  Commonly known as:  ZETIA  Take 10 mg by mouth daily.     furosemide 20 MG tablet  Commonly known as:  LASIX  Take 20 mg by mouth daily.     HYDROcodone-acetaminophen 10-325 MG per tablet  Commonly known as:  NORCO  Take 1 tablet by mouth every 6 (six) hours as needed for moderate pain.     lisinopril 2.5 MG tablet   Commonly known as:  PRINIVIL,ZESTRIL  Take 2.5 mg by mouth daily.     metoprolol 50 MG tablet  Commonly known as:  LOPRESSOR  Take 50 mg by mouth daily.     NITROSTAT 0.4 MG SL tablet  Generic drug:  nitroGLYCERIN  Place 0.4 mg under the tongue every 5 (five) minutes as needed for chest pain.     pantoprazole 40 MG tablet  Commonly known as:  PROTONIX  Take 40 mg by mouth daily.     zolpidem 10 MG tablet  Commonly known as:  AMBIEN  Take 10 mg by mouth at bedtime.       Allergies  Allergen Reactions  . Aspirin Nausea And Vomiting  . Penicillins Nausea And Vomiting  . Propoxyphene N-Acetaminophen Nausea And Vomiting   Follow-up Information   Follow up with Glo Herring., MD On 03/26/2014. (will need xray of hip on left. has appointment at 11am)    Specialty:  Internal Medicine   Contact information:  [address] [place]  [address] [phone]        The results of significant diagnostics from this hospitalization (including imaging, microbiology, ancillary and laboratory) are listed below for reference.    Significant Diagnostic Studies: Dg Cervical Spine Complete  03/10/2014   CLINICAL DATA:  Bilateral neck pain following a fall.  EXAM: CERVICAL SPINE  4+ VIEWS  COMPARISON:  None.  FINDINGS: Cervicothoracic and upper thoracic spine degenerative changes. No prevertebral soft tissue swelling, fractures or subluxations. Diffuse osteopenia.  IMPRESSION: No fracture or subluxation.   Electronically Signed   By: Enrique Sack M.D.   On: 03/10/2014 15:00   Dg Hip Complete Left  03/10/2014   CLINICAL DATA:  Left hip pain following a fall. Chronic right hip pain.  EXAM: LEFT HIP - COMPLETE 2+ VIEW  COMPARISON:  None.  FINDINGS: There is no evidence of hip fracture or dislocation. There is no evidence of arthropathy or other focal bone abnormality.  IMPRESSION: No fracture or dislocation.   Electronically Signed   By: Enrique Sack M.D.   On: 03/10/2014  15:02   Dg Ankle Complete Right  03/10/2014   CLINICAL DATA:  [place].  Injured ankle.  EXAM: RIGHT ANKLE - COMPLETE 3+ VIEW  COMPARISON:  None.  FINDINGS: The ankle mortise is maintained. No acute ankle fracture or osteochondral lesion. The mid and hindfoot bony structures are intact. Calcaneal spurring changes are noted. Arterial calcifications are noted.  IMPRESSION: No acute fracture.   Electronically Signed   By: Kalman Jewels M.D.   On: 03/10/2014 14:53   Ct Hip Left Wo Contrast  03/10/2014   CLINICAL DATA:  Left hip pain, fall  EXAM: CT OF THE LEFT HIP WITHOUT CONTRAST  TECHNIQUE: Multidetector CT imaging was performed according to the standard protocol. Multiplanar CT image reconstructions were also generated.  COMPARISON:  DG HIP COMPLETE*L* dated 03/10/2014; CT ABDOMEN W/CM dated 12/17/2006  FINDINGS: Extremely limited examination secondary to poor signal to noise.  There is no fracture or dislocation. The joint space is maintained. There is no lytic or sclerotic osseous lesion. The visualized left superior and inferior pubic rami are intact. There is no soft tissue mass, fluid collection or hematoma.  IMPRESSION: Extremely limited examination secondary to poor signal to noise. No definite left hip fracture or dislocation. If there is persistent concern, recommend an MRI of the left hip if the patient is a candidate.   Electronically Signed   By: Kathreen Devoid   On: 03/10/2014 18:01   Dg Shoulder Left  03/11/2014   CLINICAL DATA:  Fall, pain, decreased range of motion  EXAM: LEFT SHOULDER - 2+ VIEW  COMPARISON:  09/11/2007  FINDINGS: Three views of left shoulder submitted. No acute fracture or subluxation. Mild degenerative changes left AC joint. Mild narrowing of glenohumeral joint space.  IMPRESSION: No acute fracture or subluxation.  Mild degenerative changes.   Electronically Signed   By: Lahoma Crocker M.D.   On: 03/11/2014 16:24   Dg Foot Complete Left  03/10/2014   CLINICAL DATA:  Left foot pain.   EXAM: LEFT FOOT - COMPLETE 3+ VIEW  COMPARISON:  None.  FINDINGS: The bones are osteopenic. There is an age indeterminate fracture involving the head of the fifth proximal phalanx No dislocations. No radiopaque foreign bodies are soft tissue calcifications.  IMPRESSION: 1. Osteopenia. 2. Age indeterminate fracture involves the head of fifth proximal phalanx   Electronically Signed   By: Queen Slough.D.  On: 03/10/2014 14:50    Microbiology: No results found for this or any previous visit (from the past 240 hour(s)).   Labs: Basic Metabolic Panel:  Recent Labs Lab 03/10/14 1717 03/11/14 0424 03/12/14 0602  NA 141 143 141  K 4.4 4.1 4.2  CL 104 107 107  CO2 25 28 25   GLUCOSE 98 93 104*  BUN 16 17 15   CREATININE 1.53* 1.77* 1.67*  CALCIUM 10.5 9.8 9.8   Liver Function Tests:  Recent Labs Lab 03/11/14 0424  AST 15  ALT 12  ALKPHOS 59  BILITOT 0.3  PROT 6.2  ALBUMIN 3.2*   No results found for this basename: LIPASE, AMYLASE,  in the last 168 hours No results found for this basename: AMMONIA,  in the last 168 hours CBC:  Recent Labs Lab 03/10/14 1717 03/11/14 0424 03/12/14 0602  WBC 8.5 7.0 6.7  NEUTROABS 5.2  --   --   HGB 13.3 12.2 11.7*  HCT 39.9 38.0 35.9*  MCV 87.9 90.7 90.0  PLT 226 214 205   Cardiac Enzymes: No results found for this basename: CKTOTAL, CKMB, CKMBINDEX, TROPONINI,  in the last 168 hours BNP: BNP (last 3 results) No results found for this basename: PROBNP,  in the last 8760 hours CBG: No results found for this basename: GLUCAP,  in the last 168 hours     Signed:  Radene Gunning  [facility] 03/12/2014, 1:04 PM  Attending note: chart reviewed. Patient examined and interviewed. Separately. SNF recommended. Patient refuses. Home health arranged.  F/u xray left hip in 10 days.  Doree Barthel, M.D.

## 2014-03-12 NOTE — Progress Notes (Signed)
Pt discharged home via wheelchair.  Discharge instructions were given and pt verbalized understanding.  Pt IV removed with out complications.

## 2014-03-13 ENCOUNTER — Ambulatory Visit: Payer: PRIVATE HEALTH INSURANCE | Admitting: Cardiology

## 2014-04-15 ENCOUNTER — Encounter: Payer: Self-pay | Admitting: Orthopedic Surgery

## 2014-04-15 ENCOUNTER — Ambulatory Visit: Payer: PRIVATE HEALTH INSURANCE | Admitting: Orthopedic Surgery

## 2014-06-05 ENCOUNTER — Ambulatory Visit (HOSPITAL_COMMUNITY)
Admission: RE | Admit: 2014-06-05 | Discharge: 2014-06-05 | Disposition: A | Discharge status: Home / Self Care | Payer: PRIVATE HEALTH INSURANCE | Source: Ambulatory Visit | Attending: Physician Assistant | Admitting: Physician Assistant

## 2014-06-05 ENCOUNTER — Other Ambulatory Visit (HOSPITAL_COMMUNITY): Payer: Self-pay | Admitting: Physician Assistant

## 2014-06-05 DIAGNOSIS — R059 Cough, unspecified: Secondary | ICD-10-CM | POA: Insufficient documentation

## 2014-06-05 DIAGNOSIS — D4989 Neoplasm of unspecified behavior of other specified sites: Secondary | ICD-10-CM

## 2014-06-05 DIAGNOSIS — N189 Chronic kidney disease, unspecified: Secondary | ICD-10-CM

## 2014-06-05 DIAGNOSIS — R05 Cough: Secondary | ICD-10-CM | POA: Insufficient documentation

## 2014-06-05 DIAGNOSIS — J984 Other disorders of lung: Secondary | ICD-10-CM

## 2014-06-05 DIAGNOSIS — R079 Chest pain, unspecified: Secondary | ICD-10-CM | POA: Diagnosis not present

## 2014-07-31 ENCOUNTER — Encounter: Payer: Self-pay | Admitting: *Deleted

## 2014-10-02 LAB — PULMONARY FUNCTION TEST

## 2014-10-18 ENCOUNTER — Encounter (HOSPITAL_COMMUNITY): Payer: Self-pay | Admitting: Emergency Medicine

## 2014-10-18 ENCOUNTER — Emergency Department (HOSPITAL_COMMUNITY)
Admission: EM | Admit: 2014-10-18 | Discharge: 2014-10-18 | Disposition: A | Discharge status: Home / Self Care | Payer: PRIVATE HEALTH INSURANCE | Attending: Emergency Medicine | Admitting: Emergency Medicine

## 2014-10-18 DIAGNOSIS — E119 Type 2 diabetes mellitus without complications: Secondary | ICD-10-CM | POA: Insufficient documentation

## 2014-10-18 DIAGNOSIS — Z7902 Long term (current) use of antithrombotics/antiplatelets: Secondary | ICD-10-CM | POA: Diagnosis not present

## 2014-10-18 DIAGNOSIS — I252 Old myocardial infarction: Secondary | ICD-10-CM | POA: Insufficient documentation

## 2014-10-18 DIAGNOSIS — I10 Essential (primary) hypertension: Secondary | ICD-10-CM | POA: Insufficient documentation

## 2014-10-18 DIAGNOSIS — K219 Gastro-esophageal reflux disease without esophagitis: Secondary | ICD-10-CM | POA: Diagnosis not present

## 2014-10-18 DIAGNOSIS — Z87891 Personal history of nicotine dependence: Secondary | ICD-10-CM | POA: Diagnosis not present

## 2014-10-18 DIAGNOSIS — B370 Candidal stomatitis: Secondary | ICD-10-CM | POA: Diagnosis not present

## 2014-10-18 DIAGNOSIS — E860 Dehydration: Secondary | ICD-10-CM | POA: Diagnosis not present

## 2014-10-18 DIAGNOSIS — Z880 Allergy status to penicillin: Secondary | ICD-10-CM | POA: Diagnosis not present

## 2014-10-18 DIAGNOSIS — Z79899 Other long term (current) drug therapy: Secondary | ICD-10-CM | POA: Diagnosis not present

## 2014-10-18 DIAGNOSIS — R4182 Altered mental status, unspecified: Secondary | ICD-10-CM | POA: Diagnosis present

## 2014-10-18 DIAGNOSIS — E785 Hyperlipidemia, unspecified: Secondary | ICD-10-CM | POA: Diagnosis not present

## 2014-10-18 DIAGNOSIS — Z8673 Personal history of transient ischemic attack (TIA), and cerebral infarction without residual deficits: Secondary | ICD-10-CM | POA: Insufficient documentation

## 2014-10-18 DIAGNOSIS — F329 Major depressive disorder, single episode, unspecified: Secondary | ICD-10-CM | POA: Diagnosis not present

## 2014-10-18 DIAGNOSIS — F419 Anxiety disorder, unspecified: Secondary | ICD-10-CM | POA: Diagnosis not present

## 2014-10-18 LAB — CBC WITH DIFFERENTIAL/PLATELET
BASOS ABS: 0.1 10*3/uL (ref 0.0–0.1)
BASOS PCT: 1 % (ref 0–1)
EOS ABS: 0.3 10*3/uL (ref 0.0–0.7)
EOS PCT: 4 % (ref 0–5)
HCT: 30.5 % — ABNORMAL LOW (ref 36.0–46.0)
Hemoglobin: 10.1 g/dL — ABNORMAL LOW (ref 12.0–15.0)
Lymphocytes Relative: 22 % (ref 12–46)
Lymphs Abs: 1.7 10*3/uL (ref 0.7–4.0)
MCH: 30.7 pg (ref 26.0–34.0)
MCHC: 33.1 g/dL (ref 30.0–36.0)
MCV: 92.7 fL (ref 78.0–100.0)
Monocytes Absolute: 0.9 10*3/uL (ref 0.1–1.0)
Monocytes Relative: 12 % (ref 3–12)
Neutro Abs: 4.7 10*3/uL (ref 1.7–7.7)
Neutrophils Relative %: 61 % (ref 43–77)
PLATELETS: 237 10*3/uL (ref 150–400)
RBC: 3.29 MIL/uL — ABNORMAL LOW (ref 3.87–5.11)
RDW: 12.9 % (ref 11.5–15.5)
WBC: 7.6 10*3/uL (ref 4.0–10.5)

## 2014-10-18 LAB — COMPREHENSIVE METABOLIC PANEL
ALBUMIN: 3 g/dL — AB (ref 3.5–5.2)
ALT: 11 U/L (ref 0–35)
AST: 9 U/L (ref 0–37)
Alkaline Phosphatase: 62 U/L (ref 39–117)
Anion gap: 13 (ref 5–15)
BUN: 19 mg/dL (ref 6–23)
CALCIUM: 9.5 mg/dL (ref 8.4–10.5)
CO2: 27 mEq/L (ref 19–32)
Chloride: 103 mEq/L (ref 96–112)
Creatinine, Ser: 2.02 mg/dL — ABNORMAL HIGH (ref 0.50–1.10)
GFR calc Af Amer: 27 mL/min — ABNORMAL LOW (ref >90)
GFR calc non Af Amer: 23 mL/min — ABNORMAL LOW (ref >90)
Glucose, Bld: 96 mg/dL (ref 70–99)
Potassium: 3.9 mEq/L (ref 3.7–5.3)
SODIUM: 143 meq/L (ref 137–147)
TOTAL PROTEIN: 6.3 g/dL (ref 6.0–8.3)
Total Bilirubin: 0.2 mg/dL — ABNORMAL LOW (ref 0.3–1.2)

## 2014-10-18 LAB — CBG MONITORING, ED: Glucose-Capillary: 93 mg/dL (ref 70–99)

## 2014-10-18 LAB — LACTIC ACID, PLASMA: LACTIC ACID, VENOUS: 0.8 mmol/L (ref 0.5–2.2)

## 2014-10-18 LAB — TROPONIN I: Troponin I: <0.3 ng/mL (ref <0.30)

## 2014-10-18 MED ORDER — SODIUM CHLORIDE 0.9 % IV BOLUS (SEPSIS)
1000.0000 mL | Freq: Once | INTRAVENOUS | Status: AC
  Administered 2014-10-18: 1000 mL via INTRAVENOUS

## 2014-10-18 MED ORDER — LIDOCAINE VISCOUS 2 % MT SOLN
15.0000 mL | Freq: Once | OROMUCOSAL | Status: AC
  Administered 2014-10-18: 15 mL via OROMUCOSAL
  Filled 2014-10-18: qty 15

## 2014-10-18 MED ORDER — NYSTATIN 100000 UNIT/ML MT SUSP: 500000.0000 [IU] | Freq: Four times a day (QID) | OROMUCOSAL | Status: DC

## 2014-10-18 NOTE — Discharge Instructions (Signed)
Dehydration, Adult °Dehydration is when you lose more fluids from the body than you take in. Vital organs like the kidneys, brain, and heart cannot function without a proper amount of fluids and salt. Any loss of fluids from the body can cause dehydration.  °CAUSES  °· Vomiting. °· Diarrhea. °· Excessive sweating. °· Excessive urine output. °· Fever. °SYMPTOMS  °Mild dehydration °· Thirst. °· Dry lips. °· Slightly dry mouth. °Moderate dehydration °· Very dry mouth. °· Sunken eyes. °· Skin does not bounce back quickly when lightly pinched and released. °· Dark urine and decreased urine production. °· Decreased tear production. °· Headache. °Severe dehydration °· Very dry mouth. °· Extreme thirst. °· Rapid, weak pulse (more than 100 beats per minute at rest). °· Cold hands and feet. °· Not able to sweat in spite of heat and temperature. °· Rapid breathing. °· Blue lips. °· Confusion and lethargy. °· Difficulty being awakened. °· Minimal urine production. °· No tears. °DIAGNOSIS  °Your caregiver will diagnose dehydration based on your symptoms and your exam. Blood and urine tests will help confirm the diagnosis. The diagnostic evaluation should also identify the cause of dehydration. °TREATMENT  °Treatment of mild or moderate dehydration can often be done at home by increasing the amount of fluids that you drink. It is best to drink small amounts of fluid more often. Drinking too much at one time can make vomiting worse. Refer to the home care instructions below. °Severe dehydration needs to be treated at the hospital where you will probably be given intravenous (IV) fluids that contain water and electrolytes. °HOME CARE INSTRUCTIONS  °· Ask your caregiver about specific rehydration instructions. °· Drink enough fluids to keep your urine clear or pale yellow. °· Drink small amounts frequently if you have nausea and vomiting. °· Eat as you normally do. °· Avoid: °¨ Foods or drinks high in sugar. °¨ Carbonated  drinks. °¨ Juice. °¨ Extremely hot or cold fluids. °¨ Drinks with caffeine. °¨ Fatty, greasy foods. °¨ Alcohol. °¨ Tobacco. °¨ Overeating. °¨ Gelatin desserts. °· Wash your hands well to avoid spreading bacteria and viruses. °· Only take over-the-counter or prescription medicines for pain, discomfort, or fever as directed by your caregiver. °· Ask your caregiver if you should continue all prescribed and over-the-counter medicines. °· Keep all follow-up appointments with your caregiver. °SEEK MEDICAL CARE IF: °· You have abdominal pain and it increases or stays in one area (localizes). °· You have a rash, stiff neck, or severe headache. °· You are irritable, sleepy, or difficult to awaken. °· You are weak, dizzy, or extremely thirsty. °SEEK IMMEDIATE MEDICAL CARE IF:  °· You are unable to keep fluids down or you get worse despite treatment. °· You have frequent episodes of vomiting or diarrhea. °· You have blood or green matter (bile) in your vomit. °· You have blood in your stool or your stool looks black and tarry. °· You have not urinated in 6 to 8 hours, or you have only urinated a small amount of very dark urine. °· You have a fever. °· You faint. °MAKE SURE YOU:  °· Understand these instructions. °· Will watch your condition. °· Will get help right away if you are not doing well or get worse. °Document Released: 10/25/2005 Document Revised: 01/17/2012 Document Reviewed: 06/14/2011 °[employer]® Patient Information ©2015 [employer]. This information is not intended to replace advice given to you by your health care provider. Make sure you discuss any questions you have with your health care   provider. Thrush, Adult  Ritta Slot, also called oral candidiasis, is a fungal infection that develops in the mouth and throat and on the tongue. It causes white patches to form on the mouth and tongue. Ritta Slot is most common in older adults, but it can occur at any age.  Many cases of thrush are mild, but this infection can  also be more serious. Ritta Slot can be a recurring problem for people who have chronic illnesses or who take medicines that limit the body's ability to fight infection. Because these people have difficulty fighting infections, the fungus that causes thrush can spread throughout the body. This can cause life-threatening blood or organ infections. CAUSES  Ritta Slot is usually caused by a yeast called Candida albicans. This fungus is normally present in small amounts in the mouth and on other mucous membranes. It usually causes no harm. However, when conditions are present that allow the fungus to grow uncontrolled, it invades surrounding tissues and becomes an infection. Less often, other Candida species can also lead to thrush.  RISK FACTORS Ritta Slot is more likely to develop in the following people:  People with an impaired ability to fight infection (weakened immune system).   Older adults.   People with HIV.   People with diabetes.   People with dry mouth (xerostomia).   Pregnant women.   People with poor dental care, especially those who have false teeth.   People who use antibiotic medicines.  SIGNS AND SYMPTOMS  Ritta Slot can be a mild infection that causes no symptoms. If symptoms develop, they may include:   A burning feeling in the mouth and throat. This can occur at the start of a thrush infection.   White patches that adhere to the mouth and tongue. The tissue around the patches may be red, raw, and painful. If rubbed (during tooth brushing, for example), the patches and the tissue of the mouth may bleed easily.   A bad taste in the mouth or difficulty tasting foods.   Cottony feeling in the mouth.   Pain during eating and swallowing. DIAGNOSIS  Your health care provider can usually diagnose thrush by looking in your mouth and asking you questions about your health.  TREATMENT  Medicines that help prevent the growth of fungi (antifungals) are the standard treatment for  thrush. These medicines are either applied directly to the affected area (topical) or swallowed (oral). The treatment will depend on the severity of the condition.  Mild Thrush Mild cases of thrush may clear up with the use of an antifungal mouth rinse or lozenges. Treatment usually lasts about 14 days.  Moderate to Severe Thrush  More severe thrush infections that have spread to the esophagus are treated with an oral antifungal medicine. A topical antifungal medicine may also be used.   For some severe infections, a treatment period longer than 14 days may be needed.   Oral antifungal medicines are almost never used during pregnancy because the fetus may be harmed. However, if a pregnant woman has a rare, severe thrush infection that has spread to her blood, oral antifungal medicines may be used. In this case, the risk of harm to the mother and fetus from the severe thrush infection may be greater than the risk posed by the use of antifungal medicines.  Persistent or Recurrent Thrush For cases of thrush that do not go away or keep coming back, treatment may involve the following:   Treatment may be needed twice as long as the symptoms last.  Treatment will include both oral and topical antifungal medicines.   People with weakened immune systems can take an antifungal medicine on a continuous basis to prevent thrush infections.  It is important to treat conditions that make you more likely to get thrush, such as diabetes or HIV.  HOME CARE INSTRUCTIONS   Only take over-the-counter or prescription medicine as directed by your health care provider. Talk to your health care provider about an over-the-counter medicine called gentian violet, which kills bacteria and fungi.   Eat plain, unflavored yogurt as directed by your health care provider. Check the label to make sure the yogurt contains live cultures. This yogurt can help healthy bacteria grow in the mouth that can stop the growth of  the fungus that causes thrush.   Try these measures to help reduce the discomfort of thrush:   Drink cold liquids such as water or iced tea.   Try flavored ice treats or frozen juices.   Eat foods that are easy to swallow, such as gelatin, ice cream, or custard.   If the patches in your mouth are painful, try drinking from a straw.   Rinse your mouth several times a day with a warm saltwater rinse. You can make the saltwater mixture with 1 tsp (6 g) of salt in 8 fl oz (0.2 L) of warm water.   If you wear dentures, remove the dentures before going to bed, brush them vigorously, and soak them in a cleaning solution as directed by your health care provider.   Women who are breastfeeding should clean their nipples with an antifungal medicine as directed by their health care provider. Dry the nipples after breastfeeding. Applying lanolin-containing body lotion may help relieve nipple soreness.  SEEK MEDICAL CARE IF:  Your symptoms are getting worse or are not improving within 7 days of starting treatment.   You have symptoms of spreading infection, such as white patches on the skin outside of the mouth.   You are nursing and you have redness, burning, or pain in the nipples that is not relieved with treatment.  MAKE SURE YOU:  Understand these instructions.  Will watch your condition.  Will get help right away if you are not doing well or get worse. Document Released: 07/20/2004 Document Revised: 08/15/2013 Document Reviewed: 05/28/2013 [facility] Patient Information 2015 [place], [place]. This information is not intended to replace advice given to you by your health care provider. Make sure you discuss any questions you have with your health care provider.

## 2014-10-18 NOTE — ED Notes (Addendum)
Per EMS pt family reports mouth surgery x 2 days ago. Family states pt is not eating or drinking but is taking her pain medication on a regular basis. Pt alert and oriented upon EMS arrival to residence and ambulated with assistance to truck. Pt sleepy but alert to voice upon ED arrival. Oriented x 4. Mm dry.

## 2014-10-18 NOTE — ED Provider Notes (Signed)
CSN: [id number]     Arrival date & time 10/18/14  1540 History   First MD Initiated Contact with Patient 10/18/14 1544     Chief Complaint  Patient presents with  . Altered Mental Status      HPI Patient underwent surgery for a lesion of her lower lip on December 4.  Family reports increasing oral pain since the surgery with decreased oral intake over the past several days.  Patient reports ongoing discomfort and pain in her mouth.  Family has been using viscous lidocaine and chlorhexidine solution and applying this with a long Q-tip without significant relief in her symptoms.  No reports of fevers or chills.  Family is concerned about the possibility of dehydration.  No reports of chest pain or abdominal pain.  No reports of cough or congestion or shortness of breath.   Past Medical History  Diagnosis Date  . Anxiety   . Depression   . Hypertension   . GERD (gastroesophageal reflux disease)   . Hyperlipidemia   . Myocardial infarction   . Stroke     times 2  . Diabetes mellitus without complication    Past Surgical History  Procedure Laterality Date  . Abdominal hysterectomy    . Cholecystectomy    . Oophorectomy    . Colostomy    . Tonsillectomy    . Cataract extraction w/phaco Right 03/20/2013    Procedure: CATARACT EXTRACTION PHACO AND INTRAOCULAR LENS PLACEMENT (IOC);  Surgeon: Elta Guadeloupe T. Gershon Crane, MD;  Location: [facility];  Service: Ophthalmology;  Laterality: Right;  CDE:  6.29    Family History  Problem Relation Age of Onset  . Cancer Other   . Diabetes Other   . Abnormal EKG Other   . Arthritis Other   . Asthma Other   . Kidney disease Other    History  Substance Use Topics  . Smoking status: Former Smoker    Types: Cigarettes    Quit date: 08/18/1972  . Smokeless tobacco: Current User    Types: Snuff     Comment: 1-2 cigs daily x 6 months  . Alcohol Use: No   OB History    No data available     Review of Systems  All other systems reviewed and are  negative.     Allergies  Aspirin; Penicillins; and Propoxyphene n-acetaminophen  Home Medications   Prior to Admission medications   Medication Sig Start Date End Date Taking? Authorizing Provider  albuterol (PROVENTIL HFA;VENTOLIN HFA) 108 (90 BASE) MCG/ACT inhaler Inhale 2 puffs into the lungs every 6 (six) hours as needed for wheezing or shortness of breath.     Historical Provider, MD  ALPRAZolam Duanne Moron) 0.5 MG tablet Take 0.5 mg by mouth at bedtime as needed for sleep.     Historical Provider, MD  clopidogrel (PLAVIX) 75 MG tablet Take 1 tablet (75 mg total) by mouth daily. 09/26/12   Renella Cunas, MD  escitalopram (LEXAPRO) 20 MG tablet Take 20 mg by mouth daily.    Historical Provider, MD  ezetimibe (ZETIA) 10 MG tablet Take 10 mg by mouth daily.    Historical Provider, MD  furosemide (LASIX) 20 MG tablet Take 20 mg by mouth daily.    Historical Provider, MD  HYDROcodone-acetaminophen (NORCO) 10-325 MG per tablet Take 1 tablet by mouth every 6 (six) hours as needed for moderate pain.     Historical Provider, MD  lisinopril (PRINIVIL,ZESTRIL) 2.5 MG tablet Take 2.5 mg by mouth daily.  Historical Provider, MD  metoprolol (LOPRESSOR) 50 MG tablet Take 50 mg by mouth daily.    Historical Provider, MD  NITROSTAT 0.4 MG SL tablet Place 0.4 mg under the tongue every 5 (five) minutes as needed for chest pain.  07/28/12   Historical Provider, MD  pantoprazole (PROTONIX) 40 MG tablet Take 40 mg by mouth daily.    Historical Provider, MD  zolpidem (AMBIEN) 10 MG tablet Take 10 mg by mouth at bedtime.     Historical Provider, MD   BP 98/60 mmHg  Pulse 76  Temp(Src) 99.5 F (37.5 C) (Rectal)  Resp 15  Ht 5\' 6"  (1.676 m)  Wt 186 lb (84.369 kg)  BMI 30.04 kg/m2  SpO2 98% Physical Exam  Constitutional: She is oriented to person, place, and time. She appears well-developed and well-nourished. No distress.  HENT:  Head: Normocephalic and atraumatic.  Very dry mouth and cracked lips.   Healing lesion on her mid lower lip without surrounding erythema.  Oral candidiasis noted of the right lower mucosa.  Tolerating secretions.  Oral airway patent.  Small amount of cracking of her.   Eyes: EOM are normal.  Neck: Normal range of motion.  Cardiovascular: Normal rate, regular rhythm and normal heart sounds.   Pulmonary/Chest: Effort normal and breath sounds normal.  Abdominal: Soft. She exhibits no distension. There is no tenderness.  Musculoskeletal: Normal range of motion.  Neurological: She is alert and oriented to person, place, and time.  Skin: Skin is warm and dry.  Psychiatric: She has a normal mood and affect. Judgment normal.  Nursing note and vitals reviewed.   ED Course  Procedures (including critical care time) Labs Review Labs Reviewed  CBC WITH DIFFERENTIAL - Abnormal; Notable for the following:    RBC 3.29 (*)    Hemoglobin 10.1 (*)    HCT 30.5 (*)    All other components within normal limits  COMPREHENSIVE METABOLIC PANEL - Abnormal; Notable for the following:    Creatinine, Ser 2.02 (*)    Albumin 3.0 (*)    Total Bilirubin 0.2 (*)    GFR calc non Af Amer 23 (*)    GFR calc Af Amer 27 (*)    All other components within normal limits  TROPONIN I  LACTIC ACID, PLASMA  CBG MONITORING, ED    Imaging Review No results found.   EKG Interpretation   Date/Time:  Friday October 18 2014 15:46:25 EST Ventricular Rate:  78 PR Interval:    QRS Duration: 87 QT Interval:  381 QTC Calculation: 434 R Axis:   -23 Text Interpretation:  Normal sinus rhythm Left axis deviation Confirmed by  Ashok Cordia  MD, Lennette Bihari ([id number]) on 10/18/2014 4:57:19 PM      MDM   Final diagnoses:  Dehydration  Oral candidiasis   large mouth swabs were used to add moisture to the patient's mouth followed by 2% viscous lidocaine with significant improvement in the patient's pain.  I think the Q-tip that they were using was not able to apply a large enough amount of the solution to  reduce her pain.  Tolerating fluids at this time.  Hydrated with 2 L of fluid as I suspect she was dehydrated.  Patient will follow-up with her ear nose and throat surgeon at [facility].  She understands to return to the ER for new or worsening symptoms.  Medications  sodium chloride 0.9 % bolus 1,000 mL (0 mLs Intravenous Stopped 10/18/14 1734)  lidocaine (XYLOCAINE) 2 % viscous mouth solution  15 mL (15 mLs Mouth/Throat Given 10/18/14 1733)  sodium chloride 0.9 % bolus 1,000 mL (1,000 mLs Intravenous New Bag/Given 10/18/14 1734)       Hoy Morn, MD 10/18/14 [phone]

## 2014-10-20 ENCOUNTER — Emergency Department (HOSPITAL_COMMUNITY): Payer: PRIVATE HEALTH INSURANCE

## 2014-10-20 ENCOUNTER — Encounter (HOSPITAL_COMMUNITY): Payer: Self-pay

## 2014-10-20 ENCOUNTER — Inpatient Hospital Stay (HOSPITAL_COMMUNITY)
Admission: EM | Admit: 2014-10-20 | Discharge: 2014-10-22 | DRG: 072 | Disposition: A | Discharge status: Home / Self Care | Payer: PRIVATE HEALTH INSURANCE | Attending: Family Medicine | Admitting: Family Medicine

## 2014-10-20 DIAGNOSIS — I2510 Atherosclerotic heart disease of native coronary artery without angina pectoris: Secondary | ICD-10-CM | POA: Diagnosis present

## 2014-10-20 DIAGNOSIS — F419 Anxiety disorder, unspecified: Secondary | ICD-10-CM | POA: Diagnosis present

## 2014-10-20 DIAGNOSIS — N189 Chronic kidney disease, unspecified: Secondary | ICD-10-CM | POA: Diagnosis present

## 2014-10-20 DIAGNOSIS — E785 Hyperlipidemia, unspecified: Secondary | ICD-10-CM | POA: Diagnosis present

## 2014-10-20 DIAGNOSIS — N289 Disorder of kidney and ureter, unspecified: Secondary | ICD-10-CM

## 2014-10-20 DIAGNOSIS — N183 Chronic kidney disease, stage 3 (moderate): Secondary | ICD-10-CM | POA: Diagnosis present

## 2014-10-20 DIAGNOSIS — E041 Nontoxic single thyroid nodule: Secondary | ICD-10-CM | POA: Diagnosis present

## 2014-10-20 DIAGNOSIS — I252 Old myocardial infarction: Secondary | ICD-10-CM

## 2014-10-20 DIAGNOSIS — Z886 Allergy status to analgesic agent status: Secondary | ICD-10-CM

## 2014-10-20 DIAGNOSIS — Z8673 Personal history of transient ischemic attack (TIA), and cerebral infarction without residual deficits: Secondary | ICD-10-CM

## 2014-10-20 DIAGNOSIS — K9419 Other complications of enterostomy: Secondary | ICD-10-CM | POA: Diagnosis present

## 2014-10-20 DIAGNOSIS — Z90710 Acquired absence of both cervix and uterus: Secondary | ICD-10-CM

## 2014-10-20 DIAGNOSIS — Z933 Colostomy status: Secondary | ICD-10-CM

## 2014-10-20 DIAGNOSIS — R079 Chest pain, unspecified: Secondary | ICD-10-CM

## 2014-10-20 DIAGNOSIS — I10 Essential (primary) hypertension: Secondary | ICD-10-CM | POA: Diagnosis present

## 2014-10-20 DIAGNOSIS — I129 Hypertensive chronic kidney disease with stage 1 through stage 4 chronic kidney disease, or unspecified chronic kidney disease: Secondary | ICD-10-CM | POA: Diagnosis present

## 2014-10-20 DIAGNOSIS — G9340 Encephalopathy, unspecified: Principal | ICD-10-CM | POA: Diagnosis present

## 2014-10-20 DIAGNOSIS — W19XXXA Unspecified fall, initial encounter: Secondary | ICD-10-CM

## 2014-10-20 DIAGNOSIS — I69392 Facial weakness following cerebral infarction: Secondary | ICD-10-CM

## 2014-10-20 DIAGNOSIS — Y92009 Unspecified place in unspecified non-institutional (private) residence as the place of occurrence of the external cause: Secondary | ICD-10-CM

## 2014-10-20 DIAGNOSIS — E119 Type 2 diabetes mellitus without complications: Secondary | ICD-10-CM | POA: Diagnosis present

## 2014-10-20 DIAGNOSIS — D649 Anemia, unspecified: Secondary | ICD-10-CM

## 2014-10-20 DIAGNOSIS — C069 Malignant neoplasm of mouth, unspecified: Secondary | ICD-10-CM | POA: Diagnosis present

## 2014-10-20 DIAGNOSIS — Z9841 Cataract extraction status, right eye: Secondary | ICD-10-CM

## 2014-10-20 DIAGNOSIS — R4182 Altered mental status, unspecified: Secondary | ICD-10-CM

## 2014-10-20 DIAGNOSIS — K219 Gastro-esophageal reflux disease without esophagitis: Secondary | ICD-10-CM | POA: Diagnosis present

## 2014-10-20 DIAGNOSIS — F17220 Nicotine dependence, chewing tobacco, uncomplicated: Secondary | ICD-10-CM | POA: Diagnosis present

## 2014-10-20 DIAGNOSIS — R410 Disorientation, unspecified: Secondary | ICD-10-CM | POA: Diagnosis not present

## 2014-10-20 DIAGNOSIS — I639 Cerebral infarction, unspecified: Secondary | ICD-10-CM

## 2014-10-20 DIAGNOSIS — Z880 Allergy status to penicillin: Secondary | ICD-10-CM

## 2014-10-20 LAB — CBC WITH DIFFERENTIAL/PLATELET
Basophils Absolute: 0 10*3/uL (ref 0.0–0.1)
Basophils Relative: 0 % (ref 0–1)
EOS PCT: 2 % (ref 0–5)
Eosinophils Absolute: 0.1 10*3/uL (ref 0.0–0.7)
HEMATOCRIT: 30.1 % — AB (ref 36.0–46.0)
HEMOGLOBIN: 10 g/dL — AB (ref 12.0–15.0)
LYMPHS PCT: 23 % (ref 12–46)
Lymphs Abs: 1.3 10*3/uL (ref 0.7–4.0)
MCH: 30.1 pg (ref 26.0–34.0)
MCHC: 33.2 g/dL (ref 30.0–36.0)
MCV: 90.7 fL (ref 78.0–100.0)
MONOS PCT: 14 % — AB (ref 3–12)
Monocytes Absolute: 0.8 10*3/uL (ref 0.1–1.0)
Neutro Abs: 3.5 10*3/uL (ref 1.7–7.7)
Neutrophils Relative %: 61 % (ref 43–77)
PLATELETS: 243 10*3/uL (ref 150–400)
RBC: 3.32 MIL/uL — AB (ref 3.87–5.11)
RDW: 12.8 % (ref 11.5–15.5)
WBC: 5.8 10*3/uL (ref 4.0–10.5)

## 2014-10-20 LAB — COMPREHENSIVE METABOLIC PANEL
ALBUMIN: 3.1 g/dL — AB (ref 3.5–5.2)
ALT: 8 U/L (ref 0–35)
AST: 12 U/L (ref 0–37)
Alkaline Phosphatase: 61 U/L (ref 39–117)
Anion gap: 13 (ref 5–15)
BUN: 14 mg/dL (ref 6–23)
CALCIUM: 9.4 mg/dL (ref 8.4–10.5)
CO2: 25 mEq/L (ref 19–32)
Chloride: 104 mEq/L (ref 96–112)
Creatinine, Ser: 1.84 mg/dL — ABNORMAL HIGH (ref 0.50–1.10)
GFR calc Af Amer: 30 mL/min — ABNORMAL LOW (ref >90)
GFR, EST NON AFRICAN AMERICAN: 26 mL/min — AB (ref >90)
Glucose, Bld: 88 mg/dL (ref 70–99)
Potassium: 3.7 mEq/L (ref 3.7–5.3)
SODIUM: 142 meq/L (ref 137–147)
Total Bilirubin: 0.2 mg/dL — ABNORMAL LOW (ref 0.3–1.2)
Total Protein: 6.5 g/dL (ref 6.0–8.3)

## 2014-10-20 LAB — CK: CK TOTAL: 72 U/L (ref 7–177)

## 2014-10-20 NOTE — ED Provider Notes (Signed)
CSN: [id number]     Arrival date & time 10/20/14  2302 History  This chart was scribed for Delora Fuel, MD by Lowella Petties, ED Scribe. The patient was seen in room [facility]. Patient's care was started at 11:17 PM.   Chief Complaint  Patient presents with  . Altered Mental Status  . Fall   The history is provided by the patient. No language interpreter was used.   HPI Comments: [name] [name] is a 73 y.o. female with a history of MI, stroke, DM, HTN, anxiety and depression who presents to the Emergency Department after a fall earlier tonight when she woke up to go to the bathroom. She states that her legs gave out and she hit the ground. She reports aching pain in her hips bilaterally which is exacerbated by movement of her BLE. Per EMS, her daughter went to check on her after the fall and found her medications "in disarray" and she was calling for animals that were not there. She states that she is having trouble talking due to her recent mouth surgery.   Past Medical History  Diagnosis Date  . Anxiety   . Depression   . Hypertension   . GERD (gastroesophageal reflux disease)   . Hyperlipidemia   . Myocardial infarction   . Stroke     times 2  . Diabetes mellitus without complication    Past Surgical History  Procedure Laterality Date  . Abdominal hysterectomy    . Cholecystectomy    . Oophorectomy    . Colostomy    . Tonsillectomy    . Cataract extraction w/phaco Right 03/20/2013    Procedure: CATARACT EXTRACTION PHACO AND INTRAOCULAR LENS PLACEMENT (IOC);  Surgeon: Elta Guadeloupe T. Gershon Crane, MD;  Location: [facility];  Service: Ophthalmology;  Laterality: Right;  CDE:  6.29    Family History  Problem Relation Age of Onset  . Cancer Other   . Diabetes Other   . Abnormal EKG Other   . Arthritis Other   . Asthma Other   . Kidney disease Other    History  Substance Use Topics  . Smoking status: Former Smoker    Types: Cigarettes    Quit date: 08/18/1972  . Smokeless  tobacco: Current User    Types: Snuff     Comment: 1-2 cigs daily x 6 months  . Alcohol Use: No   OB History    No data available     Review of Systems  Musculoskeletal: Positive for arthralgias (hips).  Psychiatric/Behavioral: Positive for confusion.  All other systems reviewed and are negative.  Allergies  Aspirin; Penicillins; and Propoxyphene n-acetaminophen  Home Medications   Prior to Admission medications   Medication Sig Start Date End Date Taking? Authorizing Provider  albuterol (PROVENTIL HFA;VENTOLIN HFA) 108 (90 BASE) MCG/ACT inhaler Inhale 2 puffs into the lungs every 6 (six) hours as needed for wheezing or shortness of breath.     Historical Provider, MD  ALPRAZolam Duanne Moron) 0.5 MG tablet Take 0.5-1 mg by mouth 4 (four) times daily as needed for anxiety or sleep.     Historical Provider, MD  chlorhexidine (PERIDEX) 0.12 % solution Use as directed 15 mLs in the mouth or throat 2 (two) times daily. 10 day course starting on 10/12/2014    Historical Provider, MD  clopidogrel (PLAVIX) 75 MG tablet Take 1 tablet (75 mg total) by mouth daily. 09/26/12   Renella Cunas, MD  escitalopram (LEXAPRO) 20 MG tablet Take 20 mg by mouth daily.  Historical Provider, MD  ezetimibe (ZETIA) 10 MG tablet Take 10 mg by mouth daily.    Historical Provider, MD  furosemide (LASIX) 20 MG tablet Take 20 mg by mouth daily.    Historical Provider, MD  HYDROcodone-acetaminophen (NORCO) 10-325 MG per tablet Take 1 tablet by mouth 4 (four) times daily as needed for moderate pain.     Historical Provider, MD  lidocaine (XYLOCAINE) 2 % solution Use as directed 15 mLs in the mouth or throat as needed for mouth pain (to be used for up to 10 days starting on 10/12/2014).    Historical Provider, MD  lisinopril (PRINIVIL,ZESTRIL) 2.5 MG tablet Take 2.5 mg by mouth daily.    Historical Provider, MD  metoprolol (LOPRESSOR) 50 MG tablet Take 50 mg by mouth daily.    Historical Provider, MD  NITROSTAT 0.4 MG SL  tablet Place 0.4 mg under the tongue every 5 (five) minutes as needed for chest pain.  07/28/12   Historical Provider, MD  nystatin (MYCOSTATIN) 100000 UNIT/ML suspension Take 5 mLs (500,000 Units total) by mouth 4 (four) times daily. 10/18/14   Hoy Morn, MD  pantoprazole (PROTONIX) 40 MG tablet Take 40 mg by mouth daily.    Historical Provider, MD  zolpidem (AMBIEN) 10 MG tablet Take 10 mg by mouth at bedtime as needed for sleep.     Historical Provider, MD   Triage Vitals: BP 118/65 mmHg  Pulse 74  Temp(Src) 99 F (37.2 C) (Oral)  Resp 16  SpO2 93% Physical Exam  Constitutional: She appears well-developed and well-nourished. No distress.  HENT:  Head: Normocephalic and atraumatic.  Mild swelling of lips and gums from recent mouth surgery.   Eyes: Conjunctivae and EOM are normal. Pupils are equal, round, and reactive to light.  Neck: No JVD present. No tracheal deviation present.  Cardiovascular: Normal rate, regular rhythm and normal heart sounds.   No murmur heard. Pulmonary/Chest: Effort normal and breath sounds normal. No respiratory distress. She has no wheezes. She has no rales.  Abdominal: Soft. Bowel sounds are normal. She exhibits no distension and no mass. There is no tenderness.  Musculoskeletal: Normal range of motion.  Mild tenderness of both hips. Mild pain with passive range of motion both hips. Trace edema.  Lymphadenopathy:    She has no cervical adenopathy.  Neurological: She is alert. She has normal reflexes. No cranial nerve deficit. Coordination normal.  Speech is diarthric secondary to recent mouth surgery. Moderate weakness of both legs, possibly secondary to pain.   Skin: Skin is warm and dry. No rash noted.  Psychiatric: She has a normal mood and affect. Her behavior is normal.  Nursing note and vitals reviewed.   ED Course  Procedures (including critical care time) DIAGNOSTIC STUDIES: Oxygen Saturation is 93% on room air, adequate by my  interpretation.    COORDINATION OF CARE: 11:24 PM-Discussed treatment plan which includes head CT-scan, C-spine Ct-scan, hip X-ray, lab work, and EKG with pt at bedside and pt agreed to plan.   Labs Review Results for orders placed or performed during the hospital encounter of 10/20/14  Comprehensive metabolic panel  Result Value Ref Range   Sodium 142 137 - 147 mEq/L   Potassium 3.7 3.7 - 5.3 mEq/L   Chloride 104 96 - 112 mEq/L   CO2 25 19 - 32 mEq/L   Glucose, Bld 88 70 - 99 mg/dL   BUN 14 6 - 23 mg/dL   Creatinine, Ser 1.84 (H) 0.50 - 1.10 mg/dL  Calcium 9.4 8.4 - 10.5 mg/dL   Total Protein 6.5 6.0 - 8.3 g/dL   Albumin 3.1 (L) 3.5 - 5.2 g/dL   AST 12 0 - 37 U/L   ALT 8 0 - 35 U/L   Alkaline Phosphatase 61 39 - 117 U/L   Total Bilirubin 0.2 (L) 0.3 - 1.2 mg/dL   GFR calc non Af Amer 26 (L) >90 mL/min   GFR calc Af Amer 30 (L) >90 mL/min   Anion gap 13 5 - 15  CBC with Differential  Result Value Ref Range   WBC 5.8 4.0 - 10.5 K/uL   RBC 3.32 (L) 3.87 - 5.11 MIL/uL   Hemoglobin 10.0 (L) 12.0 - 15.0 g/dL   HCT 30.1 (L) 36.0 - 46.0 %   MCV 90.7 78.0 - 100.0 fL   MCH 30.1 26.0 - 34.0 pg   MCHC 33.2 30.0 - 36.0 g/dL   RDW 12.8 11.5 - 15.5 %   Platelets 243 150 - 400 K/uL   Neutrophils Relative % 61 43 - 77 %   Neutro Abs 3.5 1.7 - 7.7 K/uL   Lymphocytes Relative 23 12 - 46 %   Lymphs Abs 1.3 0.7 - 4.0 K/uL   Monocytes Relative 14 (H) 3 - 12 %   Monocytes Absolute 0.8 0.1 - 1.0 K/uL   Eosinophils Relative 2 0 - 5 %   Eosinophils Absolute 0.1 0.0 - 0.7 K/uL   Basophils Relative 0 0 - 1 %   Basophils Absolute 0.0 0.0 - 0.1 K/uL  Urinalysis, Routine w reflex microscopic  Result Value Ref Range   Color, Urine YELLOW YELLOW   APPearance CLEAR CLEAR   Specific Gravity, Urine 1.020 1.005 - 1.030   pH 5.5 5.0 - 8.0   Glucose, UA NEGATIVE NEGATIVE mg/dL   Hgb urine dipstick TRACE (A) NEGATIVE   Bilirubin Urine SMALL (A) NEGATIVE   Ketones, ur TRACE (A) NEGATIVE mg/dL    Protein, ur NEGATIVE NEGATIVE mg/dL   Urobilinogen, UA 0.2 0.0 - 1.0 mg/dL   Nitrite NEGATIVE NEGATIVE   Leukocytes, UA NEGATIVE NEGATIVE  CK  Result Value Ref Range   Total CK 72 7 - 177 U/L  Urine microscopic-add on  Result Value Ref Range   Squamous Epithelial / LPF MANY (A) RARE   WBC, UA 0-2 <3 WBC/hpf   RBC / HPF 0-2 <3 RBC/hpf  Troponin I  Result Value Ref Range   Troponin I <0.30 <0.30 ng/mL   Imaging Review Dg Chest 1 View  10/21/2014   CLINICAL DATA:  Chest pain, unspecified  EXAM: CHEST - 1 VIEW  COMPARISON:  06/05/2014  FINDINGS: Heart and mediastinal contours are distorted by leftward rotation but there is likely cardiomegaly. Low lung volumes without edema or definitive pneumonia. A small linear opacity in the left mid chest favors scarring or atelectasis. No effusion or pneumothorax.  IMPRESSION: 1. No edema or pneumonia. 2. Limited evaluation of the mediastinum due to rotation.   Electronically Signed   By: Jorje Guild M.D.   On: 10/21/2014 03:08   Dg Hip Bilateral W/pelvis  10/21/2014   CLINICAL DATA:  Fall at home, initial encounter. Bilateral hip pain.  EXAM: BILATERAL HIP WITH PELVIS - 4+ VIEW  COMPARISON:  None.  FINDINGS: No fracture or dislocation is noted. Sacroiliac joints appear normal. Both hip joints appear normal.  IMPRESSION: Normal bilateral hips.   Electronically Signed   By: Sabino Dick M.D.   On: 10/21/2014 00:19   Ct  Head Wo Contrast  10/21/2014   CLINICAL DATA:  [place] in bathroom today, woke up in bathroom. Legs gave out.  EXAM: CT HEAD WITHOUT CONTRAST  CT CERVICAL SPINE WITHOUT CONTRAST  TECHNIQUE: Multidetector CT imaging of the head and cervical spine was performed following the standard protocol without intravenous contrast. Multiplanar CT image reconstructions of the cervical spine were also generated.  COMPARISON:  None.  FINDINGS: CT HEAD FINDINGS  The ventricles and sulci are normal for age. No intraparenchymal hemorrhage, mass effect nor  midline shift. Patchy supratentorial white matter hypodensities are within normal range for patient's age and though non-specific suggest sequelae of chronic small vessel ischemic disease. No acute large vascular territory infarcts. No new focal LEFT parietal hypodensity. No abnormal extra-axial fluid collections. Basal cisterns are patent. Moderate calcific atherosclerosis of the carotid siphons.  No skull fracture. The included ocular globes and orbital contents are non-suspicious. Status post apparent ocular lens implants. The mastoid aircells and included paranasal sinuses are well-aerated. Patient is edentulous.  CT CERVICAL SPINE FINDINGS  Cervical vertebral bodies and posterior elements are intact and aligned with straightened cervical lordosis. Intervertebral disc heights generally preserved, mild ventral endplate spurring I0-9 and C6-7. C1-2 articulation maintained with moderate arthropathy. Bone mineral density is decreased without destructive bony lesions. Medial course of the carotid arteries. 15 mm LEFT thyroid nodule.  Severe LEFT C2-3 facet arthropathy with moderate to severe at LEFT C2-3, C3-4 neural foraminal narrowing.  IMPRESSION: CT HEAD: LEFT parietal hypodensity suggests remote traumatic or ischemic process though, is new from prior imaging. If clinical concern for acute process, MRI of the brain would be more sensitive.  Involutional changes. Mild white matter changes suggest chronic small vessel ischemic disease.  CT CERVICAL SPINE:  No acute fracture malalignment.  15 mm LEFT thyroid nodule for which follow up thyroid sonogram is recommended on a nonemergent basis.   Electronically Signed   By: Elon Alas   On: 10/21/2014 00:12   Ct Cervical Spine Wo Contrast  10/21/2014   CLINICAL DATA:  [place] in bathroom today, woke up in bathroom. Legs gave out.  EXAM: CT HEAD WITHOUT CONTRAST  CT CERVICAL SPINE WITHOUT CONTRAST  TECHNIQUE: Multidetector CT imaging of the head and cervical spine  was performed following the standard protocol without intravenous contrast. Multiplanar CT image reconstructions of the cervical spine were also generated.  COMPARISON:  None.  FINDINGS: CT HEAD FINDINGS  The ventricles and sulci are normal for age. No intraparenchymal hemorrhage, mass effect nor midline shift. Patchy supratentorial white matter hypodensities are within normal range for patient's age and though non-specific suggest sequelae of chronic small vessel ischemic disease. No acute large vascular territory infarcts. No new focal LEFT parietal hypodensity. No abnormal extra-axial fluid collections. Basal cisterns are patent. Moderate calcific atherosclerosis of the carotid siphons.  No skull fracture. The included ocular globes and orbital contents are non-suspicious. Status post apparent ocular lens implants. The mastoid aircells and included paranasal sinuses are well-aerated. Patient is edentulous.  CT CERVICAL SPINE FINDINGS  Cervical vertebral bodies and posterior elements are intact and aligned with straightened cervical lordosis. Intervertebral disc heights generally preserved, mild ventral endplate spurring B3-5 and C6-7. C1-2 articulation maintained with moderate arthropathy. Bone mineral density is decreased without destructive bony lesions. Medial course of the carotid arteries. 15 mm LEFT thyroid nodule.  Severe LEFT C2-3 facet arthropathy with moderate to severe at LEFT C2-3, C3-4 neural foraminal narrowing.  IMPRESSION: CT HEAD: LEFT parietal hypodensity suggests remote traumatic or ischemic  process though, is new from prior imaging. If clinical concern for acute process, MRI of the brain would be more sensitive.  Involutional changes. Mild white matter changes suggest chronic small vessel ischemic disease.  CT CERVICAL SPINE:  No acute fracture malalignment.  15 mm LEFT thyroid nodule for which follow up thyroid sonogram is recommended on a nonemergent basis.   Electronically Signed   By:  Elon Alas   On: 10/21/2014 00:12     EKG Interpretation   Date/Time:  Sunday October 20 2014 23:15:21 EST Ventricular Rate:  76 PR Interval:  187 QRS Duration: 84 QT Interval:  388 QTC Calculation: 436 R Axis:   -36 Text Interpretation:  Sinus rhythm Left axis deviation Borderline low  voltage, extremity leads Abnormal R-wave progression, late transition When  compared with ECG of 10/18/2014, No significant change was found Confirmed  by [facility]  MD, Lopaka Karge ([id number]) on 10/20/2014 11:23:15 PM      MDM   Final diagnoses:  Fall at home, initial encounter  Altered mental status, unspecified altered mental status type  Chest pain, unspecified chest pain type  Renal insufficiency  Normochromic normocytic anemia    Fall without apparent injury. Patient has dysarthric speech related to recent oral surgery but no sign of stroke. She is sent for CT scan of head and cervical spine which show an age-indeterminate lucency in the left parietal area which probably is not related to her current situation. Also, pelvis and hip x-rays were negative for fracture.   Family members have arrived and they state that the patient has not been taking her medications normally. She had her medications strewn across a counter and she apparently was taking 8 pills, twice a day. Medications which are supposed to be taken only once a day were being taken twice a day. This includes clopidogrel and zolpidem. She also has a prescription for alprazolam and it's not clear if she had been taking that. Family states that she did not recognize some family members and was hallucinating. I suspect that this is related to accidental overdose of zolpidem. She also has been given hydrocodone-acetaminophen for pain. These are the psychoactive medications which may be related to her mental state change. It is decided to admit her for observation. CNS effects of medication should abate within 24-48 hours. If there is any  question, MRI can be obtained. Case is discussed with Dr. Shanon Brow of [facility] who agrees to admit her under observation status.  I personally performed the services described in this documentation, which was scribed in my presence. The recorded information has been reviewed and is accurate.     Delora Fuel, MD 19/14/78 [id number]

## 2014-10-20 NOTE — ED Notes (Signed)
Medications were out of order yesterday, took medications correctly today. Was calling for animals that were not there. Fell in the bathroom today.

## 2014-10-21 ENCOUNTER — Inpatient Hospital Stay (HOSPITAL_COMMUNITY): Payer: PRIVATE HEALTH INSURANCE

## 2014-10-21 ENCOUNTER — Encounter (HOSPITAL_COMMUNITY): Payer: Self-pay | Admitting: *Deleted

## 2014-10-21 ENCOUNTER — Emergency Department (HOSPITAL_COMMUNITY): Payer: PRIVATE HEALTH INSURANCE

## 2014-10-21 DIAGNOSIS — K9419 Other complications of enterostomy: Secondary | ICD-10-CM | POA: Diagnosis present

## 2014-10-21 DIAGNOSIS — I129 Hypertensive chronic kidney disease with stage 1 through stage 4 chronic kidney disease, or unspecified chronic kidney disease: Secondary | ICD-10-CM | POA: Diagnosis present

## 2014-10-21 DIAGNOSIS — K219 Gastro-esophageal reflux disease without esophagitis: Secondary | ICD-10-CM | POA: Diagnosis present

## 2014-10-21 DIAGNOSIS — Z8673 Personal history of transient ischemic attack (TIA), and cerebral infarction without residual deficits: Secondary | ICD-10-CM

## 2014-10-21 DIAGNOSIS — G9340 Encephalopathy, unspecified: Secondary | ICD-10-CM | POA: Diagnosis present

## 2014-10-21 DIAGNOSIS — N189 Chronic kidney disease, unspecified: Secondary | ICD-10-CM

## 2014-10-21 DIAGNOSIS — F419 Anxiety disorder, unspecified: Secondary | ICD-10-CM | POA: Diagnosis present

## 2014-10-21 DIAGNOSIS — C069 Malignant neoplasm of mouth, unspecified: Secondary | ICD-10-CM | POA: Diagnosis present

## 2014-10-21 DIAGNOSIS — N183 Chronic kidney disease, stage 3 (moderate): Secondary | ICD-10-CM | POA: Diagnosis present

## 2014-10-21 DIAGNOSIS — Z886 Allergy status to analgesic agent status: Secondary | ICD-10-CM | POA: Diagnosis not present

## 2014-10-21 DIAGNOSIS — E785 Hyperlipidemia, unspecified: Secondary | ICD-10-CM | POA: Diagnosis present

## 2014-10-21 DIAGNOSIS — I2510 Atherosclerotic heart disease of native coronary artery without angina pectoris: Secondary | ICD-10-CM | POA: Diagnosis present

## 2014-10-21 DIAGNOSIS — Z933 Colostomy status: Secondary | ICD-10-CM | POA: Diagnosis not present

## 2014-10-21 DIAGNOSIS — Z90710 Acquired absence of both cervix and uterus: Secondary | ICD-10-CM | POA: Diagnosis not present

## 2014-10-21 DIAGNOSIS — W19XXXA Unspecified fall, initial encounter: Secondary | ICD-10-CM

## 2014-10-21 DIAGNOSIS — E119 Type 2 diabetes mellitus without complications: Secondary | ICD-10-CM | POA: Diagnosis present

## 2014-10-21 DIAGNOSIS — Z9841 Cataract extraction status, right eye: Secondary | ICD-10-CM | POA: Diagnosis not present

## 2014-10-21 DIAGNOSIS — Z880 Allergy status to penicillin: Secondary | ICD-10-CM | POA: Diagnosis not present

## 2014-10-21 DIAGNOSIS — Y92009 Unspecified place in unspecified non-institutional (private) residence as the place of occurrence of the external cause: Secondary | ICD-10-CM

## 2014-10-21 DIAGNOSIS — I69392 Facial weakness following cerebral infarction: Secondary | ICD-10-CM | POA: Diagnosis not present

## 2014-10-21 DIAGNOSIS — R410 Disorientation, unspecified: Secondary | ICD-10-CM | POA: Diagnosis present

## 2014-10-21 DIAGNOSIS — E041 Nontoxic single thyroid nodule: Secondary | ICD-10-CM | POA: Diagnosis present

## 2014-10-21 DIAGNOSIS — I252 Old myocardial infarction: Secondary | ICD-10-CM | POA: Diagnosis not present

## 2014-10-21 DIAGNOSIS — Y92099 Unspecified place in other non-institutional residence as the place of occurrence of the external cause: Secondary | ICD-10-CM

## 2014-10-21 DIAGNOSIS — F17220 Nicotine dependence, chewing tobacco, uncomplicated: Secondary | ICD-10-CM | POA: Diagnosis present

## 2014-10-21 LAB — URINE MICROSCOPIC-ADD ON

## 2014-10-21 LAB — BASIC METABOLIC PANEL
ANION GAP: 13 (ref 5–15)
BUN: 13 mg/dL (ref 6–23)
CALCIUM: 9.4 mg/dL (ref 8.4–10.5)
CO2: 24 meq/L (ref 19–32)
CREATININE: 1.72 mg/dL — AB (ref 0.50–1.10)
Chloride: 107 mEq/L (ref 96–112)
GFR calc non Af Amer: 28 mL/min — ABNORMAL LOW (ref >90)
GFR, EST AFRICAN AMERICAN: 33 mL/min — AB (ref >90)
Glucose, Bld: 84 mg/dL (ref 70–99)
Potassium: 3.7 mEq/L (ref 3.7–5.3)
Sodium: 144 mEq/L (ref 137–147)

## 2014-10-21 LAB — URINALYSIS, ROUTINE W REFLEX MICROSCOPIC
GLUCOSE, UA: NEGATIVE mg/dL
LEUKOCYTES UA: NEGATIVE
Nitrite: NEGATIVE
Protein, ur: NEGATIVE mg/dL
Specific Gravity, Urine: 1.02 (ref 1.005–1.030)
Urobilinogen, UA: 0.2 mg/dL (ref 0.0–1.0)
pH: 5.5 (ref 5.0–8.0)

## 2014-10-21 LAB — CBC
HEMATOCRIT: 29.9 % — AB (ref 36.0–46.0)
Hemoglobin: 9.9 g/dL — ABNORMAL LOW (ref 12.0–15.0)
MCH: 30 pg (ref 26.0–34.0)
MCHC: 33.1 g/dL (ref 30.0–36.0)
MCV: 90.6 fL (ref 78.0–100.0)
Platelets: 237 10*3/uL (ref 150–400)
RBC: 3.3 MIL/uL — ABNORMAL LOW (ref 3.87–5.11)
RDW: 12.8 % (ref 11.5–15.5)
WBC: 6.5 10*3/uL (ref 4.0–10.5)

## 2014-10-21 LAB — TROPONIN I: Troponin I: <0.3 ng/mL (ref <0.30)

## 2014-10-21 MED ORDER — HYDROCODONE-ACETAMINOPHEN 5-325 MG PO TABS
1.0000 | ORAL_TABLET | Freq: Four times a day (QID) | ORAL | Status: DC | PRN
  Administered 2014-10-21 – 2014-10-22 (×4): 1 via ORAL
  Filled 2014-10-21 (×4): qty 1

## 2014-10-21 MED ORDER — POTASSIUM CHLORIDE IN NACL 20-0.9 MEQ/L-% IV SOLN
INTRAVENOUS | Status: AC
  Administered 2014-10-21: 06:00:00 via INTRAVENOUS

## 2014-10-21 MED ORDER — PANTOPRAZOLE SODIUM 40 MG PO TBEC
40.0000 mg | DELAYED_RELEASE_TABLET | Freq: Every day | ORAL | Status: DC
  Administered 2014-10-21 – 2014-10-22 (×2): 40 mg via ORAL
  Filled 2014-10-21 (×2): qty 1

## 2014-10-21 MED ORDER — INFLUENZA VAC SPLIT QUAD 0.5 ML IM SUSY
0.5000 mL | PREFILLED_SYRINGE | INTRAMUSCULAR | Status: DC
  Filled 2014-10-21: qty 0.5

## 2014-10-21 MED ORDER — CLOPIDOGREL BISULFATE 75 MG PO TABS
75.0000 mg | ORAL_TABLET | Freq: Every day | ORAL | Status: DC
  Administered 2014-10-21 – 2014-10-22 (×2): 75 mg via ORAL
  Filled 2014-10-21 (×2): qty 1

## 2014-10-21 MED ORDER — CETYLPYRIDINIUM CHLORIDE 0.05 % MT LIQD
7.0000 mL | Freq: Two times a day (BID) | OROMUCOSAL | Status: DC
  Administered 2014-10-21: 7 mL via OROMUCOSAL

## 2014-10-21 MED ORDER — ONDANSETRON HCL 4 MG/2ML IJ SOLN: 4.0000 mg | Freq: Four times a day (QID) | INTRAMUSCULAR | Status: DC | PRN

## 2014-10-21 MED ORDER — PNEUMOCOCCAL VAC POLYVALENT 25 MCG/0.5ML IJ INJ
0.5000 mL | INJECTION | INTRAMUSCULAR | Status: DC
  Filled 2014-10-21: qty 0.5

## 2014-10-21 MED ORDER — ONDANSETRON HCL 4 MG PO TABS: 4.0000 mg | ORAL_TABLET | Freq: Four times a day (QID) | ORAL | Status: DC | PRN

## 2014-10-21 MED ORDER — METOPROLOL TARTRATE 50 MG PO TABS
50.0000 mg | ORAL_TABLET | Freq: Every day | ORAL | Status: DC
  Administered 2014-10-21 – 2014-10-22 (×2): 50 mg via ORAL
  Filled 2014-10-21 (×2): qty 1

## 2014-10-21 NOTE — Progress Notes (Signed)
Subjective: Patient admitted this morning with acute encephalopathy, she has been taking medications more than prescribed. Still drowsy, but able to communicate. Filed Vitals:   10/21/14 1327  BP: 141/66  Pulse: 77  Temp: 98.9 F (37.2 C)  Resp: 18    Chest: Clear Bilaterally Heart : S1S2 RRR Abdomen: Soft, nontender Ext : No edema Neuro: drowsy, oriented x 3  A/P Acute encephalopathy- improving, likely due to polypharmacy. Meds are on hold.   H/o CVA- Stable, CT head shows parietal hypodensity suggests remote traumatic or ischemic process, new from prior imaging. Will obtain MRI brain   [facility] Pager- [phone]

## 2014-10-21 NOTE — Progress Notes (Signed)
UR completed 

## 2014-10-21 NOTE — H&P (Signed)
PCP:   Glo Herring., MD   Chief Complaint:  confusion  HPI: 73 yo female multiple med issues including h/o cva with residual left sided facial droop, recent oral/lip cancer removal by laser at [facility] on 12.4.15 (please see notes in care everywhere section) has been having a lot of pain at site of surgery.  Lives alone.  Her children have noted she has not been eating or drinking well.  She got up tonight to go to the bathroom and fell.  She has been more confused for the past several days.  Her kids went to check on her tonight after she fell, and she was very confused.  They checked her medications and noted that she had been taking her ambien twice a day and some others were incorrect in her pill box but mostly they were concerned about the Lorrin Mais being taken twice a day.  Pt is confused.  Sedated but answers questions appropriately.  Lips are very dry and she has pain opening her mouth.  History is obtained from edp.  Family has left.  Review of Systems:  Unobtainable from pt  Past Medical History: Past Medical History  Diagnosis Date  . Anxiety   . Depression   . Hypertension   . GERD (gastroesophageal reflux disease)   . Hyperlipidemia   . Myocardial infarction   . Stroke     times 2  . Diabetes mellitus without complication    Past Surgical History  Procedure Laterality Date  . Abdominal hysterectomy    . Cholecystectomy    . Oophorectomy    . Colostomy    . Tonsillectomy    . Cataract extraction w/phaco Right 03/20/2013    Procedure: CATARACT EXTRACTION PHACO AND INTRAOCULAR LENS PLACEMENT (IOC);  Surgeon: Elta Guadeloupe T. Gershon Crane, MD;  Location: [facility];  Service: Ophthalmology;  Laterality: Right;  CDE:  6.29     Medications: Prior to Admission medications   Medication Sig Start Date End Date Taking? Authorizing Provider  albuterol (PROVENTIL HFA;VENTOLIN HFA) 108 (90 BASE) MCG/ACT inhaler Inhale 2 puffs into the lungs every 6 (six) hours as needed for wheezing or  shortness of breath.     Historical Provider, MD  ALPRAZolam Duanne Moron) 0.5 MG tablet Take 0.5-1 mg by mouth 4 (four) times daily as needed for anxiety or sleep.     Historical Provider, MD  chlorhexidine (PERIDEX) 0.12 % solution Use as directed 15 mLs in the mouth or throat 2 (two) times daily. 10 day course starting on 10/12/2014    Historical Provider, MD  clopidogrel (PLAVIX) 75 MG tablet Take 1 tablet (75 mg total) by mouth daily. 09/26/12   Renella Cunas, MD  escitalopram (LEXAPRO) 20 MG tablet Take 20 mg by mouth daily.    Historical Provider, MD  ezetimibe (ZETIA) 10 MG tablet Take 10 mg by mouth daily.    Historical Provider, MD  furosemide (LASIX) 20 MG tablet Take 20 mg by mouth daily.    Historical Provider, MD  HYDROcodone-acetaminophen (NORCO) 10-325 MG per tablet Take 1 tablet by mouth 4 (four) times daily as needed for moderate pain.     Historical Provider, MD  lidocaine (XYLOCAINE) 2 % solution Use as directed 15 mLs in the mouth or throat as needed for mouth pain (to be used for up to 10 days starting on 10/12/2014).    Historical Provider, MD  lisinopril (PRINIVIL,ZESTRIL) 2.5 MG tablet Take 2.5 mg by mouth daily.    Historical Provider, MD  metoprolol (LOPRESSOR)  50 MG tablet Take 50 mg by mouth daily.    Historical Provider, MD  NITROSTAT 0.4 MG SL tablet Place 0.4 mg under the tongue every 5 (five) minutes as needed for chest pain.  07/28/12   Historical Provider, MD  nystatin (MYCOSTATIN) 100000 UNIT/ML suspension Take 5 mLs (500,000 Units total) by mouth 4 (four) times daily. 10/18/14   Hoy Morn, MD  pantoprazole (PROTONIX) 40 MG tablet Take 40 mg by mouth daily.    Historical Provider, MD  zolpidem (AMBIEN) 10 MG tablet Take 10 mg by mouth at bedtime as needed for sleep.     Historical Provider, MD    Allergies:   Allergies  Allergen Reactions  . Aspirin Nausea And Vomiting  . Penicillins Nausea And Vomiting  . Propoxyphene N-Acetaminophen Nausea And Vomiting     Social History:  reports that she quit smoking about 42 years ago. Her smoking use included Cigarettes. She smoked 0.00 packs per day. Her smokeless tobacco use includes Snuff. She reports that she does not drink alcohol or use illicit drugs.  Family History: Family History  Problem Relation Age of Onset  . Cancer Other   . Diabetes Other   . Abnormal EKG Other   . Arthritis Other   . Asthma Other   . Kidney disease Other     Physical Exam: Filed Vitals:   10/21/14 0230 10/21/14 0300 10/21/14 0404 10/21/14 0446  BP: 119/57 134/93 125/67 130/62  Pulse: 87 77 78 80  Temp:   98.3 F (36.8 C) 98.3 F (36.8 C)  TempSrc:   Oral Oral  Resp: 15 16 18 16   Height:    5\' 2"  (1.575 m)  Weight:    88 kg (194 lb 0.1 oz)  SpO2: 92% 95% 96% 97%   General appearance: alert, cooperative, no distress and slowed mentation Head: Normocephalic, without obvious abnormality, atraumatic  Mouth very dry, cracked lips, inspection to inside mouth was attempted (she cannot open fully due to pain) but did not see any problems exam was not complete Eyes: negative Nose: Nares normal. Septum midline. Mucosa normal. No drainage or sinus tenderness. Neck: no JVD and supple, symmetrical, trachea midline Lungs: clear to auscultation bilaterally Heart: regular rate and rhythm, S1, S2 normal, no murmur, click, rub or gallop Abdomen: soft, non-tender; bowel sounds normal; no masses,  no organomegaly ostomy c/d/i Extremities: extremities normal, atraumatic, no cyanosis or edema Pulses: 2+ and symmetric Skin: Skin color, texture, turgor normal. No rashes or lesions Neurologic: Grossly normal x left facial droop   Labs on Admission:   Recent Labs  10/18/14 1614 10/20/14 2320  NA 143 142  K 3.9 3.7  CL 103 104  CO2 27 25  GLUCOSE 96 88  BUN 19 14  CREATININE 2.02* 1.84*  CALCIUM 9.5 9.4    Recent Labs  10/18/14 1614 10/20/14 2320  AST 9 12  ALT 11 8  ALKPHOS 62 61  BILITOT 0.2* 0.2*  PROT  6.3 6.5  ALBUMIN 3.0* 3.1*    Recent Labs  10/18/14 1614 10/20/14 2320  WBC 7.6 5.8  NEUTROABS 4.7 3.5  HGB 10.1* 10.0*  HCT 30.5* 30.1*  MCV 92.7 90.7  PLT 237 243    Recent Labs  10/18/14 1614 10/20/14 2320 10/21/14 0219  CKTOTAL  --  72  --   TROPONINI <0.30  --  <0.30   Radiological Exams on Admission: Dg Chest 1 View  10/21/2014   CLINICAL DATA:  Chest pain, unspecified  EXAM:  CHEST - 1 VIEW  COMPARISON:  06/05/2014  FINDINGS: Heart and mediastinal contours are distorted by leftward rotation but there is likely cardiomegaly. Low lung volumes without edema or definitive pneumonia. A small linear opacity in the left mid chest favors scarring or atelectasis. No effusion or pneumothorax.  IMPRESSION: 1. No edema or pneumonia. 2. Limited evaluation of the mediastinum due to rotation.   Electronically Signed   By: Jorje Guild M.D.   On: 10/21/2014 03:08   Dg Hip Bilateral W/pelvis  10/21/2014   CLINICAL DATA:  Fall at home, initial encounter. Bilateral hip pain.  EXAM: BILATERAL HIP WITH PELVIS - 4+ VIEW  COMPARISON:  None.  FINDINGS: No fracture or dislocation is noted. Sacroiliac joints appear normal. Both hip joints appear normal.  IMPRESSION: Normal bilateral hips.   Electronically Signed   By: Sabino Dick M.D.   On: 10/21/2014 00:19   Ct Head Wo Contrast  10/21/2014   CLINICAL DATA:  [place] in bathroom today, woke up in bathroom. Legs gave out.  EXAM: CT HEAD WITHOUT CONTRAST  CT CERVICAL SPINE WITHOUT CONTRAST  TECHNIQUE: Multidetector CT imaging of the head and cervical spine was performed following the standard protocol without intravenous contrast. Multiplanar CT image reconstructions of the cervical spine were also generated.  COMPARISON:  None.  FINDINGS: CT HEAD FINDINGS  The ventricles and sulci are normal for age. No intraparenchymal hemorrhage, mass effect nor midline shift. Patchy supratentorial white matter hypodensities are within normal range for patient's age  and though non-specific suggest sequelae of chronic small vessel ischemic disease. No acute large vascular territory infarcts. No new focal LEFT parietal hypodensity. No abnormal extra-axial fluid collections. Basal cisterns are patent. Moderate calcific atherosclerosis of the carotid siphons.  No skull fracture. The included ocular globes and orbital contents are non-suspicious. Status post apparent ocular lens implants. The mastoid aircells and included paranasal sinuses are well-aerated. Patient is edentulous.  CT CERVICAL SPINE FINDINGS  Cervical vertebral bodies and posterior elements are intact and aligned with straightened cervical lordosis. Intervertebral disc heights generally preserved, mild ventral endplate spurring K5-9 and C6-7. C1-2 articulation maintained with moderate arthropathy. Bone mineral density is decreased without destructive bony lesions. Medial course of the carotid arteries. 15 mm LEFT thyroid nodule.  Severe LEFT C2-3 facet arthropathy with moderate to severe at LEFT C2-3, C3-4 neural foraminal narrowing.  IMPRESSION: CT HEAD: LEFT parietal hypodensity suggests remote traumatic or ischemic process though, is new from prior imaging. If clinical concern for acute process, MRI of the brain would be more sensitive.  Involutional changes. Mild white matter changes suggest chronic small vessel ischemic disease.  CT CERVICAL SPINE:  No acute fracture malalignment.  15 mm LEFT thyroid nodule for which follow up thyroid sonogram is recommended on a nonemergent basis.   Electronically Signed   By: Elon Alas   On: 10/21/2014 00:12   Ct Cervical Spine Wo Contrast  10/21/2014   CLINICAL DATA:  [place] in bathroom today, woke up in bathroom. Legs gave out.  EXAM: CT HEAD WITHOUT CONTRAST  CT CERVICAL SPINE WITHOUT CONTRAST  TECHNIQUE: Multidetector CT imaging of the head and cervical spine was performed following the standard protocol without intravenous contrast. Multiplanar CT image  reconstructions of the cervical spine were also generated.  COMPARISON:  None.  FINDINGS: CT HEAD FINDINGS  The ventricles and sulci are normal for age. No intraparenchymal hemorrhage, mass effect nor midline shift. Patchy supratentorial white matter hypodensities are within normal range for patient's age and  though non-specific suggest sequelae of chronic small vessel ischemic disease. No acute large vascular territory infarcts. No new focal LEFT parietal hypodensity. No abnormal extra-axial fluid collections. Basal cisterns are patent. Moderate calcific atherosclerosis of the carotid siphons.  No skull fracture. The included ocular globes and orbital contents are non-suspicious. Status post apparent ocular lens implants. The mastoid aircells and included paranasal sinuses are well-aerated. Patient is edentulous.  CT CERVICAL SPINE FINDINGS  Cervical vertebral bodies and posterior elements are intact and aligned with straightened cervical lordosis. Intervertebral disc heights generally preserved, mild ventral endplate spurring Z5-6 and C6-7. C1-2 articulation maintained with moderate arthropathy. Bone mineral density is decreased without destructive bony lesions. Medial course of the carotid arteries. 15 mm LEFT thyroid nodule.  Severe LEFT C2-3 facet arthropathy with moderate to severe at LEFT C2-3, C3-4 neural foraminal narrowing.  IMPRESSION: CT HEAD: LEFT parietal hypodensity suggests remote traumatic or ischemic process though, is new from prior imaging. If clinical concern for acute process, MRI of the brain would be more sensitive.  Involutional changes. Mild white matter changes suggest chronic small vessel ischemic disease.  CT CERVICAL SPINE:  No acute fracture malalignment.  15 mm LEFT thyroid nodule for which follow up thyroid sonogram is recommended on a nonemergent basis.   Electronically Signed   By: Elon Alas   On: 10/21/2014 00:12    Assessment/Plan  73 yo female with encephalopathy  suspect due to mild dehydration and misuse of medications at home in setting of recent laser oral surgery for cancer  Principal Problem:   Encephalopathy acute-  Suspect she has been taking her meds wrong.  She says she is not taking much more pain meds than she normally does.  Appears dry, order mouth care.  Hold her home meds mainly her sedatives/pain meds and freq neuro checks overnight.  If mental status does not clear with these actions, consider mri. But has no new focal neuro defecits at this time.  No fever.  Wbc nml so reassuring.  Gentle ivf.  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   Full code.  Amirr Achord A 10/21/2014, 5:11 AM

## 2014-10-21 NOTE — Progress Notes (Signed)
0730 Pt noted ambulating w/o assistance in room. IV catheter removed by pt from RIGHT forearm & lying on the floor draining fluid. Pt noted with no shirt on & had removed cardiac monitor. Bed linens noted in the bathroom floor. Pt assisted & changed into clean gown, cardiac monitor reapplied. Pt assisted back to bed, bed alarm on, call bell within reach. Pt educated to use call bell for assistance.

## 2014-10-22 DIAGNOSIS — N183 Chronic kidney disease, stage 3 (moderate): Secondary | ICD-10-CM

## 2014-10-22 MED ORDER — HYDROCODONE-ACETAMINOPHEN 5-325 MG PO TABS: 1.0000 | ORAL_TABLET | Freq: Four times a day (QID) | ORAL | Status: DC | PRN

## 2014-10-22 NOTE — Progress Notes (Signed)
[name] [name] discharged home per MD order.  Discharge instructions reviewed and discussed with the patient and son at bedside, all questions and concerns answered. Copy of instructions and scripts given to patient.    Medication List    STOP taking these medications        HYDROcodone-acetaminophen 10-325 MG per tablet  Commonly known as:  NORCO  Replaced by:  HYDROcodone-acetaminophen 5-325 MG per tablet      TAKE these medications        albuterol 108 (90 BASE) MCG/ACT inhaler  Commonly known as:  PROVENTIL HFA;VENTOLIN HFA  Inhale 2 puffs into the lungs every 6 (six) hours as needed for wheezing or shortness of breath.     ALPRAZolam 0.5 MG tablet  Commonly known as:  XANAX  Take 0.5-1 mg by mouth 4 (four) times daily as needed for anxiety or sleep.     chlorhexidine 0.12 % solution  Commonly known as:  PERIDEX  Use as directed 15 mLs in the mouth or throat 2 (two) times daily. 10 day course starting on 10/12/2014     clopidogrel 75 MG tablet  Commonly known as:  PLAVIX  Take 1 tablet (75 mg total) by mouth daily.     escitalopram 20 MG tablet  Commonly known as:  LEXAPRO  Take 20 mg by mouth daily.     ezetimibe 10 MG tablet  Commonly known as:  ZETIA  Take 10 mg by mouth daily.     furosemide 20 MG tablet  Commonly known as:  LASIX  Take 20 mg by mouth daily.     HYDROcodone-acetaminophen 5-325 MG per tablet  Commonly known as:  NORCO/VICODIN  Take 1 tablet by mouth every 6 (six) hours as needed for moderate pain.     lidocaine 2 % solution  Commonly known as:  XYLOCAINE  Use as directed 15 mLs in the mouth or throat as needed for mouth pain (to be used for up to 10 days starting on 10/12/2014).     lisinopril 2.5 MG tablet  Commonly known as:  PRINIVIL,ZESTRIL  Take 2.5 mg by mouth daily.     metoprolol 50 MG tablet  Commonly known as:  LOPRESSOR  Take 50 mg by mouth daily.     NITROSTAT 0.4 MG SL tablet  Generic drug:  nitroGLYCERIN  Place 0.4 mg  under the tongue every 5 (five) minutes as needed for chest pain.     nystatin 100000 UNIT/ML suspension  Commonly known as:  MYCOSTATIN  Take 5 mLs (500,000 Units total) by mouth 4 (four) times daily.     pantoprazole 40 MG tablet  Commonly known as:  PROTONIX  Take 40 mg by mouth daily.     zolpidem 10 MG tablet  Commonly known as:  AMBIEN  Take 10 mg by mouth at bedtime as needed for sleep.        IV site discontinued and catheter remains intact. Site without signs and symptoms of complications. Dressing and pressure applied.  Patient escorted to car by Colletta [place],  NT in a wheelchair,  no distress noted upon discharge.  Regino Bellow 10/22/2014 2:32 PM

## 2014-10-22 NOTE — Clinical Documentation Improvement (Signed)
Supporting Information: Patient with a history of CKD per 12/14 progress notes.  Labs: GFR:  12/14: 33 12/13: 30   Possible Diagnosis? . Document the stage of CKD --Chronic kidney disease, stage 1- GFR > OR = 90 --Chronic kidney disease, stage 2 (mild) - GFR 60-89 --Chronic kidney disease, stage 3 (moderate) - GFR 30-59 --Chronic kidney disease, stage 4 (severe) - GFR 15-29 --Chronic kidney disease, stage 5- GFR < 15 --End-stage renal disease (ESRD) . Document any underlying cause of CKD such as Diabetes or Hypertension . Document if the patient is dependent on Dialysis . Chronic renal failure without a documented stage will be assigned to Chronic kidney disease, unspecified . Document any associated diagnoses/conditions    Thank Sherian Maroon Documentation Specialist [phone] [email or web address] [email or web address]

## 2014-10-22 NOTE — Discharge Summary (Addendum)
Physician Discharge Summary  [name] [name] HBZ:[id number] DOB: [birth year] DOA: 10/20/2014  PCP: Glo Herring., MD  Admit date: 10/20/2014 Discharge date: 10/22/2014  Time spent: 50* minutes  Recommendations for Outpatient Follow-up:  *Follow up PCP in 2 weeks 15 mm LEFT thyroid nodule for which follow up thyroid sonogram is 1. recommended on a nonemergent basis.  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 Lives alone. Her children have noted she has not been eating or drinking well. She got up tonight to go to the bathroom and fell. She has been more confused for the past several days. Her kids went to check on her tonight after she fell, and she was very confused. They checked her medications and noted that she had been taking her ambien twice a day and some others were incorrect in her pill box but mostly they were concerned about the Lorrin Mais being taken twice a day. Pt is confused. Sedated but answers questions appropriately  Hospital Course:   Altered mental status- patient came with altered mental status secondary to overmedication and polypharmacy. Patient's mental status improved after the medications were held in the hospital. Chest x-ray was clear,  UA was clear at this time patient is back to baseline. CT head and MRI brain failed to show any significant lesion. At this time patient will be discharged home. Home health nurse was offered to the patient but she declined.: Discussed with the son, who says that somebody would benefit her for next 24-48 hours. Explained to the patient, to take medications as prescribed and not overmedicate her.  I will change the dose of Vicodin to 5/325 one tablet every 4 hours when necessary.   CKD stage III- patient's creatinine is stable at 1.72.  CT cervical spine showed 15 mm LEFT thyroid nodule for which follow up thyroid sonogram is recommended on a nonemergent basis.  Procedures:  None  Consultations:  None  Discharge Exam: Filed Vitals:   10/22/14 0917  BP: 121/60  Pulse: 72  Temp:   Resp:     General: Appear in no acute distress Cardiovascular: S1-S2 regular Respiratory: Clear bilaterally  Discharge Instructions You were cared for by a hospitalist during your hospital stay. If you have any questions about your discharge medications or the care you received while you were in the hospital after you are discharged, you can call the unit and asked to speak with the hospitalist on call if the hospitalist that took care of you is not available. Once you are discharged, your primary care physician will handle any further medical issues. Please note that NO REFILLS for any discharge medications will be authorized once you are discharged, as it is imperative that you return to your  primary care physician (or establish a relationship with a primary care physician if you do not have one) for your aftercare needs so that they can reassess your need for medications and monitor your lab values.  Discharge Instructions    Diet - low sodium heart healthy    Complete by:  As directed      Increase activity slowly    Complete by:  As directed           Current Discharge Medication List     CONTINUE these medications which have NOT CHANGED   Details  albuterol (PROVENTIL HFA;VENTOLIN HFA) 108 (90 BASE) MCG/ACT inhaler Inhale 2 puffs into the lungs every 6 (six) hours as needed for wheezing or shortness of breath.     ALPRAZolam (XANAX) 0.5 MG tablet Take 0.5-1 mg by mouth 4 (four) times daily as needed for anxiety or sleep.     chlorhexidine (PERIDEX) 0.12 % solution Use as directed 15 mLs in the mouth or throat 2 (two) times daily. 10 day course starting on 10/12/2014    clopidogrel (PLAVIX) 75 MG tablet Take 1 tablet (75 mg total) by mouth daily. Qty: 30 tablet, Refills: 6    escitalopram (LEXAPRO) 20 MG tablet Take 20 mg by mouth daily.    ezetimibe (ZETIA) 10 MG tablet Take 10 mg by mouth daily.    furosemide (LASIX) 20 MG tablet Take 20 mg by mouth daily.    HYDROcodone-acetaminophen (NORCO) 10-325 MG per tablet Take 1 tablet by mouth 4 (four) times daily as needed for moderate pain.     lidocaine (XYLOCAINE) 2 % solution Use as directed 15 mLs in the mouth or throat as needed for mouth pain (to be used for up to 10 days starting on 10/12/2014).    lisinopril (PRINIVIL,ZESTRIL) 2.5 MG tablet Take 2.5 mg by mouth daily.    metoprolol (LOPRESSOR) 50 MG tablet Take 50 mg by mouth daily.    NITROSTAT 0.4 MG SL tablet Place 0.4 mg under the tongue every 5 (five) minutes as needed for chest pain.     nystatin (MYCOSTATIN) 100000 UNIT/ML suspension Take 5 mLs (500,000 Units total) by mouth 4 (four) times daily. Qty: 60 mL, Refills: 0    pantoprazole (PROTONIX) 40 MG tablet Take 40 mg by mouth daily.    zolpidem (AMBIEN) 10 MG tablet Take 10 mg by mouth at bedtime as needed for sleep.        Allergies  Allergen Reactions  . Ciprofloxacin Anaphylaxis  . Aspirin Nausea And Vomiting  . Penicillins Nausea And Vomiting  . Propoxyphene N-Acetaminophen Nausea And Vomiting  . Sulfamethoxazole Rash      The results of significant diagnostics from this hospitalization  (including imaging, microbiology, ancillary and laboratory) are listed below for reference.    Significant Diagnostic Studies: Dg Chest 1 View  10/21/2014   CLINICAL DATA:  Chest pain, unspecified  EXAM: CHEST - 1 VIEW  COMPARISON:  06/05/2014  FINDINGS: Heart and mediastinal contours are distorted by leftward rotation but there is likely cardiomegaly. Low lung volumes without edema or definitive pneumonia. A small linear opacity in the left mid chest favors scarring or atelectasis. No effusion or pneumothorax.  IMPRESSION: 1. No edema or pneumonia. 2. Limited evaluation of the mediastinum due to rotation.   Electronically Signed   By: Jorje Guild M.D.   On: 10/21/2014 03:08   Dg Hip Bilateral W/pelvis  10/21/2014   CLINICAL DATA:  Fall at home, initial encounter. Bilateral hip pain.  EXAM: BILATERAL HIP WITH PELVIS - 4+ VIEW  COMPARISON:  None.  FINDINGS: No fracture or dislocation is noted. Sacroiliac joints appear normal. Both hip joints appear normal.  IMPRESSION: Normal bilateral hips.   Electronically Signed   By: Sabino Dick M.D.   On: 10/21/2014 00:19   Ct Head Wo Contrast  10/21/2014   CLINICAL DATA:  [place] in bathroom today, woke up in bathroom. Legs gave out.  EXAM: CT HEAD WITHOUT CONTRAST  CT CERVICAL SPINE WITHOUT CONTRAST  TECHNIQUE: Multidetector CT imaging of the head and cervical spine was performed following the standard protocol without intravenous contrast. Multiplanar CT image reconstructions of the cervical spine were also generated.  COMPARISON:  None.  FINDINGS: CT HEAD FINDINGS  The ventricles and sulci are normal for age. No intraparenchymal hemorrhage, mass effect nor midline shift. Patchy supratentorial white matter hypodensities are within normal range for patient's age and though non-specific suggest sequelae of chronic small vessel ischemic disease. No acute large vascular territory infarcts. No new focal LEFT parietal hypodensity. No abnormal extra-axial fluid  collections. Basal cisterns are patent. Moderate calcific atherosclerosis of the carotid siphons.  No skull fracture. The included ocular globes and orbital contents are non-suspicious. Status post apparent ocular lens implants. The mastoid aircells and included paranasal sinuses are well-aerated. Patient is edentulous.  CT CERVICAL SPINE FINDINGS  Cervical vertebral bodies and posterior elements are intact and aligned with straightened cervical lordosis. Intervertebral disc heights generally preserved, mild ventral endplate spurring W8-0 and C6-7. C1-2 articulation maintained with moderate arthropathy. Bone mineral density is decreased without destructive bony lesions. Medial course of the carotid arteries. 15 mm LEFT thyroid nodule.  Severe LEFT C2-3 facet arthropathy with moderate to severe at LEFT C2-3, C3-4 neural foraminal narrowing.  IMPRESSION: CT HEAD: LEFT parietal hypodensity suggests remote traumatic or ischemic process though, is new from prior imaging. If clinical concern for acute process, MRI of the brain would be more sensitive.  Involutional changes. Mild white matter changes suggest chronic small vessel ischemic disease.  CT CERVICAL SPINE:  No acute fracture malalignment.  15 mm LEFT thyroid nodule for which follow up thyroid sonogram is recommended on a nonemergent basis.   Electronically Signed   By: Elon Alas   On: 10/21/2014 00:12   Ct Cervical Spine Wo Contrast  10/21/2014   CLINICAL DATA:  [place] in bathroom today, woke up in bathroom. Legs gave out.  EXAM: CT HEAD WITHOUT CONTRAST  CT CERVICAL SPINE WITHOUT CONTRAST  TECHNIQUE: Multidetector CT imaging of the head and cervical spine was performed following the standard protocol without intravenous contrast. Multiplanar CT image reconstructions of the cervical spine were also generated.  COMPARISON:  None.  FINDINGS: CT HEAD FINDINGS  The ventricles and sulci are normal for age. No intraparenchymal hemorrhage, mass effect nor  midline shift. Patchy supratentorial white matter hypodensities are within normal range for patient's age and though non-specific suggest sequelae of chronic small vessel ischemic disease. No acute large vascular territory infarcts. No new focal LEFT parietal hypodensity. No abnormal extra-axial fluid collections. Basal cisterns are patent. Moderate calcific atherosclerosis of the carotid siphons.  No skull fracture. The included ocular globes and orbital contents are non-suspicious. Status post apparent ocular lens implants. The mastoid aircells and included paranasal sinuses are well-aerated. Patient is edentulous.  CT CERVICAL SPINE FINDINGS  Cervical vertebral bodies and posterior elements are intact and aligned with straightened cervical lordosis. Intervertebral disc heights generally preserved, mild ventral endplate spurring H2-1 and C6-7. C1-2 articulation  maintained with moderate arthropathy. Bone mineral density is decreased without destructive bony lesions. Medial course of the carotid arteries. 15 mm LEFT thyroid nodule.  Severe LEFT C2-3 facet arthropathy with moderate to severe at LEFT C2-3, C3-4 neural foraminal narrowing.  IMPRESSION: CT HEAD: LEFT parietal hypodensity suggests remote traumatic or ischemic process though, is new from prior imaging. If clinical concern for acute process, MRI of the brain would be more sensitive.  Involutional changes. Mild white matter changes suggest chronic small vessel ischemic disease.  CT CERVICAL SPINE:  No acute fracture malalignment.  15 mm LEFT thyroid nodule for which follow up thyroid sonogram is recommended on a nonemergent basis.   Electronically Signed   By: Elon Alas   On: 10/21/2014 00:12   Mr Brain Wo Contrast  10/21/2014   CLINICAL DATA:  Stroke.  Incoherent, confusion, and recent fall.  EXAM: MRI HEAD WITHOUT CONTRAST  TECHNIQUE: Multiplanar, multiecho pulse sequences of the brain and surrounding structures were obtained without  intravenous contrast.  COMPARISON:  Head CT 10/20/2014  FINDINGS: There is no evidence of acute infarct, intracranial hemorrhage, mass, midline shift, or extra-axial fluid collection. There is mild generalized cerebral atrophy. Subcortical T2 hyperintensity in the left parietal lobe corresponding to hypodensity on CT and likely reflects an area of chronic infarct given the slight transcortical extension. There is also a a smaller focus of cortical/subcortical chronic infarct in the left occipital lobe. Additional, small foci of T2 hyperintensity elsewhere in the subcortical and deep cerebral white matter bilaterally are nonspecific but compatible with mild chronic small vessel ischemic disease. Mild chronic ischemic changes are also noted in the pons.  Prior bilateral cataract extraction is noted. Moderate left maxillary sinus mucosal thickening is present. Mastoid air cells are clear. A normal distal left vertebral artery flow void is not identified and may reflect occlusion or marked hypoplasia/stenosis. Other major intracranial vascular flow voids are preserved. An 8 mm T2 hyperintense focus is noted in the left parotid gland, possibly a small intraparotid lymph node.  IMPRESSION: 1. No evidence of acute intracranial abnormality. 2. Chronic left parietal and left occipital infarcts. 3. Mild chronic small vessel ischemic disease and cerebral atrophy.   Electronically Signed   By: Logan Bores   On: 10/21/2014 16:07    Microbiology: No results found for this or any previous visit (from the past 240 hour(s)).   Labs: Basic Metabolic Panel:  Recent Labs Lab 10/18/14 1614 10/20/14 2320 10/21/14 0625  NA 143 142 144  K 3.9 3.7 3.7  CL 103 104 107  CO2 27 25 24   GLUCOSE 96 88 84  BUN 19 14 13   CREATININE 2.02* 1.84* 1.72*  CALCIUM 9.5 9.4 9.4   Liver Function Tests:  Recent Labs Lab 10/18/14 1614 10/20/14 2320  AST 9 12  ALT 11 8  ALKPHOS 62 61  BILITOT 0.2* 0.2*  PROT 6.3 6.5  ALBUMIN  3.0* 3.1*   No results for input(s): LIPASE, AMYLASE in the last 168 hours. No results for input(s): AMMONIA in the last 168 hours. CBC:  Recent Labs Lab 10/18/14 1614 10/20/14 2320 10/21/14 0625  WBC 7.6 5.8 6.5  NEUTROABS 4.7 3.5  --   HGB 10.1* 10.0* 9.9*  HCT 30.5* 30.1* 29.9*  MCV 92.7 90.7 90.6  PLT 237 243 237   Cardiac Enzymes:  Recent Labs Lab 10/18/14 1614 10/20/14 2320 10/21/14 0219  CKTOTAL  --  72  --   TROPONINI <0.30  --  <0.30   BNP:  BNP (last 3 results) No results for input(s): PROBNP in the last 8760 hours. CBG:  Recent Labs Lab 10/18/14 1546  GLUCAP 93     Signed:  [place] Mielke S  [facility] 10/22/2014, 1:18 PM

## 2014-10-22 NOTE — Care Management Note (Signed)
    Page 1 of 1   10/22/2014     1:04:45 PM CARE MANAGEMENT NOTE 10/22/2014  Patient:  [name] [name], [name] [name]   Account Number:  [id number]  Date Initiated:  10/22/2014  Documentation initiated by:  Theophilus Kinds  Subjective/Objective Assessment:   Pt admitted from home with encephalopathy. Pt lives alone and will return home at discharge. Pt has a CAP aide M-F, 3-4 hours a day. Pt has neb machine, hospital bed, walker, cane, BSC for home use. Pt  also has a ramp at her back porch.     Action/Plan:   Pt would like w/c. Pts CM with [employer] will have to arrnage and give authorization for that. Pt is aware. Pt refuses any HH RN or PT. No other CM needs noted.   Anticipated DC Date:  10/22/2014   Anticipated DC Plan:  [place]  CM consult      Choice offered to / List presented to:             Status of service:  Completed, signed off Medicare Important Message given?  NA - LOS <3 / Initial given by admissions (If response is "NO", the following Medicare IM given date fields will be blank) Date Medicare IM given:   Medicare IM given by:   Date Additional Medicare IM given:   Additional Medicare IM given by:    Discharge Disposition:  HOME/SELF CARE  Per UR Regulation:    If discussed at Long Length of Stay Meetings, dates discussed:    Comments:  10/22/14 [place], RN BSN CM

## 2014-11-11 DIAGNOSIS — E119 Type 2 diabetes mellitus without complications: Secondary | ICD-10-CM | POA: Diagnosis not present

## 2014-11-11 DIAGNOSIS — Z933 Colostomy status: Secondary | ICD-10-CM | POA: Diagnosis not present

## 2014-11-14 DIAGNOSIS — Z6836 Body mass index (BMI) 36.0-36.9, adult: Secondary | ICD-10-CM | POA: Diagnosis not present

## 2014-11-14 DIAGNOSIS — J449 Chronic obstructive pulmonary disease, unspecified: Secondary | ICD-10-CM | POA: Diagnosis not present

## 2014-11-14 DIAGNOSIS — I10 Essential (primary) hypertension: Secondary | ICD-10-CM | POA: Diagnosis not present

## 2014-11-14 DIAGNOSIS — J45909 Unspecified asthma, uncomplicated: Secondary | ICD-10-CM | POA: Diagnosis not present

## 2014-11-14 DIAGNOSIS — E782 Mixed hyperlipidemia: Secondary | ICD-10-CM | POA: Diagnosis not present

## 2014-11-14 DIAGNOSIS — I509 Heart failure, unspecified: Secondary | ICD-10-CM | POA: Diagnosis not present

## 2014-11-14 DIAGNOSIS — R269 Unspecified abnormalities of gait and mobility: Secondary | ICD-10-CM | POA: Diagnosis not present

## 2014-11-14 DIAGNOSIS — E1129 Type 2 diabetes mellitus with other diabetic kidney complication: Secondary | ICD-10-CM | POA: Diagnosis not present

## 2014-11-15 DIAGNOSIS — R269 Unspecified abnormalities of gait and mobility: Secondary | ICD-10-CM | POA: Diagnosis not present

## 2014-11-18 DIAGNOSIS — Z8673 Personal history of transient ischemic attack (TIA), and cerebral infarction without residual deficits: Secondary | ICD-10-CM | POA: Diagnosis not present

## 2014-11-18 DIAGNOSIS — M069 Rheumatoid arthritis, unspecified: Secondary | ICD-10-CM | POA: Diagnosis not present

## 2014-11-18 DIAGNOSIS — K219 Gastro-esophageal reflux disease without esophagitis: Secondary | ICD-10-CM | POA: Diagnosis not present

## 2014-11-18 DIAGNOSIS — E785 Hyperlipidemia, unspecified: Secondary | ICD-10-CM | POA: Diagnosis not present

## 2014-11-18 DIAGNOSIS — N189 Chronic kidney disease, unspecified: Secondary | ICD-10-CM | POA: Diagnosis not present

## 2014-11-18 DIAGNOSIS — K1370 Unspecified lesions of oral mucosa: Secondary | ICD-10-CM | POA: Diagnosis not present

## 2014-11-18 DIAGNOSIS — E119 Type 2 diabetes mellitus without complications: Secondary | ICD-10-CM | POA: Diagnosis not present

## 2014-11-18 DIAGNOSIS — F329 Major depressive disorder, single episode, unspecified: Secondary | ICD-10-CM | POA: Diagnosis not present

## 2014-11-18 DIAGNOSIS — I129 Hypertensive chronic kidney disease with stage 1 through stage 4 chronic kidney disease, or unspecified chronic kidney disease: Secondary | ICD-10-CM | POA: Diagnosis not present

## 2014-11-18 DIAGNOSIS — J45909 Unspecified asthma, uncomplicated: Secondary | ICD-10-CM | POA: Diagnosis not present

## 2014-11-18 DIAGNOSIS — I10 Essential (primary) hypertension: Secondary | ICD-10-CM | POA: Diagnosis not present

## 2014-11-18 DIAGNOSIS — G4700 Insomnia, unspecified: Secondary | ICD-10-CM | POA: Diagnosis not present

## 2014-11-18 DIAGNOSIS — I2510 Atherosclerotic heart disease of native coronary artery without angina pectoris: Secondary | ICD-10-CM | POA: Diagnosis not present

## 2014-11-18 DIAGNOSIS — E118 Type 2 diabetes mellitus with unspecified complications: Secondary | ICD-10-CM | POA: Diagnosis not present

## 2014-11-21 DIAGNOSIS — C060 Malignant neoplasm of cheek mucosa: Secondary | ICD-10-CM | POA: Diagnosis not present

## 2014-11-21 DIAGNOSIS — Z933 Colostomy status: Secondary | ICD-10-CM | POA: Diagnosis not present

## 2014-11-21 DIAGNOSIS — J320 Chronic maxillary sinusitis: Secondary | ICD-10-CM | POA: Diagnosis not present

## 2014-11-21 DIAGNOSIS — I2510 Atherosclerotic heart disease of native coronary artery without angina pectoris: Secondary | ICD-10-CM | POA: Diagnosis not present

## 2014-11-21 DIAGNOSIS — E119 Type 2 diabetes mellitus without complications: Secondary | ICD-10-CM | POA: Diagnosis not present

## 2014-11-21 DIAGNOSIS — I700 Atherosclerosis of aorta: Secondary | ICD-10-CM | POA: Diagnosis not present

## 2014-11-21 DIAGNOSIS — M47892 Other spondylosis, cervical region: Secondary | ICD-10-CM | POA: Diagnosis not present

## 2014-11-21 DIAGNOSIS — I672 Cerebral atherosclerosis: Secondary | ICD-10-CM | POA: Diagnosis not present

## 2014-11-21 DIAGNOSIS — R221 Localized swelling, mass and lump, neck: Secondary | ICD-10-CM | POA: Diagnosis not present

## 2014-11-27 ENCOUNTER — Encounter: Payer: Self-pay | Admitting: Cardiology

## 2014-11-27 ENCOUNTER — Ambulatory Visit (INDEPENDENT_AMBULATORY_CARE_PROVIDER_SITE_OTHER): Payer: Medicare Other | Admitting: Cardiology

## 2014-11-27 VITALS — BP 94/62 | HR 64 | Ht 62.0 in | Wt 143.0 lb

## 2014-11-27 DIAGNOSIS — I2510 Atherosclerotic heart disease of native coronary artery without angina pectoris: Secondary | ICD-10-CM

## 2014-11-27 DIAGNOSIS — Z136 Encounter for screening for cardiovascular disorders: Secondary | ICD-10-CM | POA: Diagnosis not present

## 2014-11-27 MED ORDER — METOPROLOL TARTRATE 25 MG PO TABS: 25.0000 mg | ORAL_TABLET | Freq: Two times a day (BID) | ORAL | Status: DC

## 2014-11-27 NOTE — Progress Notes (Signed)
Clinical Summary [name] [name] is a 74 y.o.female last seen by Dr Verl Blalock, this is our first visit together. She is seen for the following medical problems.  1. CAD - non-obstructive CAD from cath in 2003 - occasional pain midchest, 3/10 mid to left chest. Can occur at rest or with exertion. Lasts for few minutes. Can feel dizzy, no other symptoms. Occurs approx every 2 weeks. Ongoing for 2-3 years. Unchanged in frequency. Not positional. No relation to food.  - no ischemia  2. Hx of CVA - on plavix for secondary prevention Past Medical History  Diagnosis Date  . Anxiety   . Depression   . Hypertension   . GERD (gastroesophageal reflux disease)   . Hyperlipidemia   . Myocardial infarction   . Stroke     times 2  . Diabetes mellitus without complication      Allergies  Allergen Reactions  . Ciprofloxacin Anaphylaxis  . Aspirin Nausea And Vomiting  . Penicillins Nausea And Vomiting  . Propoxyphene N-Acetaminophen Nausea And Vomiting  . Sulfamethoxazole Rash     Current Outpatient Prescriptions  Medication Sig Dispense Refill  . albuterol (PROVENTIL HFA;VENTOLIN HFA) 108 (90 BASE) MCG/ACT inhaler Inhale 2 puffs into the lungs every 6 (six) hours as needed for wheezing or shortness of breath.     . ALPRAZolam (XANAX) 0.5 MG tablet Take 0.5-1 mg by mouth 4 (four) times daily as needed for anxiety or sleep.     . chlorhexidine (PERIDEX) 0.12 % solution Use as directed 15 mLs in the mouth or throat 2 (two) times daily. 10 day course starting on 10/12/2014    . clopidogrel (PLAVIX) 75 MG tablet Take 1 tablet (75 mg total) by mouth daily. 30 tablet 6  . escitalopram (LEXAPRO) 20 MG tablet Take 20 mg by mouth daily.    Marland Kitchen ezetimibe (ZETIA) 10 MG tablet Take 10 mg by mouth daily.    . furosemide (LASIX) 20 MG tablet Take 20 mg by mouth daily.    Marland Kitchen HYDROcodone-acetaminophen (NORCO/VICODIN) 5-325 MG per tablet Take 1 tablet by mouth every 6 (six) hours as needed for moderate pain. 30  tablet 0  . lidocaine (XYLOCAINE) 2 % solution Use as directed 15 mLs in the mouth or throat as needed for mouth pain (to be used for up to 10 days starting on 10/12/2014).    Marland Kitchen lisinopril (PRINIVIL,ZESTRIL) 2.5 MG tablet Take 2.5 mg by mouth daily.    . metoprolol (LOPRESSOR) 50 MG tablet Take 50 mg by mouth daily.    Marland Kitchen NITROSTAT 0.4 MG SL tablet Place 0.4 mg under the tongue every 5 (five) minutes as needed for chest pain.     Marland Kitchen nystatin (MYCOSTATIN) 100000 UNIT/ML suspension Take 5 mLs (500,000 Units total) by mouth 4 (four) times daily. 60 mL 0  . pantoprazole (PROTONIX) 40 MG tablet Take 40 mg by mouth daily.    Marland Kitchen zolpidem (AMBIEN) 10 MG tablet Take 10 mg by mouth at bedtime as needed for sleep.      No current facility-administered medications for this visit.     Past Surgical History  Procedure Laterality Date  . Abdominal hysterectomy    . Cholecystectomy    . Oophorectomy    . Colostomy    . Tonsillectomy    . Cataract extraction w/phaco Right 03/20/2013    Procedure: CATARACT EXTRACTION PHACO AND INTRAOCULAR LENS PLACEMENT (IOC);  Surgeon: Elta Guadeloupe T. Gershon Crane, MD;  Location: [facility];  Service: Ophthalmology;  Laterality:  Right;  CDE:  6.29      Allergies  Allergen Reactions  . Ciprofloxacin Anaphylaxis  . Aspirin Nausea And Vomiting  . Penicillins Nausea And Vomiting  . Propoxyphene N-Acetaminophen Nausea And Vomiting  . Sulfamethoxazole Rash      Family History  Problem Relation Age of Onset  . Cancer Other   . Diabetes Other   . Abnormal EKG Other   . Arthritis Other   . Asthma Other   . Kidney disease Other      Social History [name] [name] reports that she quit smoking about 42 years ago. Her smoking use included Cigarettes. Her smokeless tobacco use includes Snuff. [name] [name] reports that she does not drink alcohol.   Review of Systems CONSTITUTIONAL: No weight loss, fever, chills, weakness or fatigue.  HEENT: Eyes: No visual loss, blurred vision,  double vision or yellow sclerae.No hearing loss, sneezing, congestion, runny nose or sore throat.  SKIN: No rash or itching.  CARDIOVASCULAR: per HPI RESPIRATORY: No shortness of breath, cough or sputum.  GASTROINTESTINAL: No anorexia, nausea, vomiting or diarrhea. No abdominal pain or blood.  GENITOURINARY: No burning on urination, no polyuria NEUROLOGICAL: No headache, dizziness, syncope, paralysis, ataxia, numbness or tingling in the extremities. No change in bowel or bladder control.  MUSCULOSKELETAL: No muscle, back pain, joint pain or stiffness.  LYMPHATICS: No enlarged nodes. No history of splenectomy.  PSYCHIATRIC: No history of depression or anxiety.  ENDOCRINOLOGIC: No reports of sweating, cold or heat intolerance. No polyuria or polydipsia.  Marland Kitchen   Physical Examination Gen: resting comfortably, no acute distress HEENT: no scleral icterus, pupils equal round and reactive, no palptable cervical adenopathy,  CV: RRR, no m/r/g, no JVD Resp: Clear to auscultation bilaterally GI: abdomen is soft, non-tender, non-distended, normal bowel sounds, no hepatosplenomegaly MSK: extremities are warm, no edema.  Skin: warm, no rash Neuro:  no focal deficits Psych: appropriate affect   Diagnostic Studies Jan 2014 Echo Study Conclusions  - Left ventricle: The cavity size was normal. There was mild concentric hypertrophy. Systolic function was vigorous. The estimated ejection fraction was in the range of 65% to 70%. Wall motion was normal; there were no regional wall motion abnormalities. - Aortic valve: Mildly to moderately calcified annulus. Mildly thickened, mildly calcified leaflets. There wastrivial, if any,stenosis. - Mitral valve: Calcified annulus. - Left atrium: The atrium was mildly dilated. - Right ventricle: The cavity size was normal. Wall thickness was moderately increased. - Atrial septum: No defect or patent foramen ovale  was identified. Impressions:  - Compared to the prior study performed 08/11/11, there has been no significant interval change.  12/2013 Carotid US IMPRESSION: 1. Mild heterogeneous plaque bilaterally without evidence of stenosis or interval progression compared to 02/26/2010. 2. Vertebral arteries are patent with normal antegrade flow.   06/2002 cath PRESSURES: 1. LV: 150/0; LVEDP 18 mmHg. 2. CA: 150/80 mmHg. 3. There was no gradient across the aortic valve on catheter pullback.  ANGIOGRAPHY: Fluoroscopy did not reveal any significant coronary, intracardiac, or valvular calcification. There was a prosthetic ring in place in the area of the stomach and distal esophagus, presumably from the patient's prior hiatal hernia surgery he had done at [facility] several years ago. 1. The main left coronary was normal. 2. The left anterior descending artery had minor irregularity with less than  20-30% minimal narrowing beyond the first diagonal [name] [name]. The distal  third of the LAD was mildly irregular but no significant stenosis or  atherosclerotic lesions with good flow  to the apex where it bifurcated. 3. The first diagonal [name] [name] arose before SP-1 and 2 bifurcated and it was  normal. 4. The circumflex was nondominant but moderate sized. There was a small OM-  1 that was normal and moderately large OM-2 that bifurcated and was  normal, a moderate sized OM-3 that was normal. Beyond this, there was a  40-50% narrowing at the PABG [name] [name] which was small and bifurcated. 5. The right coronary was a dominant vessel. There was dilatation a  centimeter after the proximal portion of the ostia. There was no ostial  or proximal stenosis and the dilatation was mild without aneurysm  formation. The remainder of the vessel was widely patent and smooth with  a small but normal PDA and PLA and normal RV branches. 6. LV angiogram in the RAO and  LAO projection showed a normally contracting  ventricle which was vigorous, EF approximately 60% or greater with  angiographic LVH. There was no mitral regurgitation. 7. Abdominal aortic angiogram in the midstream PA projection showed a normal  SMA and celiac access, normal single right renal artery, and dual normal  left renal arteries. The infrarenal abdominal aorta was widely patent  with no stenosis or aneurysm formation and no significant irregularity.  The common iliacs were normal. The proximal external iliacs were normal,  mildly tortuous, and the hypogastrics were intact with good runoff  bilaterally.  DISCUSSION: This 74 year old patient of Dr. Caron Presume has a remote history of catheterization without significant coronary disease over a decade ago at [facility] but these records are not available. She has morbid obesity, a history of diabetes, past CVAs, nonsmoker with COPD and asthma, remote cholecystectomy, and remote hiatal hernia repair presumably for hiatal hernia and GI bleeding several years ago at [place] [place]. She is admitted now with chest pain felt to be compatible with ischemia. Diagnostic catheterization shows no significant coronary artery disease. There is mild dilatation in the proximal third of the RCA but no aneurysm formation and no significant stenosis. There is less than 50% lesion of the distal circumflex, good normal LV function and mild hypertension.  RECOMMENDATIONS: I would recommend medical therapy. She will probably need GI evaluation with her history of chest pain and past GI disease.  CATHETERIZATION DIAGNOSES: 1. Chest pain etiology not determined. 2. Possible gastroesophageal reflux disease and/or esophageal spasm. 3. Remote hiatal hernia repair, [place]. 4. Remote cholecystectomy. 5. Severe morbid obesity. 6. Osteoarthritis. 7. Bilateral cataracts. 8. Adult onset diabetes mellitus. 9.  Prior cerebrovascular accidents. 10. Remote hysterectomy. 11. History of palpitations.    Assessment and Plan   1. CAD - long history of chest pain unchanged in character, frequency, or severity - previous workup including cath with non-obstructive CAD, and MPI without evidence of ischemia - continue risk factor modification      Arnoldo Lenis, M.D.

## 2014-11-27 NOTE — Patient Instructions (Addendum)
Your physician wants you to follow-up in: 6 months You will receive a reminder letter in the mail two months in advance. If you don't receive a letter, please call our office to schedule the follow-up appointment.  Your physician has recommended you make the following change in your medication:   Lopressor 25 mg 2 times daily   Thank you for choosing [facility]!

## 2014-12-10 DIAGNOSIS — J069 Acute upper respiratory infection, unspecified: Secondary | ICD-10-CM | POA: Diagnosis not present

## 2014-12-10 DIAGNOSIS — Z681 Body mass index (BMI) 19 or less, adult: Secondary | ICD-10-CM | POA: Diagnosis not present

## 2014-12-10 DIAGNOSIS — M6281 Muscle weakness (generalized): Secondary | ICD-10-CM | POA: Diagnosis not present

## 2014-12-16 DIAGNOSIS — J449 Chronic obstructive pulmonary disease, unspecified: Secondary | ICD-10-CM | POA: Diagnosis not present

## 2014-12-16 DIAGNOSIS — Z933 Colostomy status: Secondary | ICD-10-CM | POA: Diagnosis not present

## 2014-12-16 DIAGNOSIS — I509 Heart failure, unspecified: Secondary | ICD-10-CM | POA: Diagnosis not present

## 2014-12-16 DIAGNOSIS — R269 Unspecified abnormalities of gait and mobility: Secondary | ICD-10-CM | POA: Diagnosis not present

## 2014-12-18 ENCOUNTER — Encounter: Payer: Self-pay | Admitting: Cardiology

## 2014-12-22 ENCOUNTER — Emergency Department (HOSPITAL_COMMUNITY): Payer: Medicare Other

## 2014-12-22 ENCOUNTER — Inpatient Hospital Stay (HOSPITAL_COMMUNITY)
Admission: EM | Admit: 2014-12-22 | Discharge: 2014-12-30 | DRG: 682 | Disposition: A | Discharge status: Skilled Nursing Facility | Payer: Medicare Other | Attending: Family Medicine | Admitting: Family Medicine

## 2014-12-22 ENCOUNTER — Encounter (HOSPITAL_COMMUNITY): Payer: Self-pay

## 2014-12-22 DIAGNOSIS — E43 Unspecified severe protein-calorie malnutrition: Secondary | ICD-10-CM | POA: Diagnosis present

## 2014-12-22 DIAGNOSIS — D638 Anemia in other chronic diseases classified elsewhere: Secondary | ICD-10-CM | POA: Diagnosis not present

## 2014-12-22 DIAGNOSIS — C069 Malignant neoplasm of mouth, unspecified: Secondary | ICD-10-CM | POA: Diagnosis present

## 2014-12-22 DIAGNOSIS — R4182 Altered mental status, unspecified: Secondary | ICD-10-CM

## 2014-12-22 DIAGNOSIS — R579 Shock, unspecified: Secondary | ICD-10-CM | POA: Diagnosis not present

## 2014-12-22 DIAGNOSIS — C109 Malignant neoplasm of oropharynx, unspecified: Secondary | ICD-10-CM | POA: Diagnosis not present

## 2014-12-22 DIAGNOSIS — E872 Acidosis, unspecified: Secondary | ICD-10-CM | POA: Diagnosis present

## 2014-12-22 DIAGNOSIS — Z825 Family history of asthma and other chronic lower respiratory diseases: Secondary | ICD-10-CM

## 2014-12-22 DIAGNOSIS — Z7902 Long term (current) use of antithrombotics/antiplatelets: Secondary | ICD-10-CM | POA: Diagnosis not present

## 2014-12-22 DIAGNOSIS — Z833 Family history of diabetes mellitus: Secondary | ICD-10-CM

## 2014-12-22 DIAGNOSIS — I959 Hypotension, unspecified: Secondary | ICD-10-CM

## 2014-12-22 DIAGNOSIS — K219 Gastro-esophageal reflux disease without esophagitis: Secondary | ICD-10-CM | POA: Diagnosis present

## 2014-12-22 DIAGNOSIS — I4891 Unspecified atrial fibrillation: Secondary | ICD-10-CM | POA: Diagnosis not present

## 2014-12-22 DIAGNOSIS — Z87891 Personal history of nicotine dependence: Secondary | ICD-10-CM | POA: Diagnosis not present

## 2014-12-22 DIAGNOSIS — Z933 Colostomy status: Secondary | ICD-10-CM

## 2014-12-22 DIAGNOSIS — I129 Hypertensive chronic kidney disease with stage 1 through stage 4 chronic kidney disease, or unspecified chronic kidney disease: Secondary | ICD-10-CM | POA: Diagnosis present

## 2014-12-22 DIAGNOSIS — D649 Anemia, unspecified: Secondary | ICD-10-CM | POA: Diagnosis not present

## 2014-12-22 DIAGNOSIS — N189 Chronic kidney disease, unspecified: Secondary | ICD-10-CM | POA: Diagnosis not present

## 2014-12-22 DIAGNOSIS — E860 Dehydration: Secondary | ICD-10-CM | POA: Diagnosis not present

## 2014-12-22 DIAGNOSIS — Z79899 Other long term (current) drug therapy: Secondary | ICD-10-CM | POA: Diagnosis not present

## 2014-12-22 DIAGNOSIS — N179 Acute kidney failure, unspecified: Principal | ICD-10-CM | POA: Diagnosis present

## 2014-12-22 DIAGNOSIS — N184 Chronic kidney disease, stage 4 (severe): Secondary | ICD-10-CM | POA: Diagnosis not present

## 2014-12-22 DIAGNOSIS — N281 Cyst of kidney, acquired: Secondary | ICD-10-CM | POA: Diagnosis not present

## 2014-12-22 DIAGNOSIS — N183 Chronic kidney disease, stage 3 (moderate): Secondary | ICD-10-CM | POA: Diagnosis not present

## 2014-12-22 DIAGNOSIS — R571 Hypovolemic shock: Secondary | ICD-10-CM | POA: Diagnosis present

## 2014-12-22 DIAGNOSIS — R262 Difficulty in walking, not elsewhere classified: Secondary | ICD-10-CM | POA: Diagnosis not present

## 2014-12-22 DIAGNOSIS — I10 Essential (primary) hypertension: Secondary | ICD-10-CM

## 2014-12-22 DIAGNOSIS — Z79891 Long term (current) use of opiate analgesic: Secondary | ICD-10-CM | POA: Diagnosis not present

## 2014-12-22 DIAGNOSIS — Z452 Encounter for adjustment and management of vascular access device: Secondary | ICD-10-CM | POA: Diagnosis not present

## 2014-12-22 DIAGNOSIS — D631 Anemia in chronic kidney disease: Secondary | ICD-10-CM | POA: Diagnosis not present

## 2014-12-22 DIAGNOSIS — J9811 Atelectasis: Secondary | ICD-10-CM | POA: Diagnosis not present

## 2014-12-22 DIAGNOSIS — Z6823 Body mass index (BMI) 23.0-23.9, adult: Secondary | ICD-10-CM

## 2014-12-22 DIAGNOSIS — I252 Old myocardial infarction: Secondary | ICD-10-CM | POA: Diagnosis not present

## 2014-12-22 DIAGNOSIS — G9340 Encephalopathy, unspecified: Secondary | ICD-10-CM | POA: Diagnosis not present

## 2014-12-22 DIAGNOSIS — M6281 Muscle weakness (generalized): Secondary | ICD-10-CM | POA: Diagnosis not present

## 2014-12-22 DIAGNOSIS — F419 Anxiety disorder, unspecified: Secondary | ICD-10-CM | POA: Diagnosis not present

## 2014-12-22 DIAGNOSIS — E876 Hypokalemia: Secondary | ICD-10-CM | POA: Diagnosis present

## 2014-12-22 DIAGNOSIS — F329 Major depressive disorder, single episode, unspecified: Secondary | ICD-10-CM | POA: Diagnosis present

## 2014-12-22 DIAGNOSIS — Z66 Do not resuscitate: Secondary | ICD-10-CM | POA: Diagnosis present

## 2014-12-22 DIAGNOSIS — R0600 Dyspnea, unspecified: Secondary | ICD-10-CM | POA: Diagnosis not present

## 2014-12-22 DIAGNOSIS — R634 Abnormal weight loss: Secondary | ICD-10-CM | POA: Diagnosis not present

## 2014-12-22 DIAGNOSIS — R0902 Hypoxemia: Secondary | ICD-10-CM | POA: Diagnosis present

## 2014-12-22 DIAGNOSIS — G9341 Metabolic encephalopathy: Secondary | ICD-10-CM | POA: Diagnosis present

## 2014-12-22 DIAGNOSIS — N186 End stage renal disease: Secondary | ICD-10-CM | POA: Diagnosis not present

## 2014-12-22 DIAGNOSIS — E119 Type 2 diabetes mellitus without complications: Secondary | ICD-10-CM | POA: Diagnosis not present

## 2014-12-22 DIAGNOSIS — I2510 Atherosclerotic heart disease of native coronary artery without angina pectoris: Secondary | ICD-10-CM | POA: Diagnosis present

## 2014-12-22 DIAGNOSIS — R531 Weakness: Secondary | ICD-10-CM | POA: Diagnosis not present

## 2014-12-22 DIAGNOSIS — E785 Hyperlipidemia, unspecified: Secondary | ICD-10-CM | POA: Diagnosis present

## 2014-12-22 DIAGNOSIS — Z8673 Personal history of transient ischemic attack (TIA), and cerebral infarction without residual deficits: Secondary | ICD-10-CM | POA: Diagnosis not present

## 2014-12-22 DIAGNOSIS — R278 Other lack of coordination: Secondary | ICD-10-CM | POA: Diagnosis not present

## 2014-12-22 DIAGNOSIS — E1129 Type 2 diabetes mellitus with other diabetic kidney complication: Secondary | ICD-10-CM | POA: Diagnosis not present

## 2014-12-22 DIAGNOSIS — R1319 Other dysphagia: Secondary | ICD-10-CM | POA: Diagnosis not present

## 2014-12-22 DIAGNOSIS — R404 Transient alteration of awareness: Secondary | ICD-10-CM | POA: Diagnosis not present

## 2014-12-22 DIAGNOSIS — J449 Chronic obstructive pulmonary disease, unspecified: Secondary | ICD-10-CM | POA: Diagnosis not present

## 2014-12-22 DIAGNOSIS — R0609 Other forms of dyspnea: Secondary | ICD-10-CM

## 2014-12-22 LAB — COMPREHENSIVE METABOLIC PANEL
ALBUMIN: 3.4 g/dL — AB (ref 3.5–5.2)
ALK PHOS: 55 U/L (ref 39–117)
ALT: 13 U/L (ref 0–35)
ANION GAP: 20 — AB (ref 5–15)
AST: 21 U/L (ref 0–37)
BUN: 147 mg/dL — ABNORMAL HIGH (ref 6–23)
CO2: 10 mmol/L — CL (ref 19–32)
CREATININE: 13.4 mg/dL — AB (ref 0.50–1.10)
Calcium: 9 mg/dL (ref 8.4–10.5)
Chloride: 103 mmol/L (ref 96–112)
GFR calc Af Amer: 3 mL/min — ABNORMAL LOW (ref >90)
GFR calc non Af Amer: 2 mL/min — ABNORMAL LOW (ref >90)
Glucose, Bld: 120 mg/dL — ABNORMAL HIGH (ref 70–99)
POTASSIUM: 4.8 mmol/L (ref 3.5–5.1)
Sodium: 133 mmol/L — ABNORMAL LOW (ref 135–145)
Total Bilirubin: 0.4 mg/dL (ref 0.3–1.2)
Total Protein: 6.8 g/dL (ref 6.0–8.3)

## 2014-12-22 LAB — CBC WITH DIFFERENTIAL/PLATELET
Basophils Absolute: 0 10*3/uL (ref 0.0–0.1)
Basophils Relative: 0 % (ref 0–1)
Eosinophils Absolute: 0 10*3/uL (ref 0.0–0.7)
Eosinophils Relative: 0 % (ref 0–5)
HEMATOCRIT: 33.9 % — AB (ref 36.0–46.0)
Hemoglobin: 11.7 g/dL — ABNORMAL LOW (ref 12.0–15.0)
LYMPHS PCT: 12 % (ref 12–46)
Lymphs Abs: 1.4 10*3/uL (ref 0.7–4.0)
MCH: 29.3 pg (ref 26.0–34.0)
MCHC: 34.5 g/dL (ref 30.0–36.0)
MCV: 84.8 fL (ref 78.0–100.0)
MONO ABS: 0.7 10*3/uL (ref 0.1–1.0)
MONOS PCT: 6 % (ref 3–12)
NEUTROS ABS: 9.7 10*3/uL — AB (ref 1.7–7.7)
Neutrophils Relative %: 82 % — ABNORMAL HIGH (ref 43–77)
Platelets: 294 10*3/uL (ref 150–400)
RBC: 4 MIL/uL (ref 3.87–5.11)
RDW: 13.4 % (ref 11.5–15.5)
WBC: 11.8 10*3/uL — AB (ref 4.0–10.5)

## 2014-12-22 LAB — URINALYSIS, ROUTINE W REFLEX MICROSCOPIC
GLUCOSE, UA: NEGATIVE mg/dL
LEUKOCYTES UA: NEGATIVE
Nitrite: NEGATIVE
PROTEIN: NEGATIVE mg/dL
Urobilinogen, UA: 0.2 mg/dL (ref 0.0–1.0)
pH: 5 (ref 5.0–8.0)

## 2014-12-22 LAB — I-STAT CG4 LACTIC ACID, ED: Lactic Acid, Venous: 1.22 mmol/L (ref 0.5–2.0)

## 2014-12-22 LAB — URINE MICROSCOPIC-ADD ON

## 2014-12-22 MED ORDER — ENOXAPARIN SODIUM 30 MG/0.3ML ~~LOC~~ SOLN
30.0000 mg | SUBCUTANEOUS | Status: DC
  Administered 2014-12-22 – 2014-12-24 (×3): 30 mg via SUBCUTANEOUS
  Filled 2014-12-22 (×2): qty 0.3

## 2014-12-22 MED ORDER — NITROGLYCERIN 0.4 MG SL SUBL: 0.4000 mg | SUBLINGUAL_TABLET | SUBLINGUAL | Status: DC | PRN

## 2014-12-22 MED ORDER — CLOPIDOGREL BISULFATE 75 MG PO TABS
75.0000 mg | ORAL_TABLET | Freq: Every day | ORAL | Status: DC
  Administered 2014-12-22 – 2014-12-30 (×9): 75 mg via ORAL
  Filled 2014-12-22 (×9): qty 1

## 2014-12-22 MED ORDER — ESCITALOPRAM OXALATE 10 MG PO TABS
20.0000 mg | ORAL_TABLET | Freq: Every day | ORAL | Status: DC
  Administered 2014-12-22 – 2014-12-30 (×9): 20 mg via ORAL
  Filled 2014-12-22: qty 2
  Filled 2014-12-22: qty 1
  Filled 2014-12-22 (×4): qty 2
  Filled 2014-12-22: qty 1
  Filled 2014-12-22: qty 2
  Filled 2014-12-22: qty 1
  Filled 2014-12-22 (×3): qty 2

## 2014-12-22 MED ORDER — LIDOCAINE VISCOUS 2 % MT SOLN
15.0000 mL | Freq: Once | OROMUCOSAL | Status: AC
  Administered 2014-12-22: 15 mL via OROMUCOSAL
  Filled 2014-12-22: qty 15

## 2014-12-22 MED ORDER — SODIUM CHLORIDE 0.9 % IV SOLN
Freq: Once | INTRAVENOUS | Status: AC
  Administered 2014-12-22: 19:00:00 via INTRAVENOUS

## 2014-12-22 MED ORDER — LIDOCAINE VISCOUS 2 % MT SOLN: 15.0000 mL | OROMUCOSAL | Status: DC | PRN

## 2014-12-22 MED ORDER — PANTOPRAZOLE SODIUM 40 MG PO TBEC
40.0000 mg | DELAYED_RELEASE_TABLET | Freq: Every day | ORAL | Status: DC
  Administered 2014-12-22 – 2014-12-30 (×9): 40 mg via ORAL
  Filled 2014-12-22 (×10): qty 1

## 2014-12-22 MED ORDER — SODIUM CHLORIDE 0.9 % IV SOLN
INTRAVENOUS | Status: DC
  Administered 2014-12-22 – 2014-12-23 (×2): via INTRAVENOUS

## 2014-12-22 MED ORDER — ONDANSETRON HCL 4 MG/2ML IJ SOLN
4.0000 mg | Freq: Four times a day (QID) | INTRAMUSCULAR | Status: DC | PRN
  Administered 2014-12-25: 4 mg via INTRAVENOUS
  Filled 2014-12-22: qty 2

## 2014-12-22 MED ORDER — SODIUM CHLORIDE 0.9 % IJ SOLN
3.0000 mL | Freq: Two times a day (BID) | INTRAMUSCULAR | Status: DC
  Administered 2014-12-22 – 2014-12-29 (×9): 3 mL via INTRAVENOUS

## 2014-12-22 MED ORDER — HYDROCODONE-ACETAMINOPHEN 10-325 MG PO TABS
1.0000 | ORAL_TABLET | Freq: Four times a day (QID) | ORAL | Status: DC | PRN
  Administered 2014-12-24 – 2014-12-30 (×11): 1 via ORAL
  Filled 2014-12-22 (×11): qty 1

## 2014-12-22 MED ORDER — ALPRAZOLAM 0.5 MG PO TABS: 0.5000 mg | ORAL_TABLET | Freq: Four times a day (QID) | ORAL | Status: DC | PRN

## 2014-12-22 MED ORDER — ONDANSETRON HCL 4 MG PO TABS: 4.0000 mg | ORAL_TABLET | Freq: Four times a day (QID) | ORAL | Status: DC | PRN

## 2014-12-22 MED ORDER — SODIUM CHLORIDE 0.9 % IV BOLUS (SEPSIS): 1000.0000 mL | Freq: Once | INTRAVENOUS | Status: AC

## 2014-12-22 MED ORDER — ALPRAZOLAM 0.5 MG PO TABS
0.5000 mg | ORAL_TABLET | Freq: Four times a day (QID) | ORAL | Status: DC | PRN
  Administered 2014-12-26 – 2014-12-29 (×5): 0.5 mg via ORAL
  Filled 2014-12-22 (×5): qty 1

## 2014-12-22 MED ORDER — ALBUTEROL SULFATE HFA 108 (90 BASE) MCG/ACT IN AERS
2.0000 | INHALATION_SPRAY | Freq: Four times a day (QID) | RESPIRATORY_TRACT | Status: DC | PRN
  Filled 2014-12-22: qty 6.7

## 2014-12-22 MED ORDER — SODIUM CHLORIDE 0.9 % IV BOLUS (SEPSIS)
1000.0000 mL | Freq: Once | INTRAVENOUS | Status: AC
  Administered 2014-12-22: 1000 mL via INTRAVENOUS

## 2014-12-22 NOTE — ED Notes (Signed)
CRITICAL VALUE ALERT  Critical value received: CO2 10  Date of notification:  12/22/14  Time of notification:  8264  Critical value read back: yes   Nurse who received alert: Di Kindle, RN  MD notified (1st page): Dr Vanita Panda  Time of first page:  1738  MD notified (2nd page):  Time of second page:  Responding MD:  RL  Time MD responded:  [phone]

## 2014-12-22 NOTE — H&P (Signed)
[facility] History and Physical  [name] [name] QAS:[id number] DOB: 09-02-1941 DOA: 12/22/2014  Referring physician: ER PCP: Glo Herring., MD   Chief Complaint: Altered mental status. Anorexia.  HPI: [name] [name] is a 74 y.o. female  This is a 74 year old lady who has had worsening altered mental status in the last few days. She has had progressive decline in her overall health in the last month since she had treatment for oral cancer approximately one month ago at [facility]. She was due to go to oncology again for further treatment but has become 2 weeks now. The patient herself cannot give me any clear history as of her altered mental status.   Review of Systems:  Unable to obtain secondary to altered mental status.  Past Medical History  Diagnosis Date  . Anxiety   . Depression   . Hypertension   . GERD (gastroesophageal reflux disease)   . Hyperlipidemia   . Myocardial infarction   . Stroke     times 2  . Diabetes mellitus without complication    Past Surgical History  Procedure Laterality Date  . Abdominal hysterectomy    . Cholecystectomy    . Oophorectomy    . Colostomy    . Tonsillectomy    . Cataract extraction w/phaco Right 03/20/2013    Procedure: CATARACT EXTRACTION PHACO AND INTRAOCULAR LENS PLACEMENT (IOC);  Surgeon: Elta Guadeloupe T. Gershon Crane, MD;  Location: [facility];  Service: Ophthalmology;  Laterality: Right;  CDE:  6.29    Social History:  reports that she quit smoking about 42 years ago. Her smoking use included Cigarettes. She started smoking about 58 years ago. She has a 3.75 pack-year smoking history. Her smokeless tobacco use includes Snuff. She reports that she does not drink alcohol or use illicit drugs.  Allergies  Allergen Reactions  . Ciprofloxacin Anaphylaxis  . Aspirin Nausea And Vomiting  . Penicillins Nausea And Vomiting  . Propoxyphene N-Acetaminophen Nausea And Vomiting  . Sulfamethoxazole Rash    Family History  Problem  Relation Age of Onset  . Cancer Other   . Diabetes Other   . Abnormal EKG Other   . Arthritis Other   . Asthma Other   . Kidney disease Other      Prior to Admission medications   Medication Sig Start Date End Date Taking? Authorizing Provider  acetaminophen (TYLENOL) 500 MG tablet Take 1,000 mg by mouth every 6 (six) hours as needed.   Yes Historical Provider, MD  albuterol (PROVENTIL HFA;VENTOLIN HFA) 108 (90 BASE) MCG/ACT inhaler Inhale 2 puffs into the lungs every 6 (six) hours as needed for wheezing or shortness of breath.    Yes Historical Provider, MD  ALPRAZolam Duanne Moron) 0.5 MG tablet Take 0.5-1 mg by mouth 4 (four) times daily as needed for anxiety or sleep.    Yes Historical Provider, MD  clopidogrel (PLAVIX) 75 MG tablet Take 1 tablet (75 mg total) by mouth daily. 09/26/12  Yes Renella Cunas, MD  escitalopram (LEXAPRO) 20 MG tablet Take 20 mg by mouth daily.   Yes Historical Provider, MD  ezetimibe (ZETIA) 10 MG tablet Take 10 mg by mouth daily.   Yes Historical Provider, MD  furosemide (LASIX) 20 MG tablet Take 20 mg by mouth daily.   Yes Historical Provider, MD  HYDROcodone-acetaminophen (NORCO) 10-325 MG per tablet Take 1 tablet by mouth every 6 (six) hours as needed for moderate pain.   Yes Historical Provider, MD  lidocaine (XYLOCAINE) 2 % solution Use as  directed 15 mLs in the mouth or throat as needed for mouth pain (to be used for up to 10 days starting on 10/12/2014).   Yes Historical Provider, MD  lisinopril (PRINIVIL,ZESTRIL) 2.5 MG tablet Take 2.5 mg by mouth daily.   Yes Historical Provider, MD  metoprolol tartrate (LOPRESSOR) 25 MG tablet Take 1 tablet (25 mg total) by mouth 2 (two) times daily. 11/27/14  Yes Arnoldo Lenis, MD  pantoprazole (PROTONIX) 40 MG tablet Take 40 mg by mouth daily.   Yes Historical Provider, MD  zolpidem (AMBIEN) 10 MG tablet Take 10 mg by mouth at bedtime.    Yes Historical Provider, MD  chlorhexidine (PERIDEX) 0.12 % solution Use as  directed 15 mLs in the mouth or throat 2 (two) times daily. 10 day course starting on 10/12/2014    Historical Provider, MD  HYDROcodone-acetaminophen (NORCO/VICODIN) 5-325 MG per tablet Take 1 tablet by mouth every 6 (six) hours as needed for moderate pain. Patient not taking: Reported on 12/22/2014 10/22/14   Oswald Hillock, MD  NITROSTAT 0.4 MG SL tablet Place 0.4 mg under the tongue every 5 (five) minutes as needed for chest pain.  07/28/12   Historical Provider, MD  nystatin (MYCOSTATIN) 100000 UNIT/ML suspension Take 5 mLs (500,000 Units total) by mouth 4 (four) times daily. Patient not taking: Reported on 12/22/2014 10/18/14   Hoy Morn, MD   Physical Exam: Filed Vitals:   12/22/14 1830 12/22/14 1904 12/22/14 1930 12/22/14 2003  BP: 66/40 74/45 76/50  78/41  Pulse: 92 94  98  Temp:      TempSrc:      Resp: 16 16 17 19   Height:      Weight:      SpO2: 99% 95% 98% 98%    Wt Readings from Last 3 Encounters:  12/22/14 63.504 kg (140 lb)  11/27/14 64.864 kg (143 lb)  10/21/14 88 kg (194 lb 0.1 oz)    General:  Appears  clinically dehydrated. She is hypotensive. Eyes: PERRL, normal lids, irises & conjunctiva ENT: grossly normal hearing, lips & tongue Neck: no LAD, masses or thyromegaly Cardiovascular: RRR, no m/r/g. No LE edema. Telemetry: SR, no arrhythmias  Respiratory: CTA bilaterally, no w/r/r. Normal respiratory effort. Abdomen: soft, ntnd Skin: no rash or induration seen on limited exam Musculoskeletal: grossly normal tone BUE/BLE Psychiatric: not examined. Neurologic: grossly non-focal.She is confused.          Labs on Admission:  Basic Metabolic Panel:  Recent Labs Lab 12/22/14 1639  NA 133*  K 4.8  CL 103  CO2 10*  GLUCOSE 120*  BUN 147*  CREATININE 13.40*  CALCIUM 9.0   Liver Function Tests:  Recent Labs Lab 12/22/14 1639  AST 21  ALT 13  ALKPHOS 55  BILITOT 0.4  PROT 6.8  ALBUMIN 3.4*   No results for input(s): LIPASE, AMYLASE in the last  168 hours. No results for input(s): AMMONIA in the last 168 hours. CBC:  Recent Labs Lab 12/22/14 1639  WBC 11.8*  NEUTROABS 9.7*  HGB 11.7*  HCT 33.9*  MCV 84.8  PLT 294   Cardiac Enzymes: No results for input(s): CKTOTAL, CKMB, CKMBINDEX, TROPONINI in the last 168 hours.  BNP (last 3 results) No results for input(s): BNP in the last 8760 hours.  ProBNP (last 3 results) No results for input(s): PROBNP in the last 8760 hours.  CBG: No results for input(s): GLUCAP in the last 168 hours.  Radiological Exams on Admission: Ct Abdomen Pelvis Wo  Contrast  12/22/2014   CLINICAL DATA:  Weight loss. Patient not eating or voiding for the past 3 days. Abdominal mass. Bowel obstruction.  EXAM: CT ABDOMEN AND PELVIS WITHOUT CONTRAST  TECHNIQUE: Multidetector CT imaging of the abdomen and pelvis was performed following the standard protocol without IV contrast.  COMPARISON:  04/26/2007.  FINDINGS: Musculoskeletal: No aggressive osseous lesions. No thoracolumbar compression fractures. Diffuse calcification of the L5-S1 disc. Fatty atrophy of the gluteal muscles and abdominal wall.  Lung Bases: Scattered areas of atelectasis and scarring at the bases. Coronary artery atherosclerosis is present. If office based assessment of coronary risk factors has not been performed, it is now recommended.  Liver: Unenhanced CT was performed per clinician order. Lack of IV contrast limits sensitivity and specificity, especially for evaluation of abdominal/pelvic solid viscera. Grossly normal.  Spleen:  Normal.  Gallbladder:  Surgically absent.  Common bile duct:  Normal.  Pancreas:  Normal.  Adrenal glands:  Normal bilaterally.  Kidneys: Bilateral simple renal cysts. Ureters appear within normal limits. No calculi. No hydronephrosis.  Stomach: Gastric band is present around the cardia of the stomach. There is no obstruction. Fundus appears within normal limits.  Small bowel: Duodenum is normal. No small bowel  obstruction. Small bowel is predominantly decompressed.  Colon: Normal appendix. Fluid levels are present within the colon which are abnormal but nonspecific. These are most commonly associated with enteric infection. LEFT lower quadrant end colostomy is present. Hartmann's pouch in the anatomic pelvis.  Pelvic Genitourinary: Hysterectomy. Urinary bladder appears normal.  Peritoneum: No free air.  No free fluid.  Vasculature: Mild atherosclerosis.  Body Wall: Atrophy.  Negative for hernia.  Midline scarring.  IMPRESSION: 1. Abnormal but nonspecific fluid levels within the colon, most commonly associated with enteric infection. 2. Negative for small bowel obstruction or mass. 3. Simple bilateral renal cysts. 4. Gastric band. 5. Atherosclerosis and coronary artery disease. 6. LEFT lower quadrant end colostomy and Hartman's pouch.   Electronically Signed   By: Dereck Ligas M.D.   On: 12/22/2014 20:14   Dg Chest Port 1 View  12/22/2014   CLINICAL DATA:  COPD. Atrial fibrillation confusion and weakness. Previous stroke.  EXAM: PORTABLE CHEST - 1 VIEW  COMPARISON:  10/21/2014  FINDINGS: The heart size and mediastinal contours are within normal limits. Low lung volumes are noted, however both lungs are clear. No evidence of pleural effusion.  IMPRESSION: No active disease.   Electronically Signed   By: Earle Gell M.D.   On: 12/22/2014 17:15    EKG: Independently reviewed. Normal sinus rhythm without any acute ST-T wave changes.  Assessment/Plan   1. Acute on chronic renal failure. The etiology is prerenal secondary to dehydration which is secondary to poor by mouth intake in the last month. This is because of her oral cancer. We will treat her with fairly aggressive intravenous fluids, especially since she is hypotensive. We mainly to give her fluid boluses. We will admit her to the stepdown unit. CT scan of the abdomen does not show any obvious obstruction. We will ask nephrology to see her in  consultation. 2. Hypertension, currently hypotensive. I will hold antihypertensive medications for the time being. 3. Acute encephalopathy. This is secondary to #1. 4. 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  Further recommendations will depend on patient's hospital progress.  Code Status: DO NOT RESUSCITATE. This was confirmed with family members.    DVT Prophylaxis:Lovenox.   Family  Communication: I discussed the plan with family members. They understand and agree.    Disposition Plan: Depending on progress.    Time spent: 60 minutes.  Doree Albee [facility] Pager [phone].

## 2014-12-22 NOTE — ED Notes (Signed)
IV of NS 1000 cc started by EMS prior to arrival

## 2014-12-22 NOTE — ED Provider Notes (Signed)
CSN: [id number]     Arrival date & time 12/22/14  1627 History   First MD Initiated Contact with Patient 12/22/14 1631     Chief Complaint  Patient presents with  . Anorexia     (Consider location/radiation/quality/duration/timing/severity/associated sxs/prior Treatment) HPI Level V caveat secondary to altered mental status. History is provided by multiple family members. Per report the patient was doing generally well until a few days ago.  Since that time she has had progressive decline in interactivity, as well as increasing anorexia, decreased urine output. No report of new falls, vomiting, fever. Patient had a similar episode 2 months ago, according to family members. Since that episode she had improved, nearly to baseline. However, the patient has missed one appointment for additional evaluation of known oral pharyngeal malignancy in the past week due to her change from baseline.  Past Medical History  Diagnosis Date  . Anxiety   . Depression   . Hypertension   . GERD (gastroesophageal reflux disease)   . Hyperlipidemia   . Myocardial infarction   . Stroke     times 2  . Diabetes mellitus without complication    Past Surgical History  Procedure Laterality Date  . Abdominal hysterectomy    . Cholecystectomy    . Oophorectomy    . Colostomy    . Tonsillectomy    . Cataract extraction w/phaco Right 03/20/2013    Procedure: CATARACT EXTRACTION PHACO AND INTRAOCULAR LENS PLACEMENT (IOC);  Surgeon: Elta Guadeloupe T. Gershon Crane, MD;  Location: [facility];  Service: Ophthalmology;  Laterality: Right;  CDE:  6.29    Family History  Problem Relation Age of Onset  . Cancer Other   . Diabetes Other   . Abnormal EKG Other   . Arthritis Other   . Asthma Other   . Kidney disease Other    History  Substance Use Topics  . Smoking status: Former Smoker -- 0.25 packs/day for 15 years    Types: Cigarettes    Start date: 12/12/1956    Quit date: 08/18/1972  . Smokeless tobacco: Current User      Types: Snuff     Comment: 1-2 cigs daily x 6 months  . Alcohol Use: No   OB History    No data available     Review of Systems  Unable to perform ROS: Mental status change      Allergies  Ciprofloxacin; Aspirin; Penicillins; Propoxyphene n-acetaminophen; and Sulfamethoxazole  Home Medications   Prior to Admission medications   Medication Sig Start Date End Date Taking? Authorizing Provider  acetaminophen (TYLENOL) 500 MG tablet Take 1,000 mg by mouth every 6 (six) hours as needed.   Yes Historical Provider, MD  albuterol (PROVENTIL HFA;VENTOLIN HFA) 108 (90 BASE) MCG/ACT inhaler Inhale 2 puffs into the lungs every 6 (six) hours as needed for wheezing or shortness of breath.    Yes Historical Provider, MD  ALPRAZolam Duanne Moron) 0.5 MG tablet Take 0.5-1 mg by mouth 4 (four) times daily as needed for anxiety or sleep.    Yes Historical Provider, MD  clopidogrel (PLAVIX) 75 MG tablet Take 1 tablet (75 mg total) by mouth daily. 09/26/12  Yes Renella Cunas, MD  escitalopram (LEXAPRO) 20 MG tablet Take 20 mg by mouth daily.   Yes Historical Provider, MD  ezetimibe (ZETIA) 10 MG tablet Take 10 mg by mouth daily.   Yes Historical Provider, MD  furosemide (LASIX) 20 MG tablet Take 20 mg by mouth daily.   Yes Historical Provider, MD  HYDROcodone-acetaminophen (NORCO) 10-325 MG per tablet Take 1 tablet by mouth every 6 (six) hours as needed for moderate pain.   Yes Historical Provider, MD  lidocaine (XYLOCAINE) 2 % solution Use as directed 15 mLs in the mouth or throat as needed for mouth pain (to be used for up to 10 days starting on 10/12/2014).   Yes Historical Provider, MD  lisinopril (PRINIVIL,ZESTRIL) 2.5 MG tablet Take 2.5 mg by mouth daily.   Yes Historical Provider, MD  metoprolol tartrate (LOPRESSOR) 25 MG tablet Take 1 tablet (25 mg total) by mouth 2 (two) times daily. 11/27/14  Yes Arnoldo Lenis, MD  pantoprazole (PROTONIX) 40 MG tablet Take 40 mg by mouth daily.   Yes Historical  Provider, MD  zolpidem (AMBIEN) 10 MG tablet Take 10 mg by mouth at bedtime.    Yes Historical Provider, MD  chlorhexidine (PERIDEX) 0.12 % solution Use as directed 15 mLs in the mouth or throat 2 (two) times daily. 10 day course starting on 10/12/2014    Historical Provider, MD  HYDROcodone-acetaminophen (NORCO/VICODIN) 5-325 MG per tablet Take 1 tablet by mouth every 6 (six) hours as needed for moderate pain. Patient not taking: Reported on 12/22/2014 10/22/14   Oswald Hillock, MD  NITROSTAT 0.4 MG SL tablet Place 0.4 mg under the tongue every 5 (five) minutes as needed for chest pain.  07/28/12   Historical Provider, MD  nystatin (MYCOSTATIN) 100000 UNIT/ML suspension Take 5 mLs (500,000 Units total) by mouth 4 (four) times daily. Patient not taking: Reported on 12/22/2014 10/18/14   Hoy Morn, MD   BP 86/37 mmHg  Pulse 85  Temp(Src) 97.4 F (36.3 C) (Oral)  Resp 15  Ht 5\' 5"  (1.651 m)  Wt 140 lb (63.504 kg)  BMI 23.30 kg/m2  SpO2 100% Physical Exam  Constitutional: She is oriented to person, place, and time. She appears listless. She appears ill. No distress.  Elderly, frail-appearing female, essentially non-participatory in the exam, though she occasionally answers questions briefly  HENT:  Head: Normocephalic and atraumatic.    Eyes: Conjunctivae and EOM are normal.  Neck: No tracheal deviation present.  Cardiovascular: Normal rate and regular rhythm.   Pulmonary/Chest: Effort normal and breath sounds normal. No stridor. No respiratory distress.  Abdominal: She exhibits no distension.    Musculoskeletal: She exhibits no edema.  Neurological: She is oriented to person, place, and time. She appears listless. She displays atrophy. She displays no tremor. No cranial nerve deficit or sensory deficit. She exhibits abnormal muscle tone.  Patient intermittently answers questions properly, but is essentially listless, non-participatory in the exam.  Skin: Skin is warm and dry.    Psychiatric: She has a normal mood and affect. Cognition and memory are impaired.  Nursing note and vitals reviewed.   ED Course  Procedures (including critical care time) Labs Review Labs Reviewed  COMPREHENSIVE METABOLIC PANEL - Abnormal; Notable for the following:    Sodium 133 (*)    CO2 10 (*)    Glucose, Bld 120 (*)    BUN 147 (*)    Creatinine, Ser 13.40 (*)    Albumin 3.4 (*)    GFR calc non Af Amer 2 (*)    GFR calc Af Amer 3 (*)    Anion gap 20 (*)    All other components within normal limits  CBC WITH DIFFERENTIAL/PLATELET - Abnormal; Notable for the following:    WBC 11.8 (*)    Hemoglobin 11.7 (*)    HCT 33.9 (*)  Neutrophils Relative % 82 (*)    Neutro Abs 9.7 (*)    All other components within normal limits  URINALYSIS, ROUTINE W REFLEX MICROSCOPIC  I-STAT CG4 LACTIC ACID, ED    Imaging Review Ct Abdomen Pelvis Wo Contrast  12/22/2014   CLINICAL DATA:  Weight loss. Patient not eating or voiding for the past 3 days. Abdominal mass. Bowel obstruction.  EXAM: CT ABDOMEN AND PELVIS WITHOUT CONTRAST  TECHNIQUE: Multidetector CT imaging of the abdomen and pelvis was performed following the standard protocol without IV contrast.  COMPARISON:  04/26/2007.  FINDINGS: Musculoskeletal: No aggressive osseous lesions. No thoracolumbar compression fractures. Diffuse calcification of the L5-S1 disc. Fatty atrophy of the gluteal muscles and abdominal wall.  Lung Bases: Scattered areas of atelectasis and scarring at the bases. Coronary artery atherosclerosis is present. If office based assessment of coronary risk factors has not been performed, it is now recommended.  Liver: Unenhanced CT was performed per clinician order. Lack of IV contrast limits sensitivity and specificity, especially for evaluation of abdominal/pelvic solid viscera. Grossly normal.  Spleen:  Normal.  Gallbladder:  Surgically absent.  Common bile duct:  Normal.  Pancreas:  Normal.  Adrenal glands:  Normal  bilaterally.  Kidneys: Bilateral simple renal cysts. Ureters appear within normal limits. No calculi. No hydronephrosis.  Stomach: Gastric band is present around the cardia of the stomach. There is no obstruction. Fundus appears within normal limits.  Small bowel: Duodenum is normal. No small bowel obstruction. Small bowel is predominantly decompressed.  Colon: Normal appendix. Fluid levels are present within the colon which are abnormal but nonspecific. These are most commonly associated with enteric infection. LEFT lower quadrant end colostomy is present. Hartmann's pouch in the anatomic pelvis.  Pelvic Genitourinary: Hysterectomy. Urinary bladder appears normal.  Peritoneum: No free air.  No free fluid.  Vasculature: Mild atherosclerosis.  Body Wall: Atrophy.  Negative for hernia.  Midline scarring.  IMPRESSION: 1. Abnormal but nonspecific fluid levels within the colon, most commonly associated with enteric infection. 2. Negative for small bowel obstruction or mass. 3. Simple bilateral renal cysts. 4. Gastric band. 5. Atherosclerosis and coronary artery disease. 6. LEFT lower quadrant end colostomy and Hartman's pouch.   Electronically Signed   By: Dereck Ligas M.D.   On: 12/22/2014 20:14   Dg Chest Port 1 View  12/22/2014   CLINICAL DATA:  COPD. Atrial fibrillation confusion and weakness. Previous stroke.  EXAM: PORTABLE CHEST - 1 VIEW  COMPARISON:  10/21/2014  FINDINGS: The heart size and mediastinal contours are within normal limits. Low lung volumes are noted, however both lungs are clear. No evidence of pleural effusion.  IMPRESSION: No active disease.   Electronically Signed   By: Earle Gell M.D.   On: 12/22/2014 17:15     EKG Interpretation   Date/Time:  Sunday December 22 2014 16:38:21 EST Ventricular Rate:  88 PR Interval:  186 QRS Duration: 103 QT Interval:  395 QTC Calculation: 478 R Axis:   -50 Text Interpretation:  Sinus rhythm Ventricular premature complex Aberrant  conduction  of SV complex(es) Consider right ventricular hypertrophy  Inferior infarct, old Probable anterolateral infarct, old Sinus rhythm  Artifact Abnormal ekg Confirmed by Carmin Muskrat  MD [phone]) on  12/22/2014 5:03:02 PM     I reviewed the patient's chart, including hospitalization notes from last year when she was hospitalized for encephalopathy.  Update: Initial labs notable for carbon dioxide of 10 creatinine of 13. Patient was empirically resuscitated with fluid, this will continue.  Update: Blood pressure 66/40   Update: I discussed all findings at length with 3 of the patient's children, and their companions. Each child indicates that the patient has voiced DO NOT RESUSCITATE preference. Family also indicates that they understand all results, poor prognosis.  Update: Patient states that she feels"fine"but is disoriented.  Blood pressure 85/50  7:35 PM I discussed the case with Dr. Lowanda Foster.  We agreed on fluid resuscitation, CT to r/o obstruction and he will eval the patient in the AM.   MDM   This elderly female with chronic kidney disease, oral pharyngeal malignancy, colostomy now presents with several days of anorexia, decreased interactivity. On initial exam the patient is non-participatory, grossly encephalopathic.  Patient has waxing/waning interactivity throughout her emergency department course. Patient received empiric fluid resuscitation, and after she was found to have substantial decrease in her renal function continued to receive fluid rehydration. After lengthy conversation with the patient's family, regarding goals of care, the patient was admitted for further evaluation and management.  CRITICAL CARE Performed by: Carmin Muskrat Total critical care time: 35 Critical care time was exclusive of separately billable procedures and treating other patients. Critical care was necessary to treat or prevent imminent or life-threatening deterioration. Critical care was  time spent personally by me on the following activities: development of treatment plan with patient and/or surrogate as well as nursing, discussions with consultants, evaluation of patient's response to treatment, examination of patient, obtaining history from patient or surrogate, ordering and performing treatments and interventions, ordering and review of laboratory studies, ordering and review of radiographic studies, pulse oximetry and re-evaluation of patient's condition.   Carmin Muskrat, MD 12/22/14 2116

## 2014-12-22 NOTE — ED Notes (Signed)
Dr. Anastasio Champion notified of blood pressure. Stated to give 500 mL bolus

## 2014-12-22 NOTE — ED Notes (Signed)
Per EMS, pt here from home for evaluation of not eating or drinking over the past three days. Also not voiding

## 2014-12-22 NOTE — ED Notes (Addendum)
Dr. Marva Panda gathered the family in the Family Room. Family stated to Dr. Marva Panda that [name] [name] had spoke to several of them previously about DNR status, which is her wish (DNR). Daughter at bedside at present time.

## 2014-12-23 ENCOUNTER — Encounter (HOSPITAL_COMMUNITY): Payer: Medicare Other

## 2014-12-23 ENCOUNTER — Inpatient Hospital Stay (HOSPITAL_COMMUNITY): Payer: Medicare Other

## 2014-12-23 DIAGNOSIS — N179 Acute kidney failure, unspecified: Principal | ICD-10-CM

## 2014-12-23 DIAGNOSIS — R4182 Altered mental status, unspecified: Secondary | ICD-10-CM | POA: Diagnosis not present

## 2014-12-23 DIAGNOSIS — I959 Hypotension, unspecified: Secondary | ICD-10-CM

## 2014-12-23 DIAGNOSIS — G9340 Encephalopathy, unspecified: Secondary | ICD-10-CM

## 2014-12-23 DIAGNOSIS — E1129 Type 2 diabetes mellitus with other diabetic kidney complication: Secondary | ICD-10-CM | POA: Diagnosis not present

## 2014-12-23 DIAGNOSIS — N189 Chronic kidney disease, unspecified: Secondary | ICD-10-CM

## 2014-12-23 DIAGNOSIS — E872 Acidosis: Secondary | ICD-10-CM | POA: Diagnosis not present

## 2014-12-23 DIAGNOSIS — C069 Malignant neoplasm of mouth, unspecified: Secondary | ICD-10-CM

## 2014-12-23 LAB — COMPREHENSIVE METABOLIC PANEL
ALT: 16 U/L (ref 0–35)
AST: 26 U/L (ref 0–37)
Albumin: 3 g/dL — ABNORMAL LOW (ref 3.5–5.2)
Alkaline Phosphatase: 50 U/L (ref 39–117)
Anion gap: 18 — ABNORMAL HIGH (ref 5–15)
BUN: 136 mg/dL — ABNORMAL HIGH (ref 6–23)
CHLORIDE: 109 mmol/L (ref 96–112)
CO2: 10 mmol/L — CL (ref 19–32)
CREATININE: 11.99 mg/dL — AB (ref 0.50–1.10)
Calcium: 8.7 mg/dL (ref 8.4–10.5)
GFR, EST AFRICAN AMERICAN: 3 mL/min — AB (ref >90)
GFR, EST NON AFRICAN AMERICAN: 3 mL/min — AB (ref >90)
GLUCOSE: 98 mg/dL (ref 70–99)
Potassium: 4.7 mmol/L (ref 3.5–5.1)
Sodium: 137 mmol/L (ref 135–145)
Total Bilirubin: 0.5 mg/dL (ref 0.3–1.2)
Total Protein: 6.1 g/dL (ref 6.0–8.3)

## 2014-12-23 LAB — BLOOD GAS, ARTERIAL
ACID-BASE DEFICIT: 18 mmol/L — AB (ref 0.0–2.0)
Bicarbonate: 9.2 mEq/L — ABNORMAL LOW (ref 20.0–24.0)
Drawn by: 23534
FIO2: 0.21 %
O2 CONTENT: 21 L/min
O2 SAT: 90.6 %
PATIENT TEMPERATURE: 37
PO2 ART: 66.9 mmHg — AB (ref 80.0–100.0)
TCO2: 9 mmol/L (ref 0–100)
pCO2 arterial: 27.9 mmHg — ABNORMAL LOW (ref 35.0–45.0)
pH, Arterial: 7.145 — CL (ref 7.350–7.450)

## 2014-12-23 LAB — CBC
HCT: 32.8 % — ABNORMAL LOW (ref 36.0–46.0)
HEMOGLOBIN: 11.2 g/dL — AB (ref 12.0–15.0)
MCH: 29.2 pg (ref 26.0–34.0)
MCHC: 34.1 g/dL (ref 30.0–36.0)
MCV: 85.4 fL (ref 78.0–100.0)
Platelets: 321 10*3/uL (ref 150–400)
RBC: 3.84 MIL/uL — ABNORMAL LOW (ref 3.87–5.11)
RDW: 13.6 % (ref 11.5–15.5)
WBC: 13 10*3/uL — ABNORMAL HIGH (ref 4.0–10.5)

## 2014-12-23 LAB — CLOSTRIDIUM DIFFICILE BY PCR: Toxigenic C. Difficile by PCR: NEGATIVE

## 2014-12-23 LAB — MRSA PCR SCREENING: MRSA BY PCR: NEGATIVE

## 2014-12-23 MED ORDER — STERILE WATER FOR INJECTION IV SOLN
INTRAVENOUS | Status: DC
  Administered 2014-12-23 – 2014-12-25 (×6): via INTRAVENOUS
  Filled 2014-12-23 (×11): qty 9.7

## 2014-12-23 MED ORDER — SODIUM CHLORIDE 0.9 % IJ SOLN: 10.0000 mL | INTRAMUSCULAR | Status: DC | PRN

## 2014-12-23 MED ORDER — PHENYLEPHRINE HCL 10 MG/ML IJ SOLN
INTRAMUSCULAR | Status: AC
  Filled 2014-12-23: qty 1

## 2014-12-23 MED ORDER — PHENYLEPHRINE HCL 10 MG/ML IJ SOLN
30.0000 ug/min | INTRAMUSCULAR | Status: DC
  Administered 2014-12-23: 45 ug/min via INTRAVENOUS
  Administered 2014-12-24: 42.133 ug/min via INTRAVENOUS
  Administered 2014-12-25: 35 ug/min via INTRAVENOUS
  Administered 2014-12-26: 40 ug/min via INTRAVENOUS
  Filled 2014-12-23 (×3): qty 4

## 2014-12-23 MED ORDER — DEXTROSE 5 % IV SOLN
30.0000 ug/min | INTRAVENOUS | Status: DC
  Administered 2014-12-23 (×2): 30 ug/min via INTRAVENOUS
  Administered 2014-12-23: 40 ug/min via INTRAVENOUS
  Filled 2014-12-23 (×3): qty 1

## 2014-12-23 MED ORDER — BOOST / RESOURCE BREEZE PO LIQD
1.0000 | Freq: Three times a day (TID) | ORAL | Status: DC
  Administered 2014-12-23 – 2014-12-30 (×17): 1 via ORAL

## 2014-12-23 MED ORDER — FUROSEMIDE 10 MG/ML IJ SOLN
40.0000 mg | Freq: Two times a day (BID) | INTRAMUSCULAR | Status: DC
  Administered 2014-12-23 – 2014-12-27 (×10): 40 mg via INTRAVENOUS
  Filled 2014-12-23 (×10): qty 4

## 2014-12-23 MED ORDER — SODIUM CHLORIDE 0.9 % IJ SOLN
10.0000 mL | Freq: Two times a day (BID) | INTRAMUSCULAR | Status: DC
  Administered 2014-12-23: 10 mL
  Administered 2014-12-24: 40 mL
  Administered 2014-12-25 – 2014-12-30 (×8): 10 mL

## 2014-12-23 MED ORDER — ALBUTEROL SULFATE (2.5 MG/3ML) 0.083% IN NEBU: 2.5000 mg | INHALATION_SOLUTION | Freq: Four times a day (QID) | RESPIRATORY_TRACT | Status: DC | PRN

## 2014-12-23 MED ORDER — PHENYLEPHRINE HCL 10 MG/ML IJ SOLN
INTRAMUSCULAR | Status: AC
  Filled 2014-12-23: qty 4

## 2014-12-23 MED ORDER — PHENYLEPHRINE HCL 10 MG/ML IJ SOLN
INTRAMUSCULAR | Status: AC
Start: 2014-12-23 — End: 2014-12-23
  Filled 2014-12-23: qty 1

## 2014-12-23 NOTE — Progress Notes (Signed)
INITIAL NUTRITION ASSESSMENT  DOCUMENTATION CODES Per approved criteria  -Severe malnutrition in the context of chronic illness   INTERVENTION: Resource Breeze po TID, each supplement provides 250 kcal and 9 grams of protein   Recommend consider trial of appetite stimulant if poor po's persist  NUTRITION DIAGNOSIS: Inadequate oral intake related to anorexia as evidenced by diet hx and meal intake 0% .    Goal: Pt to meet >/= 90% of their estimated nutrition needs    Monitor:  Po intake, labs and wt trends   Reason for Assessment: Malnutrition Screen   74 y.o. female  ASSESSMENT: Pt has chronic renal failure stage 3-4, hx of oral cancer, and recent altered mental status.  Pt has ben experiencing anorexia past month per son. Pt denies any mouth pain and says she has not wanted to eat since her  treatment.  Son says she will eat a couple of bites and stop. He says, "I tried every kind of her favorite foods and she just won't eat". Her breakfast tray is here and untouched and she is refusing lunch (except she is agreeable to take sips of her tea).  Pt has 6% wt loss over past month which is significant and her intake has not been meeting at least 75% of her needs for >/= 1 month. She meets criteria for severe malnutriition in the context of chronic illness.  Labs:BUN-136, Creat. 11.99 high   Height: Ht Readings from Last 1 Encounters:  12/22/14 5\' 5"  (1.651 m)    Weight: Wt Readings from Last 1 Encounters:  12/23/14 179 lb 3.7 oz (81.3 kg)    Ideal Body Weight: 125# (56.8 kg)  Standard body weight (67 kg)  % Ideal Body Weight: 143%  Wt Readings from Last 10 Encounters:  12/23/14 179 lb 3.7 oz (81.3 kg)  11/27/14 143 lb (64.864 kg)  10/21/14 194 lb 0.1 oz (88 kg)  10/21/14 194 lb (87.998 kg)  10/18/14 186 lb (84.369 kg)  03/10/14 186 lb 4.6 oz (84.5 kg)  03/20/13 209 lb (94.802 kg)  11/27/12 209 lb 1.9 oz (94.856 kg)  10/09/12 211 lb (95.709 kg)  08/18/12 217 lb  3.2 oz (98.521 kg)    Usual Body Weight: 185-190#  % Usual Body Weight: 94%  BMI:  Body mass index is 29.83 kg/(m^2). overweight  Estimated Nutritional Needs: Kcal: 1800-2000  Protein: 65 gr Fluid: 24 hr UOP + 500 ml for insensible losses  Skin: no issues noted  Diet Order: Diet clear liquid  EDUCATION NEEDS: -No education needs identified at this time   Intake/Output Summary (Last 24 hours) at 12/23/14 1101 Last data filed at 12/23/14 0600  Gross per 24 hour  Intake 1268.75 ml  Output    235 ml  Net 1033.75 ml    Last BM: 12/23/14 colostomy  Labs:   Recent Labs Lab 12/22/14 1639 12/23/14 0457  NA 133* 137  K 4.8 4.7  CL 103 109  CO2 10* 10*  BUN 147* 136*  CREATININE 13.40* 11.99*  CALCIUM 9.0 8.7  GLUCOSE 120* 98    CBG (last 3)  No results for input(s): GLUCAP in the last 72 hours.  Scheduled Meds: . clopidogrel  75 mg Oral Daily  . enoxaparin (LOVENOX) injection  30 mg Subcutaneous Q24H  . escitalopram  20 mg Oral Daily  . feeding supplement (RESOURCE BREEZE)  1 Container Oral TID BM  . furosemide  40 mg Intravenous BID  . pantoprazole  40 mg Oral Daily  .  sodium chloride  3 mL Intravenous Q12H    Continuous Infusions: . phenylephrine (NEO-SYNEPHRINE) Adult infusion 30 mcg/min (12/23/14 0850)  .  sodium bicarbonate infusion 1/4 NS 1000 mL 135 mL/hr at 12/23/14 9826    Past Medical History  Diagnosis Date  . Anxiety   . Depression   . Hypertension   . GERD (gastroesophageal reflux disease)   . Hyperlipidemia   . Myocardial infarction   . Stroke     times 2  . Diabetes mellitus without complication     Past Surgical History  Procedure Laterality Date  . Abdominal hysterectomy    . Cholecystectomy    . Oophorectomy    . Colostomy    . Tonsillectomy    . Cataract extraction w/phaco Right 03/20/2013    Procedure: CATARACT EXTRACTION PHACO AND INTRAOCULAR LENS PLACEMENT (IOC);  Surgeon: Elta Guadeloupe T. Gershon Crane, MD;  Location: [facility];   Service: Ophthalmology;  Laterality: Right;  CDE:  6.29     Colman Cater MS,RD,CSG,LDN Office: [phone] Pager: [phone]

## 2014-12-23 NOTE — Care Management Note (Addendum)
    Page 1 of 2   12/30/2014     12:51:22 PM CARE MANAGEMENT NOTE 12/30/2014  Patient:  [name] [name], [name] [name]   Account Number:  [id number]  Date Initiated:  12/23/2014  Documentation initiated by:  Vladimir Creeks  Subjective/Objective Assessment:   Admitted with ARF, encephalophthy. pt is from home, and per chart has an aide. Will speak with pt when more alert.She also has oral CA and has been having chemo. Children active in her care     Action/Plan:   Home with [facility] vs SNF   Anticipated DC Date:  12/27/2014   Anticipated DC Plan:  SKILLED NURSING FACILITY  In-house referral  Clinical Social Worker      DC Forensic scientist  CM consult      PAC Choice  [place]   Choice offered to / List presented to:  C-1 Patient   DME arranged  3-N-1      DME agency  [place] arranged  HH-1 RN  [place].   Status of service:  Completed, signed off Medicare Important Message given?  YES (If response is "NO", the following Medicare IM given date fields will be blank) Date Medicare IM given:  12/27/2014 Medicare IM given by:  Jolene Provost Date Additional Medicare IM given:  12/30/2014 Additional Medicare IM given by:  JESSICA CHILDRESS  Discharge Disposition:  [place]  Per UR Regulation:  Reviewed for med. necessity/level of care/duration of stay  If discussed at [place] of Stay Meetings, dates discussed:    Comments:  12/30/2014 [place], RN, MSN ,CM Pt to dishcarge to SNF today. No CM needs. 12/27/2014 [place], RN, MSN, CM Pt is now agreeable to SNF for rehab. CSW aware and will arrange for placement. No further CM needs at this time. 12/26/2014 [place], RN, MNS, CM PT has recommended SNF for rehab, pt is refusing placmeent and plans to stay with daughter after discharge. Pt has choosen [facility] for [facility] and DME needs. Romualdo Bolk, of [facility] made aware of referral and will obtain pt information from chart. Ernestina Columbia, of [facility] notified of DME order and will obtrain pt information from chart and have 3 in 1 delivered to pt's room prior to discharge. Spoke with pt's daughter and will place daughters address on chart for [facility]. Will cont to follow. 12/25/2014 [place], RN, MSN, CM Pt's son's and grandson in room during assessment. Pt is from home, alone and has CAP aid 5 days a week, 4 hours a day. Pt has a cane, walker and wheelchair for PRN [place]. Pt has no needs prior to admission. Pt plans to return home with previous arrangment. PT consult pending. Will cont to follow for CM needs. 12/23/14 1630 Vladimir Creeks RN/CM

## 2014-12-23 NOTE — Progress Notes (Addendum)
   Hypotensive despite IV Fluids    ICD-9-CM ICD-10-CM   1. Acute renal failure, unspecified acute renal failure type 584.9 N17.9   2. Hypotension, unspecified hypotension type 458.9 I95.9   3. Altered mental status, unspecified altered mental status type 780.97 R41.82        Code Status Orders        Start     Ordered   12/22/14 2036  Do not attempt resuscitation (DNR)   Continuous    Question Answer Comment  In the event of cardiac or respiratory ARREST Do not call a "code blue"   In the event of cardiac or respiratory ARREST Do not perform Intubation, CPR, defibrillation or ACLS   In the event of cardiac or respiratory ARREST Use medication by any route, position, wound care, and other measures to relive pain and suffering. May use oxygen, suction and manual treatment of airway obstruction as needed for comfort.      12/22/14 2035    Advance Directive Documentation        Most Recent Value   Type of Advance Directive  Healthcare Power of Attorney   Pre-existing out of facility DNR order (yellow form or pink MOST form)     "MOST" Form in Place?       A Circulatroy shock No clear if ok or not for pressors  P Start neo via PIV; but RN to clarify with admitting MD if possible    Dr. Brand Males, M.D., [facility].C.P Pulmonary and Critical Care Medicine Staff Physician [facility] Pager: [phone], If no answer or between  15:00h - 7:00h: call [phone]  12/23/2014 1:38 AM

## 2014-12-23 NOTE — Progress Notes (Signed)
Dr Chase Caller updated on CO2=10 on CMP

## 2014-12-23 NOTE — Progress Notes (Signed)
[facility] PROGRESS NOTE  [name] [name] IRC:[id number] DOB: May 22, 1941 DOA: 12/22/2014 PCP: Glo Herring., MD  Assessment/Plan: 1. Acute renal failure superimposed on chronic kidney disease stage III. Precipitated by dehydration/decreased by mouth intake in the setting of ACE inhibitor use as well as Lasix. She is being followed by nephrology. Imaging did not indicate any hydronephrosis. She's been started on bicarbonate infusion as well as intravenous Lasix. She is currently oliguric. Need to follow urine output. Continue current treatments. 2. Metabolic acidosis. Likely related to renal failure. She is on a bicarbonate infusion. 3. Acute encephalopathy. Related to #1. Continue to follow. 4. Hypotension. Likely related to dehydration. Continue IV fluids. She currently requires vasopressor support. Wean off phenylephrine 5. Oral cancer. 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 Follow-up at [facility]  Code Status: DNR Family Communication: discussed with son at the bedside Disposition Plan: pending hospital course   Consultants:  Nephrology  Procedures:    Antibiotics:    HPI/Subjective: Patient is slow to respond. Denies any complaints  Objective: Filed Vitals:   12/23/14 0800  BP: 106/51  Pulse: 67  Temp:   Resp: 18    Intake/Output Summary (Last 24 hours) at 12/23/14 1134 Last data filed at 12/23/14 0600  Gross per 24 hour  Intake 1268.75 ml  Output    235 ml  Net 1033.75 ml   Filed Weights   12/22/14 1640 12/22/14 2254 12/23/14 0500  Weight: 63.504 kg (140 lb) 81.3 kg (179 lb 3.7 oz) 81.3 kg (179 lb 3.7 oz)    Exam:   General:  NAD, lethargic  Cardiovascular: S1, S2 RRR  Respiratory: CTA B  Abdomen: soft, nt, nd, bs+  Musculoskeletal: no edema b/l   Data Reviewed: Basic Metabolic Panel:  Recent Labs Lab 12/22/14 1639 12/23/14 0457  NA 133* 137  K 4.8 4.7  CL 103 109  CO2  10* 10*  GLUCOSE 120* 98  BUN 147* 136*  CREATININE 13.40* 11.99*  CALCIUM 9.0 8.7   Liver Function Tests:  Recent Labs Lab 12/22/14 1639 12/23/14 0457  AST 21 26  ALT 13 16  ALKPHOS 55 50  BILITOT 0.4 0.5  PROT 6.8 6.1  ALBUMIN 3.4* 3.0*   No results for input(s): LIPASE, AMYLASE in the last 168 hours. No results for input(s): AMMONIA in the last 168 hours. CBC:  Recent Labs Lab 12/22/14 1639 12/23/14 0457  WBC 11.8* 13.0*  NEUTROABS 9.7*  --   HGB 11.7* 11.2*  HCT 33.9* 32.8*  MCV 84.8 85.4  PLT 294 321   Cardiac Enzymes: No results for input(s): CKTOTAL, CKMB, CKMBINDEX, TROPONINI in the last 168 hours. BNP (last 3 results) No results for input(s): BNP in the last 8760 hours.  ProBNP (last 3 results) No results for input(s): PROBNP in the last 8760 hours.  CBG: No results for input(s): GLUCAP in the last 168 hours.  Recent Results (from the past 240 hour(s))  MRSA PCR Screening     Status: None   Collection Time: 12/22/14 11:40 PM  Result Value Ref Range Status   MRSA by PCR NEGATIVE NEGATIVE Final    Comment:        The GeneXpert MRSA Assay (FDA approved for NASAL specimens only), is one component of a comprehensive MRSA colonization surveillance program. It is not intended to diagnose MRSA infection nor to guide or monitor treatment for MRSA infections.      Studies: Ct Abdomen Pelvis Wo Contrast  12/22/2014   CLINICAL DATA:  Weight loss. Patient not eating or voiding for the past 3 days. Abdominal mass. Bowel obstruction.  EXAM: CT ABDOMEN AND PELVIS WITHOUT CONTRAST  TECHNIQUE: Multidetector CT imaging of the abdomen and pelvis was performed following the standard protocol without IV contrast.  COMPARISON:  04/26/2007.  FINDINGS: Musculoskeletal: No aggressive osseous lesions. No thoracolumbar compression fractures. Diffuse calcification of the L5-S1 disc. Fatty atrophy of the gluteal muscles and abdominal wall.  Lung Bases: Scattered areas of  atelectasis and scarring at the bases. Coronary artery atherosclerosis is present. If office based assessment of coronary risk factors has not been performed, it is now recommended.  Liver: Unenhanced CT was performed per clinician order. Lack of IV contrast limits sensitivity and specificity, especially for evaluation of abdominal/pelvic solid viscera. Grossly normal.  Spleen:  Normal.  Gallbladder:  Surgically absent.  Common bile duct:  Normal.  Pancreas:  Normal.  Adrenal glands:  Normal bilaterally.  Kidneys: Bilateral simple renal cysts. Ureters appear within normal limits. No calculi. No hydronephrosis.  Stomach: Gastric band is present around the cardia of the stomach. There is no obstruction. Fundus appears within normal limits.  Small bowel: Duodenum is normal. No small bowel obstruction. Small bowel is predominantly decompressed.  Colon: Normal appendix. Fluid levels are present within the colon which are abnormal but nonspecific. These are most commonly associated with enteric infection. LEFT lower quadrant end colostomy is present. Hartmann's pouch in the anatomic pelvis.  Pelvic Genitourinary: Hysterectomy. Urinary bladder appears normal.  Peritoneum: No free air.  No free fluid.  Vasculature: Mild atherosclerosis.  Body Wall: Atrophy.  Negative for hernia.  Midline scarring.  IMPRESSION: 1. Abnormal but nonspecific fluid levels within the colon, most commonly associated with enteric infection. 2. Negative for small bowel obstruction or mass. 3. Simple bilateral renal cysts. 4. Gastric band. 5. Atherosclerosis and coronary artery disease. 6. LEFT lower quadrant end colostomy and Hartman's pouch.   Electronically Signed   By: Dereck Ligas M.D.   On: 12/22/2014 20:14   Dg Chest Port 1 View  12/22/2014   CLINICAL DATA:  COPD. Atrial fibrillation confusion and weakness. Previous stroke.  EXAM: PORTABLE CHEST - 1 VIEW  COMPARISON:  10/21/2014  FINDINGS: The heart size and mediastinal contours are  within normal limits. Low lung volumes are noted, however both lungs are clear. No evidence of pleural effusion.  IMPRESSION: No active disease.   Electronically Signed   By: Earle Gell M.D.   On: 12/22/2014 17:15    Scheduled Meds: . clopidogrel  75 mg Oral Daily  . enoxaparin (LOVENOX) injection  30 mg Subcutaneous Q24H  . escitalopram  20 mg Oral Daily  . feeding supplement (RESOURCE BREEZE)  1 Container Oral TID BM  . furosemide  40 mg Intravenous BID  . pantoprazole  40 mg Oral Daily  . sodium chloride  3 mL Intravenous Q12H   Continuous Infusions: . phenylephrine (NEO-SYNEPHRINE) Adult infusion 30 mcg/min (12/23/14 0850)  .  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 If 7PM-7AM, please contact night-coverage at [email or web address], password [facility] 12/23/2014, 11:34 AM  LOS: 1 day

## 2014-12-23 NOTE — Care Management Utilization Note (Signed)
UR completed 

## 2014-12-23 NOTE — Consult Note (Signed)
Reason for Consult: Acute kidney injury superimposed on chronic Referring Physician: Dr.Memon  [name] [name] is an 74 y.o. female.  HPI: She is a patient who has history of chronic renal failure stage III B/stage IV chronic renal failure, history of oral cancer and hypertension presently was brought to the emergency room because of altered mental status. Since patient is presently a poor historian very difficult to get more information when her problem started. According to the note patient seems to have problem with her appetite and nausea sensation was started on treatment for her oral cancer. Presently patient seems to be more alert and denies any vomiting but she has some nausea. Patient also denies any difficulty in breathing.  Past Medical History  Diagnosis Date  . Anxiety   . Depression   . Hypertension   . GERD (gastroesophageal reflux disease)   . Hyperlipidemia   . Myocardial infarction   . Stroke     times 2  . Diabetes mellitus without complication     Past Surgical History  Procedure Laterality Date  . Abdominal hysterectomy    . Cholecystectomy    . Oophorectomy    . Colostomy    . Tonsillectomy    . Cataract extraction w/phaco Right 03/20/2013    Procedure: CATARACT EXTRACTION PHACO AND INTRAOCULAR LENS PLACEMENT (IOC);  Surgeon: Elta Guadeloupe T. Gershon Crane, MD;  Location: [facility];  Service: Ophthalmology;  Laterality: Right;  CDE:  6.29     Family History  Problem Relation Age of Onset  . Cancer Other   . Diabetes Other   . Abnormal EKG Other   . Arthritis Other   . Asthma Other   . Kidney disease Other     Social History:  reports that she quit smoking about 42 years ago. Her smoking use included Cigarettes. She started smoking about 58 years ago. She has a 3.75 pack-year smoking history. Her smokeless tobacco use includes Snuff. She reports that she does not drink alcohol or use illicit drugs.  Allergies:  Allergies  Allergen Reactions  . Ciprofloxacin  Anaphylaxis  . Aspirin Nausea And Vomiting  . Penicillins Nausea And Vomiting  . Propoxyphene N-Acetaminophen Nausea And Vomiting  . Sulfamethoxazole Rash    Medications: I have reviewed the patient's current medications.  Results for orders placed or performed during the hospital encounter of 12/22/14 (from the past 48 hour(s))  Comprehensive metabolic panel     Status: Abnormal   Collection Time: 12/22/14  4:39 PM  Result Value Ref Range   Sodium 133 (L) 135 - 145 mmol/L   Potassium 4.8 3.5 - 5.1 mmol/L   Chloride 103 96 - 112 mmol/L   CO2 10 (LL) 19 - 32 mmol/L    Comment: REPEATED TO VERIFY CRITICAL RESULT CALLED TO, READ BACK BY AND VERIFIED WITH: KEITH,J AT 5:26PM ON 12/22/14 BY MOSLEYJ    Glucose, Bld 120 (H) 70 - 99 mg/dL   BUN 147 (H) 6 - 23 mg/dL    Comment: RESULTS CONFIRMED BY MANUAL DILUTION   Creatinine, Ser 13.40 (H) 0.50 - 1.10 mg/dL   Calcium 9.0 8.4 - 10.5 mg/dL   Total Protein 6.8 6.0 - 8.3 g/dL   Albumin 3.4 (L) 3.5 - 5.2 g/dL   AST 21 0 - 37 U/L   ALT 13 0 - 35 U/L   Alkaline Phosphatase 55 39 - 117 U/L   Total Bilirubin 0.4 0.3 - 1.2 mg/dL   GFR calc non Af Amer 2 (L) >90 mL/min  GFR calc Af Amer 3 (L) >90 mL/min    Comment: (NOTE) The eGFR has been calculated using the CKD EPI equation. This calculation has not been validated in all clinical situations. eGFR's persistently <90 mL/min signify possible Chronic Kidney Disease.    Anion gap 20 (H) 5 - 15  CBC with Differential     Status: Abnormal   Collection Time: 12/22/14  4:39 PM  Result Value Ref Range   WBC 11.8 (H) 4.0 - 10.5 K/uL   RBC 4.00 3.87 - 5.11 MIL/uL   Hemoglobin 11.7 (L) 12.0 - 15.0 g/dL   HCT 33.9 (L) 36.0 - 46.0 %   MCV 84.8 78.0 - 100.0 fL   MCH 29.3 26.0 - 34.0 pg   MCHC 34.5 30.0 - 36.0 g/dL   RDW 13.4 11.5 - 15.5 %   Platelets 294 150 - 400 K/uL   Neutrophils Relative % 82 (H) 43 - 77 %   Neutro Abs 9.7 (H) 1.7 - 7.7 K/uL   Lymphocytes Relative 12 12 - 46 %   Lymphs  Abs 1.4 0.7 - 4.0 K/uL   Monocytes Relative 6 3 - 12 %   Monocytes Absolute 0.7 0.1 - 1.0 K/uL   Eosinophils Relative 0 0 - 5 %   Eosinophils Absolute 0.0 0.0 - 0.7 K/uL   Basophils Relative 0 0 - 1 %   Basophils Absolute 0.0 0.0 - 0.1 K/uL  I-Stat CG4 Lactic Acid, ED     Status: None   Collection Time: 12/22/14  4:46 PM  Result Value Ref Range   Lactic Acid, Venous 1.22 0.5 - 2.0 mmol/L  Urinalysis, Routine w reflex microscopic     Status: Abnormal   Collection Time: 12/22/14 10:34 PM  Result Value Ref Range   Color, Urine YELLOW YELLOW   APPearance HAZY (A) CLEAR   Specific Gravity, Urine >1.030 (H) 1.005 - 1.030   pH 5.0 5.0 - 8.0   Glucose, UA NEGATIVE NEGATIVE mg/dL   Hgb urine dipstick TRACE (A) NEGATIVE   Bilirubin Urine SMALL (A) NEGATIVE   Ketones, ur TRACE (A) NEGATIVE mg/dL   Protein, ur NEGATIVE NEGATIVE mg/dL   Urobilinogen, UA 0.2 0.0 - 1.0 mg/dL   Nitrite NEGATIVE NEGATIVE   Leukocytes, UA NEGATIVE NEGATIVE  Urine microscopic-add on     Status: Abnormal   Collection Time: 12/22/14 10:34 PM  Result Value Ref Range   Squamous Epithelial / LPF FEW (A) RARE   WBC, UA 3-6 <3 WBC/hpf   RBC / HPF 0-2 <3 RBC/hpf   Bacteria, UA FEW (A) RARE  MRSA PCR Screening     Status: None   Collection Time: 12/22/14 11:40 PM  Result Value Ref Range   MRSA by PCR NEGATIVE NEGATIVE    Comment:        The GeneXpert MRSA Assay (FDA approved for NASAL specimens only), is one component of a comprehensive MRSA colonization surveillance program. It is not intended to diagnose MRSA infection nor to guide or monitor treatment for MRSA infections.   Comprehensive metabolic panel     Status: Abnormal   Collection Time: 12/23/14  4:57 AM  Result Value Ref Range   Sodium 137 135 - 145 mmol/L   Potassium 4.7 3.5 - 5.1 mmol/L   Chloride 109 96 - 112 mmol/L   CO2 10 (LL) 19 - 32 mmol/L    Comment: CRITICAL RESULT CALLED TO, READ BACK BY AND VERIFIED WITH: LEE,B AT 5:50AM ON 12/23/14  BY Derrill Memo  Glucose, Bld 98 70 - 99 mg/dL   BUN 136 (H) 6 - 23 mg/dL   Creatinine, Ser 11.99 (H) 0.50 - 1.10 mg/dL   Calcium 8.7 8.4 - 10.5 mg/dL   Total Protein 6.1 6.0 - 8.3 g/dL   Albumin 3.0 (L) 3.5 - 5.2 g/dL   AST 26 0 - 37 U/L   ALT 16 0 - 35 U/L   Alkaline Phosphatase 50 39 - 117 U/L   Total Bilirubin 0.5 0.3 - 1.2 mg/dL   GFR calc non Af Amer 3 (L) >90 mL/min   GFR calc Af Amer 3 (L) >90 mL/min    Comment: (NOTE) The eGFR has been calculated using the CKD EPI equation. This calculation has not been validated in all clinical situations. eGFR's persistently <90 mL/min signify possible Chronic Kidney Disease.    Anion gap 18 (H) 5 - 15  CBC     Status: Abnormal   Collection Time: 12/23/14  4:57 AM  Result Value Ref Range   WBC 13.0 (H) 4.0 - 10.5 K/uL   RBC 3.84 (L) 3.87 - 5.11 MIL/uL   Hemoglobin 11.2 (L) 12.0 - 15.0 g/dL   HCT 32.8 (L) 36.0 - 46.0 %   MCV 85.4 78.0 - 100.0 fL   MCH 29.2 26.0 - 34.0 pg   MCHC 34.1 30.0 - 36.0 g/dL   RDW 13.6 11.5 - 15.5 %   Platelets 321 150 - 400 K/uL    Ct Abdomen Pelvis Wo Contrast  12/22/2014   CLINICAL DATA:  Weight loss. Patient not eating or voiding for the past 3 days. Abdominal mass. Bowel obstruction.  EXAM: CT ABDOMEN AND PELVIS WITHOUT CONTRAST  TECHNIQUE: Multidetector CT imaging of the abdomen and pelvis was performed following the standard protocol without IV contrast.  COMPARISON:  04/26/2007.  FINDINGS: Musculoskeletal: No aggressive osseous lesions. No thoracolumbar compression fractures. Diffuse calcification of the L5-S1 disc. Fatty atrophy of the gluteal muscles and abdominal wall.  Lung Bases: Scattered areas of atelectasis and scarring at the bases. Coronary artery atherosclerosis is present. If office based assessment of coronary risk factors has not been performed, it is now recommended.  Liver: Unenhanced CT was performed per clinician order. Lack of IV contrast limits sensitivity and specificity, especially  for evaluation of abdominal/pelvic solid viscera. Grossly normal.  Spleen:  Normal.  Gallbladder:  Surgically absent.  Common bile duct:  Normal.  Pancreas:  Normal.  Adrenal glands:  Normal bilaterally.  Kidneys: Bilateral simple renal cysts. Ureters appear within normal limits. No calculi. No hydronephrosis.  Stomach: Gastric band is present around the cardia of the stomach. There is no obstruction. Fundus appears within normal limits.  Small bowel: Duodenum is normal. No small bowel obstruction. Small bowel is predominantly decompressed.  Colon: Normal appendix. Fluid levels are present within the colon which are abnormal but nonspecific. These are most commonly associated with enteric infection. LEFT lower quadrant end colostomy is present. Hartmann's pouch in the anatomic pelvis.  Pelvic Genitourinary: Hysterectomy. Urinary bladder appears normal.  Peritoneum: No free air.  No free fluid.  Vasculature: Mild atherosclerosis.  Body Wall: Atrophy.  Negative for hernia.  Midline scarring.  IMPRESSION: 1. Abnormal but nonspecific fluid levels within the colon, most commonly associated with enteric infection. 2. Negative for small bowel obstruction or mass. 3. Simple bilateral renal cysts. 4. Gastric band. 5. Atherosclerosis and coronary artery disease. 6. LEFT lower quadrant end colostomy and Hartman's pouch.   Electronically Signed   By: Arvilla Market.D.  On: 12/22/2014 20:14   Dg Chest Port 1 View  12/22/2014   CLINICAL DATA:  COPD. Atrial fibrillation confusion and weakness. Previous stroke.  EXAM: PORTABLE CHEST - 1 VIEW  COMPARISON:  10/21/2014  FINDINGS: The heart size and mediastinal contours are within normal limits. Low lung volumes are noted, however both lungs are clear. No evidence of pleural effusion.  IMPRESSION: No active disease.   Electronically Signed   By: Earle Gell M.D.   On: 12/22/2014 17:15    Review of Systems  Constitutional: Negative for weight loss and malaise/fatigue.   Respiratory: Negative for cough and shortness of breath.   Cardiovascular: Negative for orthopnea and PND.  Gastrointestinal: Positive for nausea. Negative for vomiting.       Patient with poor appetite  Neurological:       AMS   Blood pressure 91/68, pulse 95, temperature 97.6 F (36.4 C), temperature source Axillary, resp. rate 16, height 5' 5"  (1.651 m), weight 81.3 kg (179 lb 3.7 oz), SpO2 96 %. Physical Exam  Constitutional: No distress.  Eyes: No scleral icterus.  Neck: No JVD present.  Cardiovascular: Normal rate and regular rhythm.   Respiratory: She has no wheezes. She has no rales.  GI: She exhibits no distension.  Musculoskeletal: She exhibits no edema.  Neurological: She is alert.    Assessment/Plan: Problem #1 acute kidney injury superimposed on chronic. The etiology could be multifactorial including prerenal syndrome associated with diuretics and loss of fluids through colostomy/lisinopril/ATN. Presently her BUN and creatinine seems to be improving. Problem #2 hypotension: Most likely from dehydration and antihypertensive medication.. Since patient has also elevated white blood cell count infection cannot be ruled out. However her urine and chest x-ray didn't show any sign of infection. Presently her blood pressure seems to be improving. Problem #3 low CO2 colon possibly metabolic. Problem #4 history of oral cancer: Presently patient is being treated at [facility]. Problem #5 anemia Problem #6 history of CVA Problem #7 history of MI Problem #8 history of GERD Problem #9 chronic renal failure: Possibly stage 3B/4. Her creatinine was 1.67 about a year ago and it was 2 about 2 months ago. Etiology could be secondary to hypertension/ischemic. Plan: Agree with hydration We'll change her IV fluid to 1/4l saline with 100 mEq of sodium bicarbonate at 135 cc/hr We'll check her basic metabolic panel in the morning. We will start her on Lasix 40 mg IV twice a day to  improve her urine output. We'll check ABG  Tyric Rodeheaver S 12/23/2014, 7:22 AM

## 2014-12-24 DIAGNOSIS — E43 Unspecified severe protein-calorie malnutrition: Secondary | ICD-10-CM | POA: Diagnosis present

## 2014-12-24 DIAGNOSIS — E872 Acidosis, unspecified: Secondary | ICD-10-CM | POA: Diagnosis present

## 2014-12-24 DIAGNOSIS — R571 Hypovolemic shock: Secondary | ICD-10-CM | POA: Diagnosis present

## 2014-12-24 LAB — CBC
HEMATOCRIT: 29.8 % — AB (ref 36.0–46.0)
Hemoglobin: 10.3 g/dL — ABNORMAL LOW (ref 12.0–15.0)
MCH: 28.9 pg (ref 26.0–34.0)
MCHC: 34.6 g/dL (ref 30.0–36.0)
MCV: 83.5 fL (ref 78.0–100.0)
Platelets: 269 10*3/uL (ref 150–400)
RBC: 3.57 MIL/uL — ABNORMAL LOW (ref 3.87–5.11)
RDW: 13.5 % (ref 11.5–15.5)
WBC: 10.2 10*3/uL (ref 4.0–10.5)

## 2014-12-24 LAB — BASIC METABOLIC PANEL
ANION GAP: 18 — AB (ref 5–15)
BUN: 120 mg/dL — ABNORMAL HIGH (ref 6–23)
CALCIUM: 8.4 mg/dL (ref 8.4–10.5)
CHLORIDE: 103 mmol/L (ref 96–112)
CO2: 16 mmol/L — AB (ref 19–32)
Creatinine, Ser: 9.97 mg/dL — ABNORMAL HIGH (ref 0.50–1.10)
GFR calc Af Amer: 4 mL/min — ABNORMAL LOW (ref >90)
GFR calc non Af Amer: 3 mL/min — ABNORMAL LOW (ref >90)
Glucose, Bld: 88 mg/dL (ref 70–99)
POTASSIUM: 3.3 mmol/L — AB (ref 3.5–5.1)
SODIUM: 137 mmol/L (ref 135–145)

## 2014-12-24 MED ORDER — POTASSIUM CHLORIDE CRYS ER 20 MEQ PO TBCR
40.0000 meq | EXTENDED_RELEASE_TABLET | Freq: Once | ORAL | Status: AC
  Administered 2014-12-24: 40 meq via ORAL
  Filled 2014-12-24: qty 2

## 2014-12-24 NOTE — Progress Notes (Signed)
[name] [name]  MRN: [id number]  DOB/AGE: [birth year] 74 y.o.  Primary Care Physician:FUSCO,LAWRENCE J., MD  Admit date: 12/22/2014  Chief Complaint:  Chief Complaint  Patient presents with  . Anorexia    S-Pt presented on  12/22/2014 with  Chief Complaint  Patient presents with  . Anorexia  .    Pt today feels better  Meds . clopidogrel  75 mg Oral Daily  . enoxaparin (LOVENOX) injection  30 mg Subcutaneous Q24H  . escitalopram  20 mg Oral Daily  . feeding supplement (RESOURCE BREEZE)  1 Container Oral TID BM  . furosemide  40 mg Intravenous BID  . pantoprazole  40 mg Oral Daily  . sodium chloride  10-40 mL Intracatheter Q12H  . sodium chloride  3 mL Intravenous Q12H        Physical Exam: Vital signs in last 24 hours: Temp:  [97.7 F (36.5 C)-98.4 F (36.9 C)] 97.7 F (36.5 C) (02/16 0800) Pulse Rate:  [32-89] 72 (02/16 0900) Resp:  [13-20] 19 (02/16 0900) BP: (78-114)/(32-68) 112/55 mmHg (02/16 0900) SpO2:  [90 %-100 %] 96 % (02/16 0900) Weight:  [194 lb 7.1 oz (88.2 kg)] 194 lb 7.1 oz (88.2 kg) (02/16 0500) Weight change: 54 lb 7.1 oz (24.696 kg) Last BM Date: 12/23/14  Intake/Output from previous day: 02/15 0701 - 02/16 0700 In: 940.1 [I.V.:940.1] Out: 875 [Urine:875] Total I/O In: 31.6 [I.V.:31.6] Out: 1275 [Urine:1275]   Physical Exam: General- pt is awake,alert, oriented to time place and person Resp- No acute REsp distress, CTA B/L NO Rhonchi CVS- S1S2 regular in rate and rhythm GIT- BS+, soft, NT, ND EXT- NO LE Edema, Cyanosis   Lab Results: CBC  Recent Labs  12/23/14 0457 12/24/14 0508  WBC 13.0* 10.2  HGB 11.2* 10.3*  HCT 32.8* 29.8*  PLT 321 269    BMET  Recent Labs  12/23/14 0457 12/24/14 0508  NA 137 137  K 4.7 3.3*  CL 109 103  CO2 10* 16*  GLUCOSE 98 88  BUN 136* 120*  CREATININE 11.99* 9.97*  CALCIUM 8.7 8.4   Trend Creat 2016 13.4=>11.9=>9.9.7 2015  1.5--2.0 2011   1.2--1.5   Trend  Bicarb 10=>16  MICRO Recent Results (from the past 240 hour(s))  MRSA PCR Screening     Status: None   Collection Time: 12/22/14 11:40 PM  Result Value Ref Range Status   MRSA by PCR NEGATIVE NEGATIVE Final    Comment:        The GeneXpert MRSA Assay (FDA approved for NASAL specimens only), is one component of a comprehensive MRSA colonization surveillance program. It is not intended to diagnose MRSA infection nor to guide or monitor treatment for MRSA infections.   Clostridium Difficile by PCR     Status: None   Collection Time: 12/23/14  1:50 PM  Result Value Ref Range Status   C difficile by pcr NEGATIVE NEGATIVE Final      Lab Results  Component Value Date   CALCIUM 8.4 12/24/2014   PHOS 2.6 03/28/2010               Impression: 1)Renal  AKI secondary to Prerenal/ATN               AKI sec to poor po intake + Hypotension + ACE               AKI on CKD               CKD stage 3/4.  CKD since 2011               CKD secondary to HTN                Progression of CKD marked with AKI                  2) CVS-admitted with Hypotension             On pressors          Being titrated down          Now better  3)Anemia HGb at goal (9--11)   4)CKD Mineral-Bone Disorder Phosphorus at goal. Calcium at goal.  5)ONcology- Hx of oral Ca Primary MD following  6)Electrolytes Normokalemic NOrmonatremic   7)Acid base Co2 not  at goal  Yesterday  AG 137-119=18 Delta AG 18-10=8 Delta Bicarb 24-10=14 Expected PCO2 10 times 1.5 + 8+-2=21-25 Pt CO2 is 28  High AG Acidosis NON AG Acidosis  Min Resp acidosis  Today 137-119=18 Delta  AG 18-10=8 Delta Bicarb 2416=8  Delta Bicarb now better. Pt On IV Bicarb     Plan:  Will continue current care     [place] S 12/24/2014, 2:29 PM

## 2014-12-24 NOTE — Progress Notes (Signed)
[facility] PROGRESS NOTE  [name] [name] GLO:[id number] DOB: Nov 08, 1941 DOA: 12/22/2014 PCP: Glo Herring., MD  Assessment/Plan: 1. Acute renal failure superimposed on chronic kidney disease stage III. Precipitated by dehydration/decreased by mouth intake in the setting of ACE inhibitor use as well as Lasix. She is being followed by nephrology. Imaging did not indicate any hydronephrosis. She is on bicarbonate infusion as well as intravenous Lasix. Need to follow urine output. Continue current treatments. Creatinine is slowly improving. 2. Metabolic acidosis. Likely related to renal failure. She is on a bicarbonate infusion with improvement in her serum bicarb. Continue to folow 3. Acute encephalopathy. Related to #1. Improving. Continue to follow. 4. Hypotension. Likely related to dehydration. Continue IV fluids. She currently requires vasopressor support. Wean off phenylephrine 5. Oral cancer. 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 Follow-up at [facility]  Code Status: DNR Family Communication: no family present Disposition Plan: pending hospital course   Consultants:  Nephrology  Procedures:    Antibiotics:    HPI/Subjective: Complains of headache. No other complaints.  Objective: Filed Vitals:   12/24/14 0900  BP: 112/55  Pulse: 72  Temp:   Resp: 19    Intake/Output Summary (Last 24 hours) at 12/24/14 0949 Last data filed at 12/24/14 0900  Gross per 24 hour  Intake 971.65 ml  Output    875 ml  Net  96.65 ml   Filed Weights   12/22/14 2254 12/23/14 0500 12/24/14 0500  Weight: 81.3 kg (179 lb 3.7 oz) 81.3 kg (179 lb 3.7 oz) 88.2 kg (194 lb 7.1 oz)    Exam:   General:  NAD, awake and alert  Cardiovascular: S1, S2 RRR  Respiratory: CTA B  Abdomen: soft, nt, nd, bs+  Musculoskeletal: no edema b/l   Data Reviewed: Basic Metabolic Panel:  Recent Labs Lab 12/22/14 1639 12/23/14 0457  12/24/14 0508  NA 133* 137 137  K 4.8 4.7 3.3*  CL 103 109 103  CO2 10* 10* 16*  GLUCOSE 120* 98 88  BUN 147* 136* 120*  CREATININE 13.40* 11.99* 9.97*  CALCIUM 9.0 8.7 8.4   Liver Function Tests:  Recent Labs Lab 12/22/14 1639 12/23/14 0457  AST 21 26  ALT 13 16  ALKPHOS 55 50  BILITOT 0.4 0.5  PROT 6.8 6.1  ALBUMIN 3.4* 3.0*   No results for input(s): LIPASE, AMYLASE in the last 168 hours. No results for input(s): AMMONIA in the last 168 hours. CBC:  Recent Labs Lab 12/22/14 1639 12/23/14 0457 12/24/14 0508  WBC 11.8* 13.0* 10.2  NEUTROABS 9.7*  --   --   HGB 11.7* 11.2* 10.3*  HCT 33.9* 32.8* 29.8*  MCV 84.8 85.4 83.5  PLT 294 321 269   Cardiac Enzymes: No results for input(s): CKTOTAL, CKMB, CKMBINDEX, TROPONINI in the last 168 hours. BNP (last 3 results) No results for input(s): BNP in the last 8760 hours.  ProBNP (last 3 results) No results for input(s): PROBNP in the last 8760 hours.  CBG: No results for input(s): GLUCAP in the last 168 hours.  Recent Results (from the past 240 hour(s))  MRSA PCR Screening     Status: None   Collection Time: 12/22/14 11:40 PM  Result Value Ref Range Status   MRSA by PCR NEGATIVE NEGATIVE Final    Comment:        The GeneXpert MRSA Assay (FDA approved for NASAL specimens only), is one component of a comprehensive MRSA colonization surveillance  program. It is not intended to diagnose MRSA infection nor to guide or monitor treatment for MRSA infections.   Clostridium Difficile by PCR     Status: None   Collection Time: 12/23/14  1:50 PM  Result Value Ref Range Status   C difficile by pcr NEGATIVE NEGATIVE Final     Studies: Ct Abdomen Pelvis Wo Contrast  12/22/2014   CLINICAL DATA:  Weight loss. Patient not eating or voiding for the past 3 days. Abdominal mass. Bowel obstruction.  EXAM: CT ABDOMEN AND PELVIS WITHOUT CONTRAST  TECHNIQUE: Multidetector CT imaging of the abdomen and pelvis was performed  following the standard protocol without IV contrast.  COMPARISON:  04/26/2007.  FINDINGS: Musculoskeletal: No aggressive osseous lesions. No thoracolumbar compression fractures. Diffuse calcification of the L5-S1 disc. Fatty atrophy of the gluteal muscles and abdominal wall.  Lung Bases: Scattered areas of atelectasis and scarring at the bases. Coronary artery atherosclerosis is present. If office based assessment of coronary risk factors has not been performed, it is now recommended.  Liver: Unenhanced CT was performed per clinician order. Lack of IV contrast limits sensitivity and specificity, especially for evaluation of abdominal/pelvic solid viscera. Grossly normal.  Spleen:  Normal.  Gallbladder:  Surgically absent.  Common bile duct:  Normal.  Pancreas:  Normal.  Adrenal glands:  Normal bilaterally.  Kidneys: Bilateral simple renal cysts. Ureters appear within normal limits. No calculi. No hydronephrosis.  Stomach: Gastric band is present around the cardia of the stomach. There is no obstruction. Fundus appears within normal limits.  Small bowel: Duodenum is normal. No small bowel obstruction. Small bowel is predominantly decompressed.  Colon: Normal appendix. Fluid levels are present within the colon which are abnormal but nonspecific. These are most commonly associated with enteric infection. LEFT lower quadrant end colostomy is present. Hartmann's pouch in the anatomic pelvis.  Pelvic Genitourinary: Hysterectomy. Urinary bladder appears normal.  Peritoneum: No free air.  No free fluid.  Vasculature: Mild atherosclerosis.  Body Wall: Atrophy.  Negative for hernia.  Midline scarring.  IMPRESSION: 1. Abnormal but nonspecific fluid levels within the colon, most commonly associated with enteric infection. 2. Negative for small bowel obstruction or mass. 3. Simple bilateral renal cysts. 4. Gastric band. 5. Atherosclerosis and coronary artery disease. 6. LEFT lower quadrant end colostomy and Hartman's pouch.    Electronically Signed   By: Dereck Ligas M.D.   On: 12/22/2014 20:14   Dg Chest Port 1 View  12/23/2014   CLINICAL DATA:  RIGHT-side PICC line placement, altered mental status, anorexia, hypertension, GERD, coronary artery disease post MI, prior stroke, former smoker  EXAM: PORTABLE CHEST - 1 VIEW  COMPARISON:  Portable exam 1734 hours compared 12/22/2014  FINDINGS: RIGHT arm PICC line with tip projecting over RIGHT atrium; recommend withdrawal 6 cm for positioning at approximately the cavoatrial junction.  Enlargement of cardiac silhouette.  Stable mediastinal contours and pulmonary vascularity for technique.  Increased RIGHT basilar atelectasis.  No definite infiltrate, pleural effusion or pneumothorax.  IMPRESSION: Enlargement of cardiac silhouette.  Increased RIGHT basilar atelectasis.  Tip of RIGHT arm PICC line projects over RIGHT atrium; recommend withdrawal 6 cm.  Line placement discussed with Juliette Mangle from PICC Service at time of dictation.   Electronically Signed   By: Lavonia Dana M.D.   On: 12/23/2014 18:00   Dg Chest Port 1 View  12/22/2014   CLINICAL DATA:  COPD. Atrial fibrillation confusion and weakness. Previous stroke.  EXAM: PORTABLE CHEST - 1 VIEW  COMPARISON:  10/21/2014  FINDINGS: The heart size and mediastinal contours are within normal limits. Low lung volumes are noted, however both lungs are clear. No evidence of pleural effusion.  IMPRESSION: No active disease.   Electronically Signed   By: Earle Gell M.D.   On: 12/22/2014 17:15    Scheduled Meds: . clopidogrel  75 mg Oral Daily  . enoxaparin (LOVENOX) injection  30 mg Subcutaneous Q24H  . escitalopram  20 mg Oral Daily  . feeding supplement (RESOURCE BREEZE)  1 Container Oral TID BM  . furosemide  40 mg Intravenous BID  . pantoprazole  40 mg Oral Daily  . sodium chloride  10-40 mL Intracatheter Q12H  . sodium chloride  3 mL Intravenous Q12H   Continuous Infusions: . phenylephrine (NEO-SYNEPHRINE) Adult infusion  42 mcg/min (12/24/14 0900)  .  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 If 7PM-7AM, please contact night-coverage at [email or web address], password [facility] 12/24/2014, 9:49 AM  LOS: 2 days

## 2014-12-24 NOTE — Progress Notes (Signed)
Patient up to chair at this time. Alert and oriented. Family at bedside. No complaints voiced.

## 2014-12-25 DIAGNOSIS — E43 Unspecified severe protein-calorie malnutrition: Secondary | ICD-10-CM

## 2014-12-25 LAB — BLOOD GAS, ARTERIAL
Acid-Base Excess: 1 mmol/L (ref 0.0–2.0)
Bicarbonate: 25 mEq/L — ABNORMAL HIGH (ref 20.0–24.0)
DRAWN BY: 317771
FIO2: 0.21 %
O2 SAT: 91.7 %
PATIENT TEMPERATURE: 37
PH ART: 7.415 (ref 7.350–7.450)
TCO2: 23 mmol/L (ref 0–100)
pCO2 arterial: 39.8 mmHg (ref 35.0–45.0)
pO2, Arterial: 60.9 mmHg — ABNORMAL LOW (ref 80.0–100.0)

## 2014-12-25 LAB — BASIC METABOLIC PANEL
Anion gap: 15 (ref 5–15)
BUN: 99 mg/dL — AB (ref 6–23)
CO2: 26 mmol/L (ref 19–32)
CREATININE: 7.24 mg/dL — AB (ref 0.50–1.10)
Calcium: 8.2 mg/dL — ABNORMAL LOW (ref 8.4–10.5)
Chloride: 95 mmol/L — ABNORMAL LOW (ref 96–112)
GFR, EST AFRICAN AMERICAN: 6 mL/min — AB (ref >90)
GFR, EST NON AFRICAN AMERICAN: 5 mL/min — AB (ref >90)
Glucose, Bld: 100 mg/dL — ABNORMAL HIGH (ref 70–99)
Potassium: 3.2 mmol/L — ABNORMAL LOW (ref 3.5–5.1)
Sodium: 136 mmol/L (ref 135–145)

## 2014-12-25 LAB — CBC
HEMATOCRIT: 28 % — AB (ref 36.0–46.0)
Hemoglobin: 9.6 g/dL — ABNORMAL LOW (ref 12.0–15.0)
MCH: 28.8 pg (ref 26.0–34.0)
MCHC: 34.3 g/dL (ref 30.0–36.0)
MCV: 84.1 fL (ref 78.0–100.0)
Platelets: 268 10*3/uL (ref 150–400)
RBC: 3.33 MIL/uL — AB (ref 3.87–5.11)
RDW: 13.4 % (ref 11.5–15.5)
WBC: 8.6 10*3/uL (ref 4.0–10.5)

## 2014-12-25 MED ORDER — SODIUM CHLORIDE 0.9 % IV SOLN: INTRAVENOUS | Status: DC

## 2014-12-25 MED ORDER — SODIUM CHLORIDE 0.45 % IV SOLN: INTRAVENOUS | Status: DC

## 2014-12-25 MED ORDER — HEPARIN SODIUM (PORCINE) 5000 UNIT/ML IJ SOLN
5000.0000 [IU] | Freq: Three times a day (TID) | INTRAMUSCULAR | Status: DC
  Administered 2014-12-25 – 2014-12-30 (×14): 5000 [IU] via SUBCUTANEOUS
  Filled 2014-12-25 (×12): qty 1

## 2014-12-25 MED ORDER — INSULIN ASPART 100 UNIT/ML ~~LOC~~ SOLN
0.0000 [IU] | Freq: Three times a day (TID) | SUBCUTANEOUS | Status: DC
  Administered 2014-12-25: 2 [IU] via SUBCUTANEOUS
  Administered 2014-12-26 – 2014-12-29 (×4): 1 [IU] via SUBCUTANEOUS

## 2014-12-25 MED ORDER — SODIUM CHLORIDE 0.45 % IV SOLN
INTRAVENOUS | Status: DC
  Administered 2014-12-25: 22:00:00 via INTRAVENOUS
  Filled 2014-12-25 (×2): qty 1000

## 2014-12-25 NOTE — Clinical Documentation Improvement (Signed)
Please specify diagnosis related to below supporting information if appropriate.   Possible Clinical Conditions?    Expected Acute Blood Loss Anemia  Acute Blood Loss Anemia  Acute on chronic blood loss anemia  Chronic blood loss anemia  Precipitous drop in Hematocrit  Other Condition________________  Cannot Clinically Determine    Supporting Information:  PROGRESS 12/24/2014   Impression:  3)Anemia HGb at goal (9--11)   Consult Note 12/23/2014   Assessment/Plan:  Problem #5 anemia   Patient's labs during this admission  Component     Latest Ref Rng 12/22/2014 12/23/2014 12/24/2014 12/25/2014  Hemoglobin     12.0 - 15.0 g/dL 11.7 (L) 11.2 (L) 10.3 (L) 9.6 (L)  HCT     36.0 - 46.0 % 33.9 (L) 32.8 (L) 29.8 (L) 28.0 (L)     Thank You, Serena Colonel ,RN Clinical Documentation Specialist:  [employer] [employer]

## 2014-12-25 NOTE — Evaluation (Signed)
Physical Therapy Evaluation Patient Details Name: NORIAH OSGOOD MRN: [id number] DOB: [birth year] Today's Date: 12/25/2014   History of Present Illness  This is a 74 year old lady who has had worsening altered mental status in the last few days. She has had progressive decline in her overall health in the last month since she had treatment for oral cancer approximately one month ago at [facility]. She was due to go to oncology again for further treatment but has become 2 weeks now.  She is now being referred for therapy.  Clinical Impression  Ms. Brickell has become very deconditioned.  She had been living by herself.  At this time I feel SNF would be the most appropriate placement for her, however, she declines stating that she will go and live with her daughter.  Her daughter is not present for me to speak with as far as if she feels that she would be able to provide the care that her mother would need at this time.      Follow Up Recommendations Home health PT (Pt refuses SNF states she is going to go live with her daughter. )    Equipment Recommendations  3in1 (PT)    Recommendations for Other Services OT consult     Precautions / Restrictions Precautions Precautions: Fall Restrictions Weight Bearing Restrictions: No      Mobility  Bed Mobility Overal bed mobility: Needs Assistance Bed Mobility: Sidelying to Sit;Supine to Sit   Sidelying to sit: Min assist Supine to sit: Min assist        Transfers Overall transfer level: Needs assistance Equipment used: Rolling walker (2 wheeled) Transfers: Sit to/from Stand;Lateral/Scoot Transfers Sit to Stand: Max assist (unable to come to full standing position)        Lateral/Scoot Transfers: Max assist (scooting from end of bed to center of bed to prepare for lying down.)    Ambulation/Gait  unable at this time                        Pertinent Vitals/Pain  none stated    Home Living Family/patient  expects to be discharged to:: Private residence Living Arrangements: Alone     Home Access: Stairs to enter   Technical brewer of Steps: 4 Home Layout: One level        Prior Function Level of Independence: Needs assistance   Gait / Transfers Assistance Needed: I with rolling walker  ADL's / Homemaking Assistance Needed: help came to house cleaning           Extremity/Trunk Assessment               Lower Extremity Assessment: RLE deficits/detail;LLE deficits/detail RLE Deficits / Details: hip mm generally 2/5; knee 3+/5 LLE Deficits / Details: hip mm generally 2/5; knee mm 3+/5              Exercises General Exercises - Lower Extremity Ankle Circles/Pumps: AAROM;Both;10 reps Quad Sets: Both;10 reps Long Arc Quad: AAROM;Both;10 reps Hip ABduction/ADduction: AAROM;Both;10 reps      Assessment/Plan    PT Assessment Patient needs continued PT services  PT Diagnosis Difficulty walking;Generalized weakness   PT Problem List Decreased strength;Decreased activity tolerance;Decreased mobility;Obesity  PT Treatment Interventions Gait training;Therapeutic exercise;Therapeutic activities;Functional mobility training   PT Goals (Current goals can be found in the Care Plan section)      Frequency Min 5X/week           End of Session Equipment Utilized  During Treatment: Gait belt Activity Tolerance: Patient limited by fatigue Patient left: in bed;with call bell/phone within reach;with bed alarm set           Time: 4446-1901 PT Time Calculation (min) (ACUTE ONLY): 33 min   Charges:   PT Evaluation $Initial PT Evaluation Tier I: 1 Procedure     PT G Codes:        Savana Spina,CINDY 01/01/15, 3:28 PM

## 2014-12-25 NOTE — Progress Notes (Signed)
[name] [name]  MRN: [id number]  DOB/AGE: [birth year] 74 y.o.  Primary Care Physician:FUSCO,LAWRENCE J., MD  Admit date: 12/22/2014  Chief Complaint:  Chief Complaint  Patient presents with  . Anorexia    S-Pt presented on  12/22/2014 with  Chief Complaint  Patient presents with  . Anorexia  .    Pt today feels better  Meds . clopidogrel  75 mg Oral Daily  . enoxaparin (LOVENOX) injection  30 mg Subcutaneous Q24H  . escitalopram  20 mg Oral Daily  . feeding supplement (RESOURCE BREEZE)  1 Container Oral TID BM  . furosemide  40 mg Intravenous BID  . pantoprazole  40 mg Oral Daily  . sodium chloride  10-40 mL Intracatheter Q12H  . sodium chloride  3 mL Intravenous Q12H        Physical Exam: Vital signs in last 24 hours: Temp:  [98.2 F (36.8 C)-98.6 F (37 C)] 98.6 F (37 C) (02/17 0741) Pulse Rate:  [57-89] 57 (02/17 0600) Resp:  [10-25] 10 (02/17 0600) BP: (74-114)/(38-83) 109/50 mmHg (02/17 0600) SpO2:  [94 %-99 %] 96 % (02/17 0600) Weight:  [192 lb 3.9 oz (87.2 kg)] 192 lb 3.9 oz (87.2 kg) (02/17 0500) Weight change: -2 lb 3.3 oz (-1 kg) Last BM Date: 12/25/14  Intake/Output from previous day: 02/16 0701 - 02/17 0700 In: 5485.7 [P.O.:237; I.V.:5248.7] Out: 3150 [Urine:2975; Stool:175] Total I/O In: 120 [P.O.:120] Out: -    Physical Exam: General- pt is awake,alert, oriented to time place and person Resp- No acute REsp distress, CTA B/L NO Rhonchi CVS- S1S2 regular in rate and rhythm GIT- BS+, soft, NT, ND EXT- NO LE Edema, Cyanosis   Lab Results: CBC  Recent Labs  12/24/14 0508 12/25/14 0428  WBC 10.2 8.6  HGB 10.3* 9.6*  HCT 29.8* 28.0*  PLT 269 268    BMET  Recent Labs  12/24/14 0508 12/25/14 0428  NA 137 136  K 3.3* 3.2*  CL 103 95*  CO2 16* 26  GLUCOSE 88 100*  BUN 120* 99*  CREATININE 9.97* 7.24*  CALCIUM 8.4 8.2*   Trend Creat 2016 13.4=>11.9=>9.9==> 7.24 2015  1.5--2.0 2011   1.2--1.5   Trend  Bicarb 10=>16=>26  MICRO Recent Results (from the past 240 hour(s))  MRSA PCR Screening     Status: None   Collection Time: 12/22/14 11:40 PM  Result Value Ref Range Status   MRSA by PCR NEGATIVE NEGATIVE Final    Comment:        The GeneXpert MRSA Assay (FDA approved for NASAL specimens only), is one component of a comprehensive MRSA colonization surveillance program. It is not intended to diagnose MRSA infection nor to guide or monitor treatment for MRSA infections.   Clostridium Difficile by PCR     Status: None   Collection Time: 12/23/14  1:50 PM  Result Value Ref Range Status   C difficile by pcr NEGATIVE NEGATIVE Final      Lab Results  Component Value Date   CALCIUM 8.2* 12/25/2014   PHOS 2.6 03/28/2010               Impression: 1)Renal  AKI secondary to Prerenal/ATN               AKI sec to poor po intake + Hypotension + ACE               AKI on CKD  CKD stage 3/4.               CKD since 2011               CKD secondary to HTN                Progression of CKD marked with AKI                  2) CVS-admitted with Hypotension             On pressors          Being titrated down          Now better  3)Anemia HGb at goal (9--11) Most likely sec to cancer/chronic ds  4)CKD Mineral-Bone Disorder Phosphorus at goal. Calcium at goal.  5)ONcology- Hx of oral Ca Primary MD following  6)Electrolytes Normokalemic NOrmonatremic   7)Acid base ABg shows pH better Co2 now  at goal      Plan:  Will suggest to change IVF 1/2 ns + 75 meq Bicarb alternating with NS and reduce rate to 190ml/hr Will  check anemia profile Will check 2 d echo Will follow bmet  BHUTANI,MANPREET S 12/25/2014, 9:46 AM

## 2014-12-25 NOTE — Progress Notes (Signed)
Pt refused SCDs. Educated as to their purpose. Pt stated that she has had them before and she doesn't want them again.

## 2014-12-25 NOTE — Progress Notes (Signed)
PROGRESS NOTE  [name] [name] AJO:[id number] DOB: 12/18/1940 DOA: 12/22/2014 PCP: [name] [name]., MD  Summary: 74 year old woman presented with several day history of acute encephalopathy and progressive decline over the last month, treated at [facility] for oral cancer approximately one month prior.  Assessment/Plan: 1. AKI superimposed on chronic kidney disease III-IV. Improving, non-oliguric. Suspected dehydration, prerenal, HTN, hypotension, complicated by Lasix and ACE inhibitor. Renal ultrasound without evidence of complicating features or hydronephrosis. 2. AG/non-AG mixed metabolic acidosis secondary to acute renal failure. Resolved. Secondary to acute renal failure. 3. Circulatory shock, hypotension. Etiology likely renal failure volume, dehydration. No evidence of infeciton. Blood pressure medications on hold. Started on vasopressor 2/15 for persistent hypotension. Lactic acid was normal. Urinalysis was negative, C. difficile PCR negative. No signs or symptoms of infection. 4. Acute encephalopathy. Thought to be secondary to acute renal failure. Resolved. 5. Hypoxemia, no h/o smoking; SpO2 91% on RA, asymptomatic, resp exam unremarkable. Suspect chronic.  6. Possible enteritis on CT on admission; asymptomatic, no diarrhea, cdiff negative. Doubt clinically significant. 7. Chronic normocytic anemia, suspect anemia of chronic disease secondary to chronic kidney disease and malignancy. 8. Hypokalemia. 9. 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 10. Diabetes mellitus, CBG stable. 11. Anxiety, depression 12. Severe malnutrition   Overall improving with good UOP and decreasing creatinine. Continues on vasopressor, hopefully can wean today.   BMP in the morning.  SSI  Discussed with son at bedside  Code Status: DNR DVT prophylaxis: heparin Family Communication:  Disposition Plan: pending   [name] Hodgkins, MD  [facility]  Pager [phone] If 7PM-7AM, please contact night-coverage at [email or web address], password [facility] 12/25/2014, 9:41 AM  LOS: 3 days   Consultants:  Nephrology  Procedures:    Antibiotics:    HPI/Subjective: Feels nauseous but not really vomiting, ate breakfast (cereal), ate dinner last night. No abd pain, no diarrhea. No pain.  Objective: Filed Vitals:   12/25/14 0500 12/25/14 0530 12/25/14 0600 12/25/14 0741  BP: 105/53 107/51 109/50   Pulse: 59 57 57   Temp:    98.6 F (37 C)  TempSrc:    Oral  Resp: 10 10 10    Height:      Weight: 87.2 kg (192 lb 3.9 oz)     SpO2: 95% 95% 96%     Intake/Output Summary (Last 24 hours) at 12/25/14 0941 Last data filed at 12/25/14 0900  Gross per 24 hour  Intake 5574.05 ml  Output   3150 ml  Net 2424.05 ml     Filed Weights   12/23/14 0500 12/24/14 0500 12/25/14 0500  Weight: 81.3 kg (179 lb 3.7 oz) 88.2 kg (194 lb 7.1 oz) 87.2 kg (192 lb 3.9 oz)    Exam:     Afebrile, vital signs are stable, continues on vasopressor General:  Appears calm and comfortable Eyes: PERRL, normal lids, irises ENT: grossly normal hearing Neck: no LAD, masses or thyromegaly Cardiovascular: RRR, 2/6 systolic murmur RUSB; no r/g. No LE edema. Telemetry: SR, no arrhythmias  Respiratory: CTA bilaterally, no w/r/r. Normal respiratory effort. Abdomen: soft, mild generalized tenderness, colostomy LLQ with soft brown stool Skin: no rash or induration seen on limited exam Musculoskeletal: grossly normal tone BUE/BLE; moves both LE Psychiatric: grossly normal mood and affect, speech fluent and appropriate Neurologic: grossly non-focal.  Data Reviewed:  Urine output 2975, +BM  ABG 7.41/39/60  BUN and creatinine rapidly improving now, CO2 has normalized, anion gap now normal  Pertinent data:  Labs  BUN 147 >> >> 99  Creatinine 13.4 >> >> 7.24  ABG 7.145/27/66 on admission   Imaging   Chest x-ray no acute  disease.   CT abdomen and pelvis abnormal nonspecific fluid levels within the colon commonly associated with enteric infection. No small bowel obstruction or mass. Left lower quadrant colostomy.  Other    Pending data:    Scheduled Meds: . clopidogrel  75 mg Oral Daily  . enoxaparin (LOVENOX) injection  30 mg Subcutaneous Q24H  . escitalopram  20 mg Oral Daily  . feeding supplement (RESOURCE BREEZE)  1 Container Oral TID BM  . furosemide  40 mg Intravenous BID  . pantoprazole  40 mg Oral Daily  . sodium chloride  10-40 mL Intracatheter Q12H  . sodium chloride  3 mL Intravenous Q12H   Continuous Infusions: . phenylephrine (NEO-SYNEPHRINE) Adult infusion 34.933 mcg/min (12/24/14 2200)  .  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

## 2014-12-26 DIAGNOSIS — Z79899 Other long term (current) drug therapy: Secondary | ICD-10-CM

## 2014-12-26 DIAGNOSIS — R0600 Dyspnea, unspecified: Secondary | ICD-10-CM

## 2014-12-26 LAB — BASIC METABOLIC PANEL
ANION GAP: 13 (ref 5–15)
BUN: 84 mg/dL — ABNORMAL HIGH (ref 6–23)
CO2: 33 mmol/L — ABNORMAL HIGH (ref 19–32)
Calcium: 7.9 mg/dL — ABNORMAL LOW (ref 8.4–10.5)
Chloride: 90 mmol/L — ABNORMAL LOW (ref 96–112)
Creatinine, Ser: 5.09 mg/dL — ABNORMAL HIGH (ref 0.50–1.10)
GFR calc Af Amer: 9 mL/min — ABNORMAL LOW (ref >90)
GFR calc non Af Amer: 8 mL/min — ABNORMAL LOW (ref >90)
Glucose, Bld: 165 mg/dL — ABNORMAL HIGH (ref 70–99)
Potassium: 2.8 mmol/L — ABNORMAL LOW (ref 3.5–5.1)
SODIUM: 136 mmol/L (ref 135–145)

## 2014-12-26 MED ORDER — POTASSIUM CHLORIDE CRYS ER 20 MEQ PO TBCR: 40.0000 meq | EXTENDED_RELEASE_TABLET | Freq: Two times a day (BID) | ORAL | Status: DC

## 2014-12-26 MED ORDER — POTASSIUM CHLORIDE 10 MEQ/100ML IV SOLN
10.0000 meq | INTRAVENOUS | Status: AC
  Administered 2014-12-26 (×3): 10 meq via INTRAVENOUS
  Filled 2014-12-26 (×3): qty 100

## 2014-12-26 MED ORDER — SODIUM CHLORIDE 0.45 % IV SOLN
INTRAVENOUS | Status: DC
  Administered 2014-12-26 – 2014-12-28 (×4): via INTRAVENOUS
  Filled 2014-12-26 (×11): qty 1000

## 2014-12-26 MED ORDER — POTASSIUM CHLORIDE CRYS ER 20 MEQ PO TBCR
40.0000 meq | EXTENDED_RELEASE_TABLET | Freq: Two times a day (BID) | ORAL | Status: DC
  Administered 2014-12-26: 40 meq via ORAL
  Filled 2014-12-26: qty 2

## 2014-12-26 NOTE — Progress Notes (Signed)
Subjective: Interval History: has no complaint of nausea or vomiting. He shouldn't to state that she is feeling much better. Presently she offers no complaints..  Objective: Vital signs in last 24 hours: Temp:  [97.9 F (36.6 C)-98.5 F (36.9 C)] 98.2 F (36.8 C) (02/18 0855) Pulse Rate:  [60-80] 65 (02/18 0700) Resp:  [11-25] 18 (02/18 0700) BP: (55-117)/(27-76) 112/54 mmHg (02/18 0700) SpO2:  [90 %-100 %] 99 % (02/18 0700) Weight:  [88 kg (194 lb 0.1 oz)] 88 kg (194 lb 0.1 oz) (02/18 0500) Weight change: 0.8 kg (1 lb 12.2 oz)  Intake/Output from previous day: 02/17 0701 - 02/18 0700 In: 4117.5 [P.O.:360; I.V.:3757.5] Out: 2525 [Urine:2425; Stool:100] Intake/Output this shift:    General appearance: alert, cooperative and no distress Resp: clear to auscultation bilaterally Cardio: regular rate and rhythm, S1, S2 normal, no murmur, click, rub or gallop GI: soft, non-tender; bowel sounds normal; no masses,  no organomegaly Extremities: extremities normal, atraumatic, no cyanosis or edema  Lab Results:  Recent Labs  12/24/14 0508 12/25/14 0428  WBC 10.2 8.6  HGB 10.3* 9.6*  HCT 29.8* 28.0*  PLT 269 268   BMET:  Recent Labs  12/25/14 0428 12/26/14 0515  NA 136 136  K 3.2* 2.8*  CL 95* 90*  CO2 26 33*  GLUCOSE 100* 165*  BUN 99* 84*  CREATININE 7.24* 5.09*  CALCIUM 8.2* 7.9*   No results for input(s): PTH in the last 72 hours. Iron Studies: No results for input(s): IRON, TIBC, TRANSFERRIN, FERRITIN in the last 72 hours.  Studies/Results: No results found.  I have reviewed the patient's current medications.  Assessment/Plan: Problem #1 acute kidney injury: Her BUN and creatinine is improving. Presently patient is nonoliguric. Problem #2 metabolic acidosis: Her CO2 is 33 high. Problem #3 hypokalemia most likely from diuretics. Problem #4 anemia: Her hemoglobin and hematocrit is low. Problem #5 hypotension: Her blood pressure still seems to be low normal  and fluctuating. Problem #6 history of oral cancer Problem #7 history of coronary artery disease Plan: DC IV fluid with sodium bicarbonate We'll start patient on half normal saline with 40 mEq of KCl at 125 mL per hour We'll check her basic metabolic panel in the morning.   LOS: 4 days   Domanique Huesman S 12/26/2014,10:30 AM

## 2014-12-26 NOTE — Care Management Utilization Note (Signed)
CARE MANAGEMENT NOTE 12/26/2014  Patient:  [name] [name], [name] [name]   Account Number:  [id number]  Date Initiated:  12/23/2014  Documentation initiated by:  Vladimir Creeks  Subjective/Objective Assessment:   Admitted with ARF, encephalophthy. pt is from home, and per chart has an aide. Will speak with pt when more alert.She also has oral CA and has been having chemo. Children active in her care     Action/Plan:   Home with [facility] vs SNF   Anticipated DC Date:  12/27/2014   Anticipated DC Plan:  [place]  CM consult      PAC Choice  [place]   Choice offered to / List presented to:  C-1 Patient   DME arranged  3-N-1      DME agency  [place] arranged  HH-1 RN  [place].   Status of service:  In process, will continue to follow Medicare Important Message given?   (If response is "NO", the following Medicare IM given date fields will be blank) Date Medicare IM given:   Medicare IM given by:   Date Additional Medicare IM given:   Additional Medicare IM given by:    Discharge Disposition:  [place]  Per UR Regulation:  Reviewed for med. necessity/level of care/duration of stay  If discussed at [place] of Stay Meetings, dates discussed:    Comments:  12/26/2014 [place], RN, MNS, CM PT has recommended SNF for rehab, pt is refusing placmeent and plans to stay with daughter after discharge. Pt has choosen [facility] for [facility] and DME needs. Romualdo Bolk, of [facility] made aware of referral and will obtain pt information from chart. Ernestina Columbia, of [facility] notified of DME order and will obtrain pt information from chart and have 3 in 1 delivered to pt's room prior to discharge. Spoke with pt's daughter and will place daughters address on chart for [facility]. Will cont to follow. 12/25/2014 [place], RN, MSN, CM Pt's son's and  grandson in room during assessment. Pt is from home, alone and has CAP aid 5 days a week, 4 hours a day. Pt has a cane, walker and wheelchair for PRN [place]. Pt has no needs prior to admission. Pt plans to return home with previous arrangment. PT consult pending. Will cont to follow for CM needs. 12/23/14 1630 Vladimir Creeks RN/CM

## 2014-12-26 NOTE — Progress Notes (Signed)
1.Pt is very weak when transferring to chair.  PT consult on 2/17 recommends HH PT, d/t pt refusing SNF care.  2. PT has very poor PO intake d/t pain from oral CA.   After discussing pt's PT and dietary needs with a group of family members today, several family members think that she would benefit greatly from rehab if that is an option.  Her son Juanell Fairly [phone] stated that all family members were available on Sunday to discuss placement at SNF.  Daughter Abigail Butts is offering to let her live with her after discharge, but multiple other family members expressed concerns about Wendy's ability to care for her mother.

## 2014-12-26 NOTE — Progress Notes (Signed)
NUTRITION FOLLOW UP  Intervention:   -Mechanical soft meats(gravy with meats and mashed potatoes) -Continue Resource Breeze TID providing 250 kcals and 9 grams protein -Magic Cup BID with meals providing 290 kcals and 9 g protein -Continue to monitor  Nutrition Dx:   Inadequate oral intake related to anorexia as evidenced by diet hx and meal intake 0%; ongoing.  Goal:   Pt to meet >/= 90% of their estimated nutrition needs; not met.  Monitor:   Pt PO intake  Assessment:   Pt has chronic renal failure stage 3-4, hx of oral cancer, and recent altered mental status.  Pt has ben experiencing anorexia past month per son. Pt denies any mouth pain and says she has not wanted to eat since her treatment. Son says she will eat a couple of bites and stop. He says, "I tried every kind of her favorite foods and she just won't eat". Her breakfast tray is here and untouched and she is refusing lunch (except she is agreeable to take sips of her tea).  Pt still eating 0-20% of meals but reports is drinking all of her Resource Breeze supplements. Pt reports she can't chew her foods such as her eggs and sausage this morning.  Says she would like things that she does not need to chew.  Dietetic Intern recommended foods she can get that she can easily swallow. Pt reports she can eat ice cream and had some last night, would like to have 2 magic cups a day.  Will send with lunch and dinner.  Height: Ht Readings from Last 1 Encounters:  12/22/14 _0  (1.651 m)    Weight Status:   Wt Readings from Last 1 Encounters:  12/26/14 194 lb 0.1 oz (88 kg)    Re-estimated needs:  Kcal: 1800-2000  Protein: 65 gr Fluid: 24 hr UOP + 500 ml for insensible losses  Skin: WDL  Diet Order: DIET SOFT   Intake/Output Summary (Last 24 hours) at 12/26/14 1107 Last data filed at 12/26/14 0600  Gross per 24 hour  Intake 2072.2 ml  Output   1750 ml  Net  322.2 ml    Last BM: 2/18   Labs:   Recent  Labs Lab 12/24/14 0508 12/25/14 0428 12/26/14 0515  NA 137 136 136  K 3.3* 3.2* 2.8*  CL 103 95* 90*  CO2 16* 26 33*  BUN 120* 99* 84*  CREATININE 9.97* 7.24* 5.09*  CALCIUM 8.4 8.2* 7.9*  GLUCOSE 88 100* 165*    CBG (last 3)  No results for input(s): GLUCAP in the last 72 hours.  Scheduled Meds: . clopidogrel  75 mg Oral Daily  . escitalopram  20 mg Oral Daily  . feeding supplement (RESOURCE BREEZE)  1 Container Oral TID BM  . furosemide  40 mg Intravenous BID  . heparin subcutaneous  5,000 Units Subcutaneous 3 times per day  . insulin aspart  0-9 Units Subcutaneous TID WC  . pantoprazole  40 mg Oral Daily  . potassium chloride  10 mEq Intravenous Q1 Hr x 3  . sodium chloride  10-40 mL Intracatheter Q12H  . sodium chloride  3 mL Intravenous Q12H    Continuous Infusions: . phenylephrine (NEO-SYNEPHRINE) Adult infusion 40 mcg/min (12/26/14 0733)  . sodium chloride 0.45 % with [place] MS Dietetic Intern Pager Number [phone]

## 2014-12-26 NOTE — Progress Notes (Signed)
PROGRESS NOTE  [name] [name] BWG:[id number] DOB: [birth year] DOA: 12/22/2014 PCP: Glo Herring., MD  Summary: 74 year old woman presented with several day history of acute encephalopathy and progressive decline over the last month, treated at [facility] for oral cancer approximately one month prior.  Assessment/Plan: 1. AKI superimposed on chronic kidney disease III-IV. Continues to improve. Suspected dehydration, prerenal, HTN, hypotension, complicated by Lasix and ACE inhibitor. Renal ultrasound without evidence of complicating features or hydronephrosis. 2. AG/non-AG mixed metabolic acidosis secondary to acute renal failure. Resolved. Secondary to acute renal failure. 3. Circulatory shock, hypotension. Etiology likely renal failure volume, dehydration. No evidence of infection. Blood pressure medications on hold. Started on vasopressor 2/15 for persistent hypotension. Lactic acid was normal. Urinalysis was negative, C. difficile PCR negative. No signs or symptoms of infection. Continue to wean vasopressor. Manual BP > automatic cuff. 4. Possible enteritis on CT on admission; asymptomatic, no diarrhea, cdiff negative. No evidence of clinical significance. 5. Chronic normocytic anemia, suspect anemia of chronic disease secondary to chronic kidney disease and malignancy. 6. Hypokalemia. Replete. 7. Diabetes mellitus, blood sugars remain stable. 8. 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  9. Acute encephalopathy. Thought secondary to acute renal failure. Resolved. 10. Hypoxemia, no h/o smoking; SpO2 91% on RA, asymptomatic, resp exam unremarkable. Suspect chronic.  11. Anxiety, depression 12. Severe malnutrition.   Appears stable, renal function slowly improving, excellent UOP  Plan wean vasopressors--hopefully off today. Use manual BP to assess.  Replete potassium. Check magnesium in the morning.  2-D  echocardiogram and anemia profile ordered by nephrology  Code Status: DNR DVT prophylaxis: heparin. Patient refuses SCDs. Family Communication: discussed with son yesterday at bedside Disposition Plan: Home health PT. Patient refuses skilled nursing facility. She is going to go live with her daughter. 3-in-1.  [name], MD  [facility]  Pager [phone] If 7PM-7AM, please contact night-coverage at [email or web address], password [facility] 12/26/2014, 8:29 AM  LOS: 4 days   Consultants:  Nephrology  Procedures:    Antibiotics:    HPI/Subjective: Patient refused SCDs.  Feels ok, breathing ok; some nausea; poor appetite.  Objective: Filed Vitals:   12/26/14 0530 12/26/14 0600 12/26/14 0630 12/26/14 0700  BP: 113/48 112/51 105/41 112/54  Pulse: 69 65 65 65  Temp:      TempSrc:      Resp: 17 16 12 18   Height:      Weight:      SpO2: 100% 100% 99% 99%    Intake/Output Summary (Last 24 hours) at 12/26/14 0829 Last data filed at 12/26/14 0600  Gross per 24 hour  Intake 4117.5 ml  Output   2525 ml  Net 1592.5 ml     Filed Weights   12/24/14 0500 12/25/14 0500 12/26/14 0500  Weight: 88.2 kg (194 lb 7.1 oz) 87.2 kg (192 lb 3.9 oz) 88 kg (194 lb 0.1 oz)    Exam:     Afebrile, vital signs stable on vasopressor; manual BP > automatic per RN. General:  Appears comfortable, calm. Cardiovascular: Regular rate and rhythm, no murmur, rub or gallop. No lower extremity edema. Respiratory: Clear to auscultation bilaterally, no wheezes, rales or rhonchi. Normal respiratory effort. Abdomen: soft, ntnd Skin: no rash or induration noted Musculoskeletal: grossly normal tone bilateral upper and lower extremities Psychiatric: grossly normal mood and affect, speech fluent and appropriate Neurologic: grossly non-focal.  Data Reviewed:  Urine output 2425, +BM  BUN and creatinine continue to improve. BUN  84, creatinine 5.09  Pertinent data:  Labs  BUN 147 >> >>  84  Creatinine 13.4 >> >> 5.09  ABG 7.145/27/66 on admission   Imaging   Chest x-ray no acute disease.   CT abdomen and pelvis abnormal nonspecific fluid levels within the colon commonly associated with enteric infection. No small bowel obstruction or mass. Left lower quadrant colostomy.  Other    Pending data:    Scheduled Meds: . clopidogrel  75 mg Oral Daily  . escitalopram  20 mg Oral Daily  . feeding supplement (RESOURCE BREEZE)  1 Container Oral TID BM  . furosemide  40 mg Intravenous BID  . heparin subcutaneous  5,000 Units Subcutaneous 3 times per day  . insulin aspart  0-9 Units Subcutaneous TID WC  . pantoprazole  40 mg Oral Daily  . sodium chloride  10-40 mL Intracatheter Q12H  . sodium chloride  3 mL Intravenous Q12H   Continuous Infusions: . sodium chloride 100 mL/hr at 12/25/14 2030  . phenylephrine (NEO-SYNEPHRINE) Adult infusion 40 mcg/min (12/26/14 0733)  . 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

## 2014-12-26 NOTE — Progress Notes (Signed)
Physical Therapy Treatment Patient Details Name: [name] [name] MRN: [id number] DOB: [birth year] Today's Date: 12/26/2014    History of Present Illness This is a 74 year old lady who has had worsening altered mental status in the last few days. She has had progressive decline in her overall health in the last month since she had treatment for oral cancer approximately one month ago at [facility]. She was due to go to oncology again for further treatment but has become 2 weeks now.  She is now being referred for therapy.    PT Comments    Pt was awake, alert and cooperative with PT.  She had been on 2 L O2 with O2 sats in the mid 90s.  She is not normally on supplemental O2 so it was removed and her O2 sats remained in the mid 90s on room air, even with exertion.  She was able to tolerate therapeutic exercise in the bed with 3/5 strength in all major muscle groups although it was noted that she has extreme tightness in both gastrocnemii.  She required mod assist to transfer to sitting, max assist to stand with a walker but was able to pivot transfer bed to chair with min assist using a walker.  Current d/c plan stands.  Follow Up Recommendations  Home health PT     Equipment Recommendations    [facility]   Recommendations for Other Services OT consult     Precautions / Restrictions Precautions Precautions: Fall Restrictions Weight Bearing Restrictions: No    Mobility  Bed Mobility Overal bed mobility: Needs Assistance       Supine to sit: Mod assist        Transfers Overall transfer level: Needs assistance Equipment used: Rolling walker (2 wheeled) Transfers: Sit to/from [employer] Transfers Sit to Stand: Max assist Stand pivot transfers: Min assist       General transfer comment: once pt achieved standing she was able to use a walker to pivot from bed to chair...this was very slow but she was fairly stable with transfer  Ambulation/Gait                  Stairs            Wheelchair Mobility    Modified Rankin (Stroke Patients Only)       Balance Overall balance assessment: No apparent balance deficits (not formally assessed)                                  Cognition Arousal/Alertness: Awake/alert Behavior During Therapy: WFL for tasks assessed/performed Overall Cognitive Status: Within Functional Limits for tasks assessed                      Exercises General Exercises - Lower Extremity Ankle Circles/Pumps: AROM;Both;10 reps;Supine (pt appears to have bilateral PF contractures) Quad Sets: AROM;Both;10 reps;Supine Gluteal Sets: AROM;Both;10 reps;Supine Short Arc Quad: AROM;Both;10 reps;Supine Heel Slides: AAROM;Both;AROM;10 reps;Supine Hip ABduction/ADduction: AROM;AAROM;Both;10 reps;Supine    General Comments        Pertinent Vitals/Pain Pain Assessment: No/denies pain    Home Living                      Prior Function            PT Goals (current goals can now be found in the care plan section) Progress towards PT goals: Progressing toward goals  Frequency       PT Plan Current plan remains appropriate    Co-evaluation             End of Session Equipment Utilized During Treatment: Gait belt Activity Tolerance: Patient tolerated treatment well Patient left: in chair;with call bell/phone within reach     Time: 0926-1003 PT Time Calculation (min) (ACUTE ONLY): 37 min  Charges:  $Therapeutic Exercise: 8-22 mins $Therapeutic Activity: 8-22 mins                    G Codes:      Demetrios Isaacs L 01/18/2015, 10:09 AM

## 2014-12-26 NOTE — Progress Notes (Signed)
  Echocardiogram 2D Echocardiogram has been performed.  Johny Chess 12/26/2014, 12:30 PM

## 2014-12-27 LAB — BASIC METABOLIC PANEL
Anion gap: 7 (ref 5–15)
BUN: 68 mg/dL — ABNORMAL HIGH (ref 6–23)
CHLORIDE: 94 mmol/L — AB (ref 96–112)
CO2: 32 mmol/L (ref 19–32)
Calcium: 7.7 mg/dL — ABNORMAL LOW (ref 8.4–10.5)
Creatinine, Ser: 3.8 mg/dL — ABNORMAL HIGH (ref 0.50–1.10)
GFR calc non Af Amer: 11 mL/min — ABNORMAL LOW (ref >90)
GFR, EST AFRICAN AMERICAN: 12 mL/min — AB (ref >90)
Glucose, Bld: 90 mg/dL (ref 70–99)
POTASSIUM: 3.7 mmol/L (ref 3.5–5.1)
Sodium: 133 mmol/L — ABNORMAL LOW (ref 135–145)

## 2014-12-27 LAB — MAGNESIUM: MAGNESIUM: 1.1 mg/dL — AB (ref 1.5–2.5)

## 2014-12-27 LAB — IRON AND TIBC
Iron: 18 ug/dL — ABNORMAL LOW (ref 42–145)
Saturation Ratios: 12 % — ABNORMAL LOW (ref 20–55)
TIBC: 156 ug/dL — AB (ref 250–470)
UIBC: 138 ug/dL (ref 125–400)

## 2014-12-27 LAB — FERRITIN: Ferritin: 332 ng/mL — ABNORMAL HIGH (ref 10–291)

## 2014-12-27 MED ORDER — CETYLPYRIDINIUM CHLORIDE 0.05 % MT LIQD
7.0000 mL | Freq: Two times a day (BID) | OROMUCOSAL | Status: DC
  Administered 2014-12-27 – 2014-12-30 (×7): 7 mL via OROMUCOSAL

## 2014-12-27 MED ORDER — MAGNESIUM SULFATE 2 GM/50ML IV SOLN
2.0000 g | Freq: Once | INTRAVENOUS | Status: AC
  Administered 2014-12-27: 2 g via INTRAVENOUS
  Filled 2014-12-27: qty 50

## 2014-12-27 MED ORDER — PHENYLEPHRINE HCL 10 MG/ML IJ SOLN
30.0000 ug/min | INTRAVENOUS | Status: DC
  Administered 2014-12-27: 30 ug/min via INTRAVENOUS
  Filled 2014-12-27 (×2): qty 1

## 2014-12-27 NOTE — Clinical Social Work Placement (Signed)
Clinical Social Work Department CLINICAL SOCIAL WORK PLACEMENT NOTE 12/27/2014  Patient:  [name] [name], [name] [name]  Account Number:  [id number] Admit date:  12/22/2014  Clinical Social Worker:  Benay Pike, LCSW  Date/time:  12/27/2014 03:48 PM  Clinical Social Work is seeking post-discharge placement for this patient at the following level of care:   [place]   (*CSW will update this form in Epic as items are completed)   12/27/2014  Patient/family provided with [place] Department of Clinical Social Work's list of facilities offering this level of care within the geographic area requested by the patient (or if unable, by the patient's family).  12/27/2014  Patient/family informed of their freedom to choose among providers that offer the needed level of care, that participate in Medicare, Medicaid or managed care program needed by the patient, have an available bed and are willing to accept the patient.  12/27/2014  Patient/family informed of [facility]' ownership interest in [facility], as well as of the fact that they are under no obligation to receive care at this facility.  PASARR submitted to EDS on 12/27/2014 PASARR number received on 12/27/2014  FL2 transmitted to all facilities in geographic area requested by pt/family on  12/27/2014 FL2 transmitted to all facilities within larger geographic area on   Patient informed that his/her managed care company has contracts with or will negotiate with  certain facilities, including the following:     Patient/family informed of bed offers received:  12/27/2014 Patient chooses bed at [facility] Physician recommends and patient chooses bed at    Patient to be transferred to  on   Patient to be transferred to facility by  Patient and family notified of transfer on  Name of family member notified:    The following physician request were entered in Epic:   Additional Comments:  Benay Pike,  [place]

## 2014-12-27 NOTE — Clinical Social Work Note (Signed)
Pt and pt's daughter accept bed at [facility]. Facility notified and can accept over weekend if ready.   Benay Pike, [place]

## 2014-12-27 NOTE — Clinical Social Work Placement (Signed)
Clinical Social Work Department CLINICAL SOCIAL WORK PLACEMENT NOTE 12/27/2014  Patient:  ZISSY, HAMLETT  Account Number:  [id number] Admit date:  12/22/2014  Clinical Social Worker:  Benay Pike, LCSW  Date/time:  12/27/2014 03:48 PM  Clinical Social Work is seeking post-discharge placement for this patient at the following level of care:   [place]   (*CSW will update this form in Epic as items are completed)   12/27/2014  Patient/family provided with [place] Department of Clinical Social Work's list of facilities offering this level of care within the geographic area requested by the patient (or if unable, by the patient's family).  12/27/2014  Patient/family informed of their freedom to choose among providers that offer the needed level of care, that participate in Medicare, Medicaid or managed care program needed by the patient, have an available bed and are willing to accept the patient.  12/27/2014  Patient/family informed of [facility]' ownership interest in [facility], as well as of the fact that they are under no obligation to receive care at this facility.  PASARR submitted to EDS on 12/27/2014 PASARR number received on 12/27/2014  FL2 transmitted to all facilities in geographic area requested by pt/family on  12/27/2014 FL2 transmitted to all facilities within larger geographic area on   Patient informed that his/her managed care company has contracts with or will negotiate with  certain facilities, including the following:     Patient/family informed of bed offers received:   Patient chooses bed at  Physician recommends and patient chooses bed at    Patient to be transferred to  on   Patient to be transferred to facility by  Patient and family notified of transfer on  Name of family member notified:    The following physician request were entered in Epic:   Additional Comments:  Benay Pike, [place]

## 2014-12-27 NOTE — Clinical Social Work Psychosocial (Signed)
Clinical Social Work Department BRIEF PSYCHOSOCIAL ASSESSMENT 12/27/2014  Patient:  [name] [name], [name] [name]     Account Number:  [id number]     Admit date:  12/22/2014  Clinical Social Worker:  Wyatt Haste  Date/Time:  12/27/2014 03:49 PM  Referred by:  CSW  Date Referred:  12/27/2014 Referred for  SNF Placement   Other Referral:   Interview type:  Patient Other interview type:   daughter- Abigail Butts    PSYCHOSOCIAL DATA Living Status:  ALONE Admitted from facility:   Level of care:   Primary support name:  Abigail Butts Primary support relationship to patient:  CHILD, ADULT Degree of support available:   supportive per pt    CURRENT CONCERNS Current Concerns  Post-Acute Placement   Other Concerns:    SOCIAL WORK ASSESSMENT / PLAN CSW met with pt at bedside pt alert and oriented to self, place, and situation, but confused to time. She states she lives alone and generally manages okay. Her daughter, Abigail Butts is her best support. At baseline, pt is independent and uses a walker. Pt admits to feeling very weak right now. PT evaluated pt several days ago and recommended SNF, but pt refused, stating she was going to live with her daughter. Pt has now changed her mind and is optimistic that rehab will be a good experience for her. She plans to move to her daughter's after completing rehab. CSW discussed this with Abigail Butts as well who is agreeable. Both are requesting [facility] as it is closest to [place]. SNF list provided.   Assessment/plan status:  Psychosocial Support/Ongoing Assessment of Needs Other assessment/ plan:   Information/referral to community resources:   SNF list    PATIENT'S/FAMILY'S RESPONSE TO PLAN OF CARE: Pt now agreeable to short term SNF. CSW will initiate bed search.       Benay Pike, [place]

## 2014-12-27 NOTE — Progress Notes (Signed)
PROGRESS NOTE  [name] [name] TGG:[id number] DOB: [birth year] DOA: 12/22/2014 PCP: Glo Herring., MD  Summary: 74 year old woman presented with several day history of acute encephalopathy and progressive decline over the last month, treated at [facility] for oral cancer approximately one month prior.  Assessment/Plan: 1. AKI superimposed on chronic kidney disease III-IV. Rapidly improving. Suspected dehydration, prerenal, HTN, hypotension, complicated by Lasix and ACE inhibitor. Renal ultrasound without evidence of complicating features or hydronephrosis. 2. AG/non-AG mixed metabolic acidosis secondary to acute renal failure. Resolved. Secondary to acute renal failure. 3. Circulatory shock, hypotension. Etiology likely renal failure volume, dehydration. No evidence of infection. Blood pressure medications on hold. Started on vasopressor 2/15 for persistent hypotension. Lactic acid normal. Urinalysis negative, C. difficile PCR negative. No signs or symptoms of infection. Continue to wean vasopressor. Manual BP > automatic cuff. 4. Possible enteritis on CT on admission; asymptomatic, no diarrhea, cdiff negative. Resolved. 5. Chronic normocytic anemia, suspect anemia of chronic disease secondary to chronic kidney disease and malignancy. 6. Hypomagnesemia.  7. Diabetes mellitus, stable. 8. 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  9. Acute encephalopathy. Resolved. Secondary to acute renal failure.  10. Hypoxemia, no h/o smoking; Asymptomatic, resp exam unremarkable. SPO2 normal without oxygen supplementation. 11. Anxiety, depression 12. Severe malnutrition.   Renal function continues to improve, excellent urine output.  Plan wean vasopressors. Use manual BP to assess. Suspect low BP is artifactual given clinical stability and appearance.  Replete magnesium.  Code Status: DNR DVT prophylaxis: heparin. Patient  refuses SCDs. Family Communication: Disposition Plan: Home health PT. Patient refuses skilled nursing facility. She is going to go live with her daughter. 3-in-1.  [name], MD  [facility]  Pager [phone] If 7PM-7AM, please contact night-coverage at [email or web address], password [facility] 12/27/2014, 8:43 AM  LOS: 5 days   Consultants:  Nephrology  PT: [facility]  Procedures:  2-D echocardiogram Study Conclusions  - Procedure narrative: Transthoracic echocardiography. Image quality was suboptimal, with poor valvular visualization. - Left ventricle: The cavity size was normal. Wall thickness was normal. Systolic function was vigorous. The estimated ejection fraction was in the range of 65% to 70%. Images were inadequate for LV wall motion assessment. - Aortic valve: Moderately calcified annulus. Mildly calcified leaflets. Mild aortic stenosis. Peak velocity 2.27 m/s. Mean gradient (S): 13 mm Hg. Valve area (VTI): 1.24 cm^2. Valve area (Vmax): 1.16 cm^2. Valve area (Vmean): 1.08 cm^2. - Mitral valve: Mildly calcified annulus. - Left atrium: The atrium was mildly dilated. - Right ventricle: The cavity size was normal. Wall thickness was mildly increased. - Tricuspid valve: There was moderate regurgitation. Moderately elevated pulmonary pressures (56 mmHg).  Antibiotics:    HPI/Subjective: PT recommended HH.  Feeling fine, no pain, no nausea. Tolerating diet.  Objective: Filed Vitals:   12/27/14 0500 12/27/14 0530 12/27/14 0600 12/27/14 0700  BP: 110/37 97/36 94/37  95/37  Pulse: 64 70 65 58  Temp:    98.2 F (36.8 C)  TempSrc:    Oral  Resp: 11 13 11 11   Height:      Weight: 87.9 kg (193 lb 12.6 oz)     SpO2: 95% 95% 94% 96%    Intake/Output Summary (Last 24 hours) at 12/27/14 0843 Last data filed at 12/27/14 0700  Gross per 24 hour  Intake 2382.86 ml  Output   3100 ml  Net -717.14 ml     Filed Weights   12/25/14 0500 12/26/14 0500  12/27/14 0500  Weight: 87.2 kg (192 lb 3.9 oz) 88 kg (194 lb 0.1 oz) 87.9 kg (193 lb 12.6 oz)    Exam:     Afebrile, vital signs stable on vasopressor General:  Appears calm and comfortable Eyes: appear grossly normal ENT: grossly normal hearing Cardiovascular: RRR, no m/r/g. No LE edema. Telemetry: SR, no arrhythmias  Respiratory: CTA bilaterally, no w/r/r. Normal respiratory effort. Abdomen: soft, ntnd, positive bowel sounds. Brown stool in colostomy bag Skin: no rash or induration seen  Musculoskeletal: grossly normal tone BUE/BLE. Moves all extremities to command Psychiatric: grossly normal mood and affect, speech fluent and appropriate Neurologic: grossly non-focal.  Data Reviewed:  Urine output 3100  BUN and creatinine continue to improve. BUN 68, creatinine 3.8  Mg2+ 1.1  Pertinent data:  Labs  BUN 147 >> >> 68  Creatinine 13.4 >> >> 3.8  ABG 7.145/27/66 on admission   Imaging   Chest x-ray no acute disease.   CT abdomen and pelvis abnormal nonspecific fluid levels within the colon commonly associated with enteric infection. No small bowel obstruction or mass. Left lower quadrant colostomy.  Other    Pending data:    Scheduled Meds: . clopidogrel  75 mg Oral Daily  . escitalopram  20 mg Oral Daily  . feeding supplement (RESOURCE BREEZE)  1 Container Oral TID BM  . furosemide  40 mg Intravenous BID  . heparin subcutaneous  5,000 Units Subcutaneous 3 times per day  . insulin aspart  0-9 Units Subcutaneous TID WC  . pantoprazole  40 mg Oral Daily  . sodium chloride  10-40 mL Intracatheter Q12H  . sodium chloride  3 mL Intravenous Q12H   Continuous Infusions: . phenylephrine (NEO-SYNEPHRINE) Adult infusion    . 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

## 2014-12-27 NOTE — Progress Notes (Signed)
Subjective: Interval History: patient offers no complaint. Denies any difficulty in breathing or orthopnea. Her appetite is good  Objective: Vital signs in last 24 hours: Temp:  [98 F (36.7 C)-98.5 F (36.9 C)] 98.2 F (36.8 C) (02/19 0700) Pulse Rate:  [58-90] 58 (02/19 0700) Resp:  [9-27] 11 (02/19 0700) BP: (73-122)/(34-78) 95/37 mmHg (02/19 0700) SpO2:  [88 %-100 %] 96 % (02/19 0700) Weight:  [87.9 kg (193 lb 12.6 oz)] 87.9 kg (193 lb 12.6 oz) (02/19 0500) Weight change: -0.1 kg (-3.5 oz)  Intake/Output from previous day: 02/18 0701 - 02/19 0700 In: 2382.9 [P.O.:360; I.V.:1722.9; IV Piggyback:300] Out: 3100 [Urine:3100] Intake/Output this shift:    General appearance: alert, cooperative and no distress Resp: clear to auscultation bilaterally Cardio: regular rate and rhythm, S1, S2 normal, no murmur, click, rub or gallop GI: soft, non-tender; bowel sounds normal; no masses,  no organomegaly Extremities: extremities normal, atraumatic, no cyanosis or edema  Lab Results:  Recent Labs  12/25/14 0428  WBC 8.6  HGB 9.6*  HCT 28.0*  PLT 268   BMET:   Recent Labs  12/26/14 0515 12/27/14 0519  NA 136 133*  K 2.8* 3.7  CL 90* 94*  CO2 33* 32  GLUCOSE 165* 90  BUN 84* 68*  CREATININE 5.09* 3.80*  CALCIUM 7.9* 7.7*   No results for input(s): PTH in the last 72 hours. Iron Studies: No results for input(s): IRON, TIBC, TRANSFERRIN, FERRITIN in the last 72 hours.  Studies/Results: No results found.  I have reviewed the patient's current medications.  Assessment/Plan: Problem #1 acute kidney injury: Her renal function continue to improve and patient is asymptomatic Problem #2 metabolic acidosis: Has corrected and not on sodium bicarbonate supplement Problem #3 hypokalemia most likely from diuretics. Her potassium has corrected Problem #4 anemia: Her hemoglobin and hematocrit is low. Problem #5 hypotension: Her blood pressure remains low but patient is  asymptomatic Problem #6 history of oral cancer Problem #7 history of coronary artery disease Plan:We'll decrease her ivf to  100 mL per hour We'll check her basic metabolic panel in the morning.   LOS: 5 days   Kenyette Gundy S 12/27/2014,8:57 AM

## 2014-12-27 NOTE — Care Management Utilization Note (Signed)
UR COMPLETED  

## 2014-12-28 DIAGNOSIS — D638 Anemia in other chronic diseases classified elsewhere: Secondary | ICD-10-CM

## 2014-12-28 LAB — GLUCOSE, CAPILLARY
GLUCOSE-CAPILLARY: 180 mg/dL — AB (ref 70–99)
GLUCOSE-CAPILLARY: 95 mg/dL (ref 70–99)
GLUCOSE-CAPILLARY: 85 mg/dL (ref 70–99)
GLUCOSE-CAPILLARY: 141 mg/dL — AB (ref 70–99)
GLUCOSE-CAPILLARY: 144 mg/dL — AB (ref 70–99)
Glucose-Capillary: 101 mg/dL — ABNORMAL HIGH (ref 70–99)
Glucose-Capillary: 165 mg/dL — ABNORMAL HIGH (ref 70–99)
Glucose-Capillary: 91 mg/dL (ref 70–99)
Glucose-Capillary: 149 mg/dL — ABNORMAL HIGH (ref 70–99)
Glucose-Capillary: 87 mg/dL (ref 70–99)
Glucose-Capillary: 112 mg/dL — ABNORMAL HIGH (ref 70–99)
Glucose-Capillary: 136 mg/dL — ABNORMAL HIGH (ref 70–99)
Glucose-Capillary: 81 mg/dL (ref 70–99)
Glucose-Capillary: 108 mg/dL — ABNORMAL HIGH (ref 70–99)

## 2014-12-28 LAB — BASIC METABOLIC PANEL
Anion gap: 7 (ref 5–15)
BUN: 59 mg/dL — AB (ref 6–23)
CALCIUM: 8.3 mg/dL — AB (ref 8.4–10.5)
CO2: 33 mmol/L — ABNORMAL HIGH (ref 19–32)
CREATININE: 3.02 mg/dL — AB (ref 0.50–1.10)
Chloride: 96 mmol/L (ref 96–112)
GFR, EST AFRICAN AMERICAN: 16 mL/min — AB (ref >90)
GFR, EST NON AFRICAN AMERICAN: 14 mL/min — AB (ref >90)
GLUCOSE: 84 mg/dL (ref 70–99)
POTASSIUM: 4.6 mmol/L (ref 3.5–5.1)
SODIUM: 136 mmol/L (ref 135–145)

## 2014-12-28 MED ORDER — SODIUM CHLORIDE 0.9 % IV SOLN
INTRAVENOUS | Status: DC
  Administered 2014-12-28 – 2014-12-30 (×3): via INTRAVENOUS

## 2014-12-28 NOTE — Progress Notes (Signed)
Subjective: Interval History: Patient complains of lack of sleep otherwise she feels okay. She denies any difficulty breathing and her appetite is good.  Objective: Vital signs in last 24 hours: Temp:  [98.3 F (36.8 C)-98.9 F (37.2 C)] 98.3 F (36.8 C) (02/20 0500) Pulse Rate:  [62-94] 69 (02/20 0700) Resp:  [12-29] 12 (02/20 0700) BP: (76-144)/(29-106) 92/55 mmHg (02/20 0700) SpO2:  [92 %-100 %] 96 % (02/20 0700) Weight:  [88.3 kg (194 lb 10.7 oz)] 88.3 kg (194 lb 10.7 oz) (02/20 0500) Weight change: 0.4 kg (14.1 oz)  Intake/Output from previous day: 02/19 0701 - 02/20 0700 In: 2415.5 [I.V.:2365.5; IV Piggyback:50] Out: 1925 [Urine:1925] Intake/Output this shift:    General appearance: alert, cooperative and no distress Resp: clear to auscultation bilaterally Cardio: regular rate and rhythm, S1, S2 normal, no murmur, click, rub or gallop GI: soft, non-tender; bowel sounds normal; no masses,  no organomegaly Extremities: extremities normal, atraumatic, no cyanosis or edema  Lab Results: No results for input(s): WBC, HGB, HCT, PLT in the last 72 hours. BMET:   Recent Labs  12/27/14 0519 12/28/14 0600  NA 133* 136  K 3.7 4.6  CL 94* 96  CO2 32 33*  GLUCOSE 90 84  BUN 68* 59*  CREATININE 3.80* 3.02*  CALCIUM 7.7* 8.3*   No results for input(s): PTH in the last 72 hours. Iron Studies:   Recent Labs  12/27/14 0519  IRON 18*  TIBC 156*  FERRITIN 332*    Studies/Results: No results found.  I have reviewed the patient's current medications.  Assessment/Plan: Problem #1 acute kidney injury: Her renal function continued to improve. Problem #2 metabolic acidosis: Has corrected  Problem #3 hypokalemia: Patient on potassium supplement and her potassium has corrected. Problem #4 anemia: Her hemoglobin and hematocrit is low. Most likely secondary to chronic disease. Problem #5 hypotension: Her blood pressure remains low but patient is asymptomatic Problem #6  history of oral cancer Problem #7 history of coronary artery disease Plan:We'll DC Lasix Change IV fluid to normal saline at 75 mL per hour We'll check her basic metabolic panel in the morning.   LOS: 6 days   Tricha Ruggirello S 12/28/2014,7:58 AM

## 2014-12-28 NOTE — Progress Notes (Signed)
PROGRESS NOTE  [name] [name] KTG:[id number] DOB: 14-Dec-1940 DOA: 12/22/2014 PCP: Glo Herring., MD  Summary: 74 year old woman presented with several day history of acute encephalopathy and progressive decline over the last month, treated at [facility] for oral cancer approximately one month prior. Admitted for profound acute renal failure, metabolic acidosis, hypotension.  Assessment/Plan: 1. AKI superimposed on chronic kidney disease III-IV. Continues to improve. Good UOP. Suspected dehydration, prerenal, HTN, hypotension, complicated by Lasix and ACE inhibitor. Renal ultrasound without evidence of complicating features or hydronephrosis. 2. AG/non-AG mixed metabolic acidosis secondary to acute renal failure. Resolved. Secondary to acute renal failure. 3. Circulatory shock, hypotension. Etiology likely renal failure volume, dehydration. No evidence of infection. Blood pressure medications on hold. Started on vasopressor 2/15 for persistent hypotension, now off vasopressors. Lactic acid normal. Urinalysis negative, C. difficile PCR negative. No signs or symptoms of infection. Manual BP > automatic cuff. 4. Chronic normocytic anemia, suspect anemia of chronic disease secondary to chronic kidney disease and malignancy. 5. Possible enteritis on CT on admission; asymptomatic, no diarrhea, cdiff negative. Resolved. 6. Diabetes mellitus, remains stable. 7. Acute encephalopathy. Resolved. Secondary to acute renal failure.  8. Hypoxemia, no h/o smoking; Asymptomatic, resp exam unremarkable. SPO2 normal without oxygen supplementation. 9. 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  10. Anxiety, depression 11. Severe malnutrition.   Continues to improve, good UOP, BUN and creatinine trending down, off vasopressors.  Continue to follow UOP, BMP, BP; check BP manual  Up to chair  Appreciate nephrology  Possibly to SNF next  48 hours and floor in 24 hours if BP stable off vasopressor.  Code Status: DNR DVT prophylaxis: heparin. Patient refuses SCDs. Family Communication: none present Disposition Plan: SNF [facility])  [name], MD  [facility]  Pager [phone] If 7PM-7AM, please contact night-coverage at [email or web address], password [email or web address] 12/28/2014, 8:21 AM  LOS: 6 days   Consultants:  Nephrology  PT: [facility]  Procedures:  2-D echocardiogram Study Conclusions  - Procedure narrative: Transthoracic echocardiography. Image quality was suboptimal, with poor valvular visualization. - Left ventricle: The cavity size was normal. Wall thickness was normal. Systolic function was vigorous. The estimated ejection fraction was in the range of 65% to 70%. Images were inadequate for LV wall motion assessment. - Aortic valve: Moderately calcified annulus. Mildly calcified leaflets. Mild aortic stenosis. Peak velocity 2.27 m/s. Mean gradient (S): 13 mm Hg. Valve area (VTI): 1.24 cm^2. Valve area (Vmax): 1.16 cm^2. Valve area (Vmean): 1.08 cm^2. - Mitral valve: Mildly calcified annulus. - Left atrium: The atrium was mildly dilated. - Right ventricle: The cavity size was normal. Wall thickness was mildly increased. - Tricuspid valve: There was moderate regurgitation. Moderately elevated pulmonary pressures (56 mmHg).  Antibiotics:    HPI/Subjective: Nephrology has stopped Lasix.  Feels ok, eating well, no n/v/abd pain.  Objective: Filed Vitals:   12/28/14 0400 12/28/14 0500 12/28/14 0600 12/28/14 0700  BP: 87/32 94/44 76/29  92/55  Pulse: 70 66 73 69  Temp:  98.3 F (36.8 C)    TempSrc:  Oral    Resp: 21 15 17 12   Height:      Weight:  88.3 kg (194 lb 10.7 oz)    SpO2: 96% 97% 96% 96%    Intake/Output Summary (Last 24 hours) at 12/28/14 0821 Last data filed at 12/28/14 0520  Gross per 24 hour  Intake 2415.52 ml  Output   1925 ml  Net 490.52 ml  Filed  Weights   12/26/14 0500 12/27/14 0500 12/28/14 0500  Weight: 88 kg (194 lb 0.1 oz) 87.9 kg (193 lb 12.6 oz) 88.3 kg (194 lb 10.7 oz)    Exam:     Afebrile, VSS, SBP 80-90s off vasopressor. General:  Appears comfortable, calm. Eyes: PERRL, normal lids Cardiovascular: Regular rate and rhythm, no murmur, rub or gallop. No lower extremity edema. Telemetry: Sinus rhythm, no arrhythmias  Respiratory: Clear to auscultation bilaterally, no wheezes, rales or rhonchi. Normal respiratory effort. Abdomen: soft, ntnd Skin: no rash or induration noted Musculoskeletal: grossly normal tone bilateral upper and lower extremities; moves both legs to command Psychiatric: grossly normal mood and affect, speech fluent and appropriate Neurologic: grossly non-focal.  Data Reviewed:  Urine output 1925  BUN and creatinine continue to improve.   Potassium normal  Pertinent data:  Labs  CBG stable  BUN 147 >> >> 59  Creatinine 13.4 >> >> 3.02  ABG 7.145/27/66 on admission   Imaging   Chest x-ray no acute disease.   CT abdomen and pelvis abnormal nonspecific fluid levels within the colon commonly associated with enteric infection. No small bowel obstruction or mass. Left lower quadrant colostomy.  Other    Pending data:    Scheduled Meds: . antiseptic oral rinse  7 mL Mouth Rinse BID  . clopidogrel  75 mg Oral Daily  . escitalopram  20 mg Oral Daily  . feeding supplement (RESOURCE BREEZE)  1 Container Oral TID BM  . heparin subcutaneous  5,000 Units Subcutaneous 3 times per day  . insulin aspart  0-9 Units Subcutaneous TID WC  . pantoprazole  40 mg Oral Daily  . sodium chloride  10-40 mL Intracatheter Q12H  . sodium chloride  3 mL Intravenous Q12H   Continuous Infusions: . sodium chloride    . phenylephrine (NEO-SYNEPHRINE) Adult infusion Stopped (12/27/14 1635)    Principal Problem:   Acute on chronic renal failure Active Problems:   Hypertension   Hyperlipidemia    Encephalopathy acute   Oral-mouth cancer s/p laser excision of verrucus SCC of oral cavity and lips 10/11/14   Protein-calorie malnutrition, severe   Metabolic acidosis   Hypotension   Time spent 20 minutes

## 2014-12-29 LAB — BASIC METABOLIC PANEL
Anion gap: 6 (ref 5–15)
BUN: 48 mg/dL — ABNORMAL HIGH (ref 6–23)
CHLORIDE: 101 mmol/L (ref 96–112)
CO2: 31 mmol/L (ref 19–32)
CREATININE: 2.62 mg/dL — AB (ref 0.50–1.10)
Calcium: 8.2 mg/dL — ABNORMAL LOW (ref 8.4–10.5)
GFR calc Af Amer: 20 mL/min — ABNORMAL LOW (ref >90)
GFR calc non Af Amer: 17 mL/min — ABNORMAL LOW (ref >90)
GLUCOSE: 86 mg/dL (ref 70–99)
POTASSIUM: 4.3 mmol/L (ref 3.5–5.1)
Sodium: 138 mmol/L (ref 135–145)

## 2014-12-29 LAB — GLUCOSE, CAPILLARY
GLUCOSE-CAPILLARY: 163 mg/dL — AB (ref 70–99)
GLUCOSE-CAPILLARY: 134 mg/dL — AB (ref 70–99)
Glucose-Capillary: 78 mg/dL (ref 70–99)
Glucose-Capillary: 109 mg/dL — ABNORMAL HIGH (ref 70–99)
Glucose-Capillary: 96 mg/dL (ref 70–99)

## 2014-12-29 LAB — CORTISOL: Cortisol, Plasma: 14.8 ug/dL

## 2014-12-29 NOTE — Progress Notes (Addendum)
Subjective: Interval History: The patient continued to improve. She stated that she slept very well last night. Presently her appetite is good and no difficulty breathing.  Objective: Vital signs in last 24 hours: Temp:  [97.1 F (36.2 C)-98.1 F (36.7 C)] 97.5 F (36.4 C) (02/21 0400) Pulse Rate:  [62-82] 71 (02/21 0700) Resp:  [10-26] 12 (02/21 0700) BP: (78-116)/(36-64) 101/46 mmHg (02/21 0700) SpO2:  [95 %-100 %] 97 % (02/21 0700) Weight:  [91.5 kg (201 lb 11.5 oz)] 91.5 kg (201 lb 11.5 oz) (02/21 0500) Weight change: 3.2 kg (7 lb 0.9 oz)  Intake/Output from previous day: 02/20 0701 - 02/21 0700 In: 810 [I.V.:810] Out: 1175 [Urine:1175] Intake/Output this shift:    General appearance: alert, cooperative and no distress Resp: clear to auscultation bilaterally Cardio: regular rate and rhythm, S1, S2 normal, no murmur, click, rub or gallop GI: soft, non-tender; bowel sounds normal; no masses,  no organomegaly Extremities: extremities normal, atraumatic, no cyanosis or edema  Lab Results: No results for input(s): WBC, HGB, HCT, PLT in the last 72 hours. BMET:   Recent Labs  12/28/14 0600 12/29/14 0520  NA 136 138  K 4.6 4.3  CL 96 101  CO2 33* 31  GLUCOSE 84 86  BUN 59* 48*  CREATININE 3.02* 2.62*  CALCIUM 8.3* 8.2*   No results for input(s): PTH in the last 72 hours. Iron Studies:   Recent Labs  12/27/14 0519  IRON 18*  TIBC 156*  FERRITIN 332*    Studies/Results: No results found.  I have reviewed the patient's current medications.  Assessment/Plan: Problem #1 acute kidney injury superimposed on chronic renal failure: Prerenal versus ATN. Presently she is nonoliguric. Renal function progressively improved. Patient at this moment does not have any nausea vomiting. Presently her BUN and creatinine still above her baseline. Problem #2 metabolic acidosis: Secondary to renal failure. Her metabolic acidosis has resolved. Problem #3 hypokalemia: Patient is  off potassium supplement. Her potassium is remains normal. Problem #4 anemia: Her hemoglobin and hematocrit is low. Most likely secondary to chronic disease. Problem #5 hypotension: Etiology at this moment not clear. Patient does not have any sign of infection and also she is hydrated very well. Her blood pressure remains low normal with occasional decline below systolic blood pressure of 90. Patient however is asymptomatic. Problem #6 history of oral cancer Problem #7 history of coronary artery disease Problem #8 chronic renal failure: Stage III. Plan:We'll DC Lasix We'll continue his present management. We'll check her basic metabolic panel in the morning.   LOS: 7 days   Drenda Sobecki S 12/29/2014,8:24 AM

## 2014-12-29 NOTE — Progress Notes (Signed)
PROGRESS NOTE  [name] [name] YDX:[id number] DOB: 1941/03/26 DOA: 12/22/2014 PCP: Glo Herring., MD  Summary: 74 year old woman presented with several day history of acute encephalopathy and progressive decline over the last month, treated at [facility] for oral cancer approximately one month prior. Admitted for profound acute renal failure, metabolic acidosis, hypotension. Seen in consultation with nephrology and renal failure continues to improve. Hospitalization complicated by persistent hypotension requiring prolonged vasopressor support. However now off vasopressors, renal function progressively improving. Anticipate discharge next 48 hours.  Assessment/Plan: 1. Acute kidney injury superimposed on chronic kidney disease stage III-IV. Renal function continues to improve, good urine output. Suspect multifactorial: Dehydration, prerenal, hypotension, complicated by ACE inhibitor and Lasix. Renal ultrasound without evidence of complicating features. 2. Anion gap/non-anion gap mixed metabolic acidosis secondary to acute renal failure. Resolved. 3. Hypotension, circulatory shock. Likely secondary to renal failure and dehydration. No evidence of infection. Now stable off vasopressors. Lactic acid was normal, urinalysis was negative, C. difficile PCR negative. 4. Chronic normocytic anemia, suspect anemia of chronic disease secondary to chronic kidney disease or malignancy. 5. Diabetes mellitus, stable. 6. Acute encephalopathy, resolved. Secondary to profound acute renal failure. 7. Anxiety, depression 8. Severe malnutrition 9. 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 10. Possible enteritis on CT on admission, no clinical significance. C. difficile PCR is negative.   Overall continues to improve with improving renal function. Stable off vasopressors.  Check serum cortisol  If continues to improve likely transfer to  skilled nursing facility in the next 48 hours  Transfer to medical floor today  Code Status: DNR DVT prophylaxis: heparin. Patient refuses SCDs. Family Communication: none present Disposition Plan: SNF [facility])  [name], MD  [facility]  Pager [phone] If 7PM-7AM, please contact night-coverage at [email or web address], password [facility] 12/29/2014, 8:55 AM  LOS: 7 days   Consultants:  Nephrology  PT: [facility]  Procedures:  2-D echocardiogram Study Conclusions  - Procedure narrative: Transthoracic echocardiography. Image quality was suboptimal, with poor valvular visualization. - Left ventricle: The cavity size was normal. Wall thickness was normal. Systolic function was vigorous. The estimated ejection fraction was in the range of 65% to 70%. Images were inadequate for LV wall motion assessment. - Aortic valve: Moderately calcified annulus. Mildly calcified leaflets. Mild aortic stenosis. Peak velocity 2.27 m/s. Mean gradient (S): 13 mm Hg. Valve area (VTI): 1.24 cm^2. Valve area (Vmax): 1.16 cm^2. Valve area (Vmean): 1.08 cm^2. - Mitral valve: Mildly calcified annulus. - Left atrium: The atrium was mildly dilated. - Right ventricle: The cavity size was normal. Wall thickness was mildly increased. - Tricuspid valve: There was moderate regurgitation. Moderately elevated pulmonary pressures (56 mmHg).  Antibiotics:    HPI/Subjective: Feels fine, eating well, no abdominal pain, n/v. No issues per nursing. BP stable overall off vasopressor.  Objective: Filed Vitals:   12/29/14 0300 12/29/14 0400 12/29/14 0500 12/29/14 0700  BP: 78/36 81/47 116/64 101/46  Pulse: 65 62 82 71  Temp:  97.5 F (36.4 C)    TempSrc:  Oral    Resp: 12 11 16 12   Height:      Weight:   91.5 kg (201 lb 11.5 oz)   SpO2: 95% 97% 99% 97%    Intake/Output Summary (Last 24 hours) at 12/29/14 0855 Last data filed at 12/29/14 0215  Gross per 24 hour  Intake    810 ml    Output   1175 ml  Net   -365 ml  Filed Weights   12/27/14 0500 12/28/14 0500 12/29/14 0500  Weight: 87.9 kg (193 lb 12.6 oz) 88.3 kg (194 lb 10.7 oz) 91.5 kg (201 lb 11.5 oz)    Exam:     Afebrile, VSS, SBP 78-105 off vasopressor. General:  Appears calm and comfortable; appears better today Cardiovascular: RRR, no m/r/g. No LE edema. Telemetry: SR, no arrhythmias  Respiratory: CTA bilaterally, no w/r/r. Normal respiratory effort. Musculoskeletal: grossly normal tone BUE/BLE Psychiatric: grossly normal mood and affect, speech fluent and appropriate  Data Reviewed:  Urine output 1175; -365 I/O; +4L since admission  BUN and creatinine continue to improve.   Potassium normal  Pertinent data:  Labs  CBG stable  BUN 147 >> >> 48  Creatinine 13.4 >> >> 2.62  ABG 7.145/27/66 on admission   Imaging   Chest x-ray no acute disease.   CT abdomen and pelvis abnormal nonspecific fluid levels within the colon commonly associated with enteric infection. No small bowel obstruction or mass. Left lower quadrant colostomy.  Other    Pending data:    Scheduled Meds: . antiseptic oral rinse  7 mL Mouth Rinse BID  . clopidogrel  75 mg Oral Daily  . escitalopram  20 mg Oral Daily  . feeding supplement (RESOURCE BREEZE)  1 Container Oral TID BM  . heparin subcutaneous  5,000 Units Subcutaneous 3 times per day  . insulin aspart  0-9 Units Subcutaneous TID WC  . pantoprazole  40 mg Oral Daily  . sodium chloride  10-40 mL Intracatheter Q12H  . sodium chloride  3 mL Intravenous Q12H   Continuous Infusions: . 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

## 2014-12-29 NOTE — Progress Notes (Signed)
Pt a/o.vss. No complaints of any distress. PICC line patent. Report given to Lattie Haw, Therapist, sports. Pt transferred to room 303 via wheelchair with nursing staff and family.

## 2014-12-30 DIAGNOSIS — I252 Old myocardial infarction: Secondary | ICD-10-CM | POA: Diagnosis not present

## 2014-12-30 DIAGNOSIS — E782 Mixed hyperlipidemia: Secondary | ICD-10-CM | POA: Diagnosis not present

## 2014-12-30 DIAGNOSIS — R278 Other lack of coordination: Secondary | ICD-10-CM | POA: Diagnosis not present

## 2014-12-30 DIAGNOSIS — I959 Hypotension, unspecified: Secondary | ICD-10-CM | POA: Diagnosis not present

## 2014-12-30 DIAGNOSIS — E1129 Type 2 diabetes mellitus with other diabetic kidney complication: Secondary | ICD-10-CM | POA: Diagnosis not present

## 2014-12-30 DIAGNOSIS — E43 Unspecified severe protein-calorie malnutrition: Secondary | ICD-10-CM | POA: Diagnosis not present

## 2014-12-30 DIAGNOSIS — R579 Shock, unspecified: Secondary | ICD-10-CM | POA: Diagnosis not present

## 2014-12-30 DIAGNOSIS — N179 Acute kidney failure, unspecified: Secondary | ICD-10-CM | POA: Diagnosis not present

## 2014-12-30 DIAGNOSIS — I509 Heart failure, unspecified: Secondary | ICD-10-CM | POA: Diagnosis not present

## 2014-12-30 DIAGNOSIS — R4182 Altered mental status, unspecified: Secondary | ICD-10-CM | POA: Diagnosis not present

## 2014-12-30 DIAGNOSIS — E46 Unspecified protein-calorie malnutrition: Secondary | ICD-10-CM | POA: Diagnosis not present

## 2014-12-30 DIAGNOSIS — N189 Chronic kidney disease, unspecified: Secondary | ICD-10-CM | POA: Diagnosis not present

## 2014-12-30 DIAGNOSIS — E780 Pure hypercholesterolemia: Secondary | ICD-10-CM | POA: Diagnosis not present

## 2014-12-30 DIAGNOSIS — M6281 Muscle weakness (generalized): Secondary | ICD-10-CM | POA: Diagnosis not present

## 2014-12-30 DIAGNOSIS — D649 Anemia, unspecified: Secondary | ICD-10-CM | POA: Diagnosis not present

## 2014-12-30 DIAGNOSIS — Z8673 Personal history of transient ischemic attack (TIA), and cerebral infarction without residual deficits: Secondary | ICD-10-CM | POA: Diagnosis not present

## 2014-12-30 DIAGNOSIS — G8929 Other chronic pain: Secondary | ICD-10-CM | POA: Diagnosis not present

## 2014-12-30 DIAGNOSIS — N183 Chronic kidney disease, stage 3 (moderate): Secondary | ICD-10-CM | POA: Diagnosis not present

## 2014-12-30 DIAGNOSIS — Z79899 Other long term (current) drug therapy: Secondary | ICD-10-CM | POA: Diagnosis not present

## 2014-12-30 DIAGNOSIS — M1990 Unspecified osteoarthritis, unspecified site: Secondary | ICD-10-CM | POA: Diagnosis not present

## 2014-12-30 DIAGNOSIS — R269 Unspecified abnormalities of gait and mobility: Secondary | ICD-10-CM | POA: Diagnosis not present

## 2014-12-30 DIAGNOSIS — E872 Acidosis: Secondary | ICD-10-CM | POA: Diagnosis not present

## 2014-12-30 DIAGNOSIS — R262 Difficulty in walking, not elsewhere classified: Secondary | ICD-10-CM | POA: Diagnosis not present

## 2014-12-30 DIAGNOSIS — J449 Chronic obstructive pulmonary disease, unspecified: Secondary | ICD-10-CM | POA: Diagnosis not present

## 2014-12-30 DIAGNOSIS — F1120 Opioid dependence, uncomplicated: Secondary | ICD-10-CM | POA: Diagnosis not present

## 2014-12-30 DIAGNOSIS — E039 Hypothyroidism, unspecified: Secondary | ICD-10-CM | POA: Diagnosis not present

## 2014-12-30 DIAGNOSIS — E860 Dehydration: Secondary | ICD-10-CM | POA: Diagnosis not present

## 2014-12-30 DIAGNOSIS — G894 Chronic pain syndrome: Secondary | ICD-10-CM | POA: Diagnosis not present

## 2014-12-30 DIAGNOSIS — Z933 Colostomy status: Secondary | ICD-10-CM | POA: Diagnosis not present

## 2014-12-30 DIAGNOSIS — G9340 Encephalopathy, unspecified: Secondary | ICD-10-CM | POA: Diagnosis not present

## 2014-12-30 DIAGNOSIS — R1319 Other dysphagia: Secondary | ICD-10-CM | POA: Diagnosis not present

## 2014-12-30 DIAGNOSIS — E119 Type 2 diabetes mellitus without complications: Secondary | ICD-10-CM | POA: Diagnosis not present

## 2014-12-30 DIAGNOSIS — C109 Malignant neoplasm of oropharynx, unspecified: Secondary | ICD-10-CM | POA: Diagnosis not present

## 2014-12-30 DIAGNOSIS — D500 Iron deficiency anemia secondary to blood loss (chronic): Secondary | ICD-10-CM | POA: Diagnosis not present

## 2014-12-30 DIAGNOSIS — M545 Low back pain: Secondary | ICD-10-CM | POA: Diagnosis not present

## 2014-12-30 LAB — BASIC METABOLIC PANEL
ANION GAP: 7 (ref 5–15)
BUN: 36 mg/dL — AB (ref 6–23)
CALCIUM: 7.4 mg/dL — AB (ref 8.4–10.5)
CHLORIDE: 111 mmol/L (ref 96–112)
CO2: 25 mmol/L (ref 19–32)
CREATININE: 2.18 mg/dL — AB (ref 0.50–1.10)
GFR calc Af Amer: 24 mL/min — ABNORMAL LOW (ref >90)
GFR calc non Af Amer: 21 mL/min — ABNORMAL LOW (ref >90)
GLUCOSE: 78 mg/dL (ref 70–99)
Potassium: 3.8 mmol/L (ref 3.5–5.1)
Sodium: 143 mmol/L (ref 135–145)

## 2014-12-30 LAB — GLUCOSE, CAPILLARY
GLUCOSE-CAPILLARY: 79 mg/dL (ref 70–99)
GLUCOSE-CAPILLARY: 114 mg/dL — AB (ref 70–99)

## 2014-12-30 MED ORDER — ALPRAZOLAM 0.5 MG PO TABS: 0.5000 mg | ORAL_TABLET | Freq: Four times a day (QID) | ORAL | Status: DC | PRN

## 2014-12-30 MED ORDER — ACETAMINOPHEN 500 MG PO TABS: 500.0000 mg | ORAL_TABLET | Freq: Four times a day (QID) | ORAL | Status: AC | PRN

## 2014-12-30 MED ORDER — ZOLPIDEM TARTRATE 5 MG PO TABS: 5.0000 mg | ORAL_TABLET | Freq: Every day | ORAL | Status: DC

## 2014-12-30 MED ORDER — HYDROCODONE-ACETAMINOPHEN 10-325 MG PO TABS: 1.0000 | ORAL_TABLET | Freq: Four times a day (QID) | ORAL | Status: DC | PRN

## 2014-12-30 MED ORDER — BOOST / RESOURCE BREEZE PO LIQD: 1.0000 | Freq: Three times a day (TID) | ORAL | Status: DC

## 2014-12-30 NOTE — Progress Notes (Signed)
Report called to [facility] No distress noted. Dressing intact right ac where PICC line was removed. Colostomy bag emptied. Taken to lobby via wheelchair.

## 2014-12-30 NOTE — Discharge Summary (Signed)
Physician Discharge Summary  [name] [name] OIZ:[id number] DOB: 1941-10-16 DOA: 12/22/2014  PCP: Glo Herring., MD  Admit date: 12/22/2014 Discharge date: 12/30/2014  Recommendations for Outpatient Follow-up:  1. Resolution of acute kidney injury, see below 2. Blood pressure, history of hypertension but blood pressures been stable off medications which have been discontinued including ACE inhibitor, beta blocker and Lasix. 3. May need to resume Lasix in the near future. Recommend daily weights, follow basic metabolic panel with recheck within the next 2-3 days to ensure improvement in renal function. 4. SNF for short-term rehab 5. Diet-controlled DM 6. Severe malnutrition   Follow-up Information    Follow up with [facility], MD In 4 weeks.   Specialty:  Nephrology   Contact information:   [address] [address] [phone]       Follow up with Glo Herring., MD.   Specialty:  Internal Medicine   Why:  after discharge from SNF   Contact information:   [address] [place] [address] [phone]      Discharge Diagnoses:  1. Acute kidney injury superimposed on chronic kidney disease stage III-IV 2. Anion gap/non-anion gap mixed metabolic acidosis secondary to acute renal failure 3. Hypotension, circulatory shock 4. Dehydration 5. Chronic normocytic anemia, anemia of chronic disease secondary to chronic kidney disease, malignancy 6. Diabetes mellitus, diet controlled 7. Acute encephalopathy, metabolic 8. Severe malnutrition 9. Oral pharyngeal malignancy  Discharge Condition: Improved Disposition: SNF for short-term rehab  Diet recommendation: soft diet, thin liquids  Filed Weights   12/28/14 0500 12/29/14 0500 12/30/14 0538  Weight: 88.3 kg (194 lb 10.7 oz) 91.5 kg (201 lb 11.5 oz) 91.1 kg (200 lb 13.4 oz)    History of present illness:  74 year old woman presented with several day history of acute encephalopathy  and progressive decline over the last month, treated at [facility] for oral cancer approximately one month prior. Admitted for profound acute renal failure, metabolic acidosis, hypotension.   Hospital Course:  [name] [name] was seen in consultation with nephrology for profound acute renal failure. Her condition gradually improved with Lasix and IV fluids. Lasix was subsequently discontinued. Kidney function is now nearing normal with excellent urine output. After discussion with nephrology was felt the patient was stable for transfer to skilled nursing facility as recommended by physical therapy. Hospitalization was complicated by hypotension, possible circulatory shock without evidence of sepsis or infection. Presumably secondary to acute renal failure and dehydration. This condition has resolved. Other issues remained stable. See individual issues below.   Acute kidney injury superimposed on chronic kidney disease stage III-IV. Renal function continues to improve daily, good urine output. Suspect multifactorial: Dehydration, prerenal, hypotension, complicated by ACE inhibitor and Lasix. Renal ultrasound without evidence of complicating features.  Anion gap/non-anion gap mixed metabolic acidosis secondary to acute renal failure. Resolved.  Hypotension, circulatory shock. Resolved. Likely secondary to renal failure and dehydration. No evidence of infection. Remains stable off vasopressors. Lactic acid was normal, urinalysis was negative, C. difficile PCR negative.  Chronic normocytic anemia, suspect anemia of chronic disease secondary to chronic kidney disease or malignancy. Stable.   Diabetes mellitus, remains stable on SSI.  Acute encephalopathy, resolved. Secondary to profound acute renal failure.  Anxiety, depression; stable.  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  Possible enteritis on CT on  admission, no clinical significance. C. difficile PCR is negative.   Improving renal function, good UOP, no evidence  of volume overload on exam  Blood pressure stable without Rx. Lisinopril, metoprolol and Lasix discontinued  Consider SSI but h/o diet controlled DM  Consultants:  Nephrology  PT: [facility]  Procedures:  2-D echocardiogram Study Conclusions  - Procedure narrative: Transthoracic echocardiography. Image quality was suboptimal, with poor valvular visualization. - Left ventricle: The cavity size was normal. Wall thickness was normal. Systolic function was vigorous. The estimated ejection fraction was in the range of 65% to 70%. Images were inadequate for LV wall motion assessment. - Aortic valve: Moderately calcified annulus. Mildly calcified leaflets. Mild aortic stenosis. Peak velocity 2.27 m/s. Mean gradient (S): 13 mm Hg. Valve area (VTI): 1.24 cm^2. Valve area (Vmax): 1.16 cm^2. Valve area (Vmean): 1.08 cm^2. - Mitral valve: Mildly calcified annulus. - Left atrium: The atrium was mildly dilated. - Right ventricle: The cavity size was normal. Wall thickness was mildly increased. - Tricuspid valve: There was moderate regurgitation. Moderately elevated pulmonary pressures (56 mmHg).  Discharge Instructions   Current Discharge Medication List    START taking these medications   Details  feeding supplement, RESOURCE BREEZE, (RESOURCE BREEZE) LIQD Take 1 Container by mouth 3 (three) times daily between meals. Refills: 0      CONTINUE these medications which have CHANGED   Details  acetaminophen (TYLENOL) 500 MG tablet Take 1 tablet (500 mg total) by mouth every 6 (six) hours as needed for mild pain.    ALPRAZolam (XANAX) 0.5 MG tablet Take 1 tablet (0.5 mg total) by mouth 4 (four) times daily as needed for anxiety or sleep. Qty: 10 tablet, Refills: 0    HYDROcodone-acetaminophen (NORCO) 10-325 MG per tablet Take 1 tablet by mouth every 6  (six) hours as needed for moderate pain. Qty: 10 tablet, Refills: 0    zolpidem (AMBIEN) 5 MG tablet Take 1 tablet (5 mg total) by mouth at bedtime.      CONTINUE these medications which have NOT CHANGED   Details  albuterol (PROVENTIL HFA;VENTOLIN HFA) 108 (90 BASE) MCG/ACT inhaler Inhale 2 puffs into the lungs every 6 (six) hours as needed for wheezing or shortness of breath.     clopidogrel (PLAVIX) 75 MG tablet Take 1 tablet (75 mg total) by mouth daily. Qty: 30 tablet, Refills: 6    escitalopram (LEXAPRO) 20 MG tablet Take 20 mg by mouth daily.    ezetimibe (ZETIA) 10 MG tablet Take 10 mg by mouth daily.    lidocaine (XYLOCAINE) 2 % solution Use as directed 15 mLs in the mouth or throat as needed for mouth pain (to be used for up to 10 days starting on 10/12/2014).    pantoprazole (PROTONIX) 40 MG tablet Take 40 mg by mouth daily.    NITROSTAT 0.4 MG SL tablet Place 0.4 mg under the tongue every 5 (five) minutes as needed for chest pain.       STOP taking these medications     furosemide (LASIX) 20 MG tablet      lisinopril (PRINIVIL,ZESTRIL) 2.5 MG tablet      metoprolol tartrate (LOPRESSOR) 25 MG tablet      chlorhexidine (PERIDEX) 0.12 % solution      HYDROcodone-acetaminophen (NORCO/VICODIN) 5-325 MG per tablet      nystatin (MYCOSTATIN) 100000 UNIT/ML suspension        Allergies  Allergen Reactions  . Ciprofloxacin Anaphylaxis  . Aspirin Nausea And Vomiting  . Penicillins Nausea And Vomiting  . Propoxyphene N-Acetaminophen Nausea And Vomiting  . Sulfamethoxazole Rash    The results of  significant diagnostics from this hospitalization (including imaging, microbiology, ancillary and laboratory) are listed below for reference.    Significant Diagnostic Studies: Ct Abdomen Pelvis Wo Contrast  12/22/2014   CLINICAL DATA:  Weight loss. Patient not eating or voiding for the past 3 days. Abdominal mass. Bowel obstruction.  EXAM: CT ABDOMEN AND PELVIS WITHOUT  CONTRAST  TECHNIQUE: Multidetector CT imaging of the abdomen and pelvis was performed following the standard protocol without IV contrast.  COMPARISON:  04/26/2007.  FINDINGS: Musculoskeletal: No aggressive osseous lesions. No thoracolumbar compression fractures. Diffuse calcification of the L5-S1 disc. Fatty atrophy of the gluteal muscles and abdominal wall.  Lung Bases: Scattered areas of atelectasis and scarring at the bases. Coronary artery atherosclerosis is present. If office based assessment of coronary risk factors has not been performed, it is now recommended.  Liver: Unenhanced CT was performed per clinician order. Lack of IV contrast limits sensitivity and specificity, especially for evaluation of abdominal/pelvic solid viscera. Grossly normal.  Spleen:  Normal.  Gallbladder:  Surgically absent.  Common bile duct:  Normal.  Pancreas:  Normal.  Adrenal glands:  Normal bilaterally.  Kidneys: Bilateral simple renal cysts. Ureters appear within normal limits. No calculi. No hydronephrosis.  Stomach: Gastric band is present around the cardia of the stomach. There is no obstruction. Fundus appears within normal limits.  Small bowel: Duodenum is normal. No small bowel obstruction. Small bowel is predominantly decompressed.  Colon: Normal appendix. Fluid levels are present within the colon which are abnormal but nonspecific. These are most commonly associated with enteric infection. LEFT lower quadrant end colostomy is present. Hartmann's pouch in the anatomic pelvis.  Pelvic Genitourinary: Hysterectomy. Urinary bladder appears normal.  Peritoneum: No free air.  No free fluid.  Vasculature: Mild atherosclerosis.  Body Wall: Atrophy.  Negative for hernia.  Midline scarring.  IMPRESSION: 1. Abnormal but nonspecific fluid levels within the colon, most commonly associated with enteric infection. 2. Negative for small bowel obstruction or mass. 3. Simple bilateral renal cysts. 4. Gastric band. 5. Atherosclerosis and  coronary artery disease. 6. LEFT lower quadrant end colostomy and Hartman's pouch.   Electronically Signed   By: Dereck Ligas M.D.   On: 12/22/2014 20:14   Dg Chest Port 1 View  12/23/2014   CLINICAL DATA:  RIGHT-side PICC line placement, altered mental status, anorexia, hypertension, GERD, coronary artery disease post MI, prior stroke, former smoker  EXAM: PORTABLE CHEST - 1 VIEW  COMPARISON:  Portable exam 1734 hours compared 12/22/2014  FINDINGS: RIGHT arm PICC line with tip projecting over RIGHT atrium; recommend withdrawal 6 cm for positioning at approximately the cavoatrial junction.  Enlargement of cardiac silhouette.  Stable mediastinal contours and pulmonary vascularity for technique.  Increased RIGHT basilar atelectasis.  No definite infiltrate, pleural effusion or pneumothorax.  IMPRESSION: Enlargement of cardiac silhouette.  Increased RIGHT basilar atelectasis.  Tip of RIGHT arm PICC line projects over RIGHT atrium; recommend withdrawal 6 cm.  Line placement discussed with Juliette Mangle from PICC Service at time of dictation.   Electronically Signed   By: Lavonia Dana M.D.   On: 12/23/2014 18:00   Dg Chest Port 1 View  12/22/2014   CLINICAL DATA:  COPD. Atrial fibrillation confusion and weakness. Previous stroke.  EXAM: PORTABLE CHEST - 1 VIEW  COMPARISON:  10/21/2014  FINDINGS: The heart size and mediastinal contours are within normal limits. Low lung volumes are noted, however both lungs are clear. No evidence of pleural effusion.  IMPRESSION: No active disease.   Electronically  Signed   By: Earle Gell M.D.   On: 12/22/2014 17:15    Microbiology: Recent Results (from the past 240 hour(s))  MRSA PCR Screening     Status: None   Collection Time: 12/22/14 11:40 PM  Result Value Ref Range Status   MRSA by PCR NEGATIVE NEGATIVE Final    Comment:        The GeneXpert MRSA Assay (FDA approved for NASAL specimens only), is one component of a comprehensive MRSA colonization surveillance  program. It is not intended to diagnose MRSA infection nor to guide or monitor treatment for MRSA infections.   Clostridium Difficile by PCR     Status: None   Collection Time: 12/23/14  1:50 PM  Result Value Ref Range Status   C difficile by pcr NEGATIVE NEGATIVE Final     Labs: Basic Metabolic Panel:  Recent Labs Lab 12/26/14 0515 12/27/14 0519 12/28/14 0600 12/29/14 0520 12/30/14 0541  NA 136 133* 136 138 143  K 2.8* 3.7 4.6 4.3 3.8  CL 90* 94* 96 101 111  CO2 33* 32 33* 31 25  GLUCOSE 165* 90 84 86 78  BUN 84* 68* 59* 48* 36*  CREATININE 5.09* 3.80* 3.02* 2.62* 2.18*  CALCIUM 7.9* 7.7* 8.3* 8.2* 7.4*  MG  --  1.1*  --   --   --    CBC:  Recent Labs Lab 12/24/14 0508 12/25/14 0428  WBC 10.2 8.6  HGB 10.3* 9.6*  HCT 29.8* 28.0*  MCV 83.5 84.1  PLT 269 268    CBG:  Recent Labs Lab 12/29/14 1131 12/29/14 1636 12/29/14 2038 12/30/14 0920 12/30/14 1159  GLUCAP 109* 134* 96 79 114*    Principal Problem:   Acute on chronic renal failure Active Problems:   Hypertension   Hyperlipidemia   Encephalopathy acute   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

## 2014-12-30 NOTE — Clinical Social Work Placement (Signed)
Clinical Social Work Department CLINICAL SOCIAL WORK PLACEMENT NOTE 12/30/2014  Patient:  [name] [name], [name] [name]  Account Number:  [id number] Admit date:  12/22/2014  Clinical Social Worker:  Benay Pike, LCSW  Date/time:  12/27/2014 03:48 PM  Clinical Social Work is seeking post-discharge placement for this patient at the following level of care:   [place]   (*CSW will update this form in Epic as items are completed)   12/27/2014  Patient/family provided with [place] Department of Clinical Social Work's list of facilities offering this level of care within the geographic area requested by the patient (or if unable, by the patient's family).  12/27/2014  Patient/family informed of their freedom to choose among providers that offer the needed level of care, that participate in Medicare, Medicaid or managed care program needed by the patient, have an available bed and are willing to accept the patient.  12/27/2014  Patient/family informed of [facility]' ownership interest in [facility], as well as of the fact that they are under no obligation to receive care at this facility.  PASARR submitted to EDS on 12/27/2014 PASARR number received on 12/27/2014  FL2 transmitted to all facilities in geographic area requested by pt/family on  12/27/2014 FL2 transmitted to all facilities within larger geographic area on   Patient informed that his/her managed care company has contracts with or will negotiate with  certain facilities, including the following:     Patient/family informed of bed offers received:  12/27/2014 Patient chooses bed at [facility] Physician recommends and patient chooses bed at    Patient to be transferred to [facility] on  12/30/2014 Patient to be transferred to facility by family Patient and family notified of transfer on 12/30/2014 Name of family member notified:  Billy-son  The following physician request were  entered in Epic:   Additional Comments:  Benay Pike, [place]

## 2014-12-30 NOTE — Progress Notes (Signed)
Subjective: Interval History: The patient offers no complaints. Her appetite is good and she doesn't have any difficulty breathing. Still she complains of weakness.  Objective: Vital signs in last 24 hours: Temp:  [98.4 F (36.9 C)-98.7 F (37.1 C)] 98.4 F (36.9 C) (02/22 0538) Pulse Rate:  [79-88] 79 (02/22 0538) Resp:  [18] 18 (02/22 0538) BP: (100-127)/(41-51) 106/41 mmHg (02/22 0538) SpO2:  [97 %-100 %] 97 % (02/22 0538) Weight:  [91.1 kg (200 lb 13.4 oz)] 91.1 kg (200 lb 13.4 oz) (02/22 0538) Weight change: -0.4 kg (-14.1 oz)  Intake/Output from previous day: 02/21 0701 - 02/22 0700 In: 3085 [P.O.:720; I.V.:2365] Out: 1850 [Urine:1850] Intake/Output this shift: Total I/O In: 240 [P.O.:240] Out: 100 [Urine:100]  General appearance: alert, cooperative and no distress Resp: clear to auscultation bilaterally Cardio: regular rate and rhythm, S1, S2 normal, no murmur, click, rub or gallop GI: soft, non-tender; bowel sounds normal; no masses,  no organomegaly Extremities: extremities normal, atraumatic, no cyanosis or edema  Lab Results: No results for input(s): WBC, HGB, HCT, PLT in the last 72 hours. BMET:   Recent Labs  12/29/14 0520 12/30/14 0541  NA 138 143  K 4.3 3.8  CL 101 111  CO2 31 25  GLUCOSE 86 78  BUN 48* 36*  CREATININE 2.62* 2.18*  CALCIUM 8.2* 7.4*   No results for input(s): PTH in the last 72 hours. Iron Studies:  No results for input(s): IRON, TIBC, TRANSFERRIN, FERRITIN in the last 72 hours.  Studies/Results: No results found.  I have reviewed the patient's current medications.  Assessment/Plan: Problem #1 acute kidney injury: Her renal function continued to improve. Patient is asymptomatic. Problem #2 metabolic acidosis: Has corrected  Problem #3 hypokalemia: Patient is off potassium supplement and potassium presently is within normal range. Problem #4 anemia: Her hemoglobin and hematocrit is low. Most likely secondary to chronic  disease. Problem #5 hypotension: Her blood pressure remains low but patient is asymptomatic Problem #6 history of oral cancer Problem #7 history of coronary artery disease Plan:. The patient is going to be discharged with her in 4 weeks. We'll DC her IV fluid and encourage her to increase her fluid intake.   LOS: 8 days   Thora Scherman S 12/30/2014,11:24 AM

## 2014-12-30 NOTE — Progress Notes (Signed)
PROGRESS NOTE  [name] [name] MPN:[id number] DOB: [birth year] DOA: 12/22/2014 PCP: Glo Herring., MD  Summary: 74 year old woman presented with several day history of acute encephalopathy and progressive decline over the last month, treated at [facility] for oral cancer approximately one month prior. Admitted for profound acute renal failure, metabolic acidosis, hypotension. Seen in consultation with nephrology and renal failure continues to improve. Hospitalization complicated by persistent hypotension requiring prolonged vasopressor support. However now off vasopressors, renal function progressively improving. Anticipate discharge next 48 hours.  Assessment/Plan: 1. Acute kidney injury superimposed on chronic kidney disease stage III-IV. Renal function continues to improve daily, good urine output. Suspect multifactorial: Dehydration, prerenal, hypotension, complicated by ACE inhibitor and Lasix. Renal ultrasound without evidence of complicating features. 2. Anion gap/non-anion gap mixed metabolic acidosis secondary to acute renal failure. Resolved. 3. Hypotension, circulatory shock. Resolved. Likely secondary to renal failure and dehydration. No evidence of infection. Remains stable off vasopressors. Lactic acid was normal, urinalysis was negative, C. difficile PCR negative. 4. Chronic normocytic anemia, suspect anemia of chronic disease secondary to chronic kidney disease or malignancy. Stable.  5. Diabetes mellitus, remains stable on SSI. 6. Acute encephalopathy, resolved. Secondary to profound acute renal failure. 7. Anxiety, depression; stable. 8. Severe malnutrition 9. 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 10. Possible enteritis on CT on admission, no clinical significance. C. difficile PCR is negative.   Improving renal function, good UOP, no evidence of volume overload on exam  Blood pressure stable  without Rx. Lisinopril, metoprolol and Lasix discontinued  Suggest SSI but h/o diet controlled DM  D/w nephrology--plan discharge today.  Code Status: DNR DVT prophylaxis: heparin. Patient refuses SCDs. Family Communication: none present Disposition Plan: SNF  [facility])  [name], MD  [facility]  Pager [phone] If 7PM-7AM, please contact night-coverage at [email or web address], password [facility] 12/30/2014, 12:20 PM  LOS: 8 days   Consultants:  Nephrology  PT: [facility]  Procedures:  2-D echocardiogram Study Conclusions  - Procedure narrative: Transthoracic echocardiography. Image quality was suboptimal, with poor valvular visualization. - Left ventricle: The cavity size was normal. Wall thickness was normal. Systolic function was vigorous. The estimated ejection fraction was in the range of 65% to 70%. Images were inadequate for LV wall motion assessment. - Aortic valve: Moderately calcified annulus. Mildly calcified leaflets. Mild aortic stenosis. Peak velocity 2.27 m/s. Mean gradient (S): 13 mm Hg. Valve area (VTI): 1.24 cm^2. Valve area (Vmax): 1.16 cm^2. Valve area (Vmean): 1.08 cm^2. - Mitral valve: Mildly calcified annulus. - Left atrium: The atrium was mildly dilated. - Right ventricle: The cavity size was normal. Wall thickness was mildly increased. - Tricuspid valve: There was moderate regurgitation. Moderately elevated pulmonary pressures (56 mmHg).  Antibiotics:    HPI/Subjective: Nephrology stopped IVF. Smock for discharge.  Doing well, eating fine, no pain, no n/v. Breathing fine.  Objective: Filed Vitals:   12/29/14 1102 12/29/14 1337 12/29/14 2042 12/30/14 0538  BP: 101/49 100/42 127/51 106/41  Pulse: 87 81 88 79  Temp: 98.8 F (37.1 C) 98.5 F (36.9 C) 98.7 F (37.1 C) 98.4 F (36.9 C)  TempSrc: Oral Oral Oral Oral  Resp: 18 18 18 18   Height:      Weight:    91.1 kg (200 lb 13.4 oz)  SpO2: 98% 98% 100% 97%     Intake/Output Summary (Last 24 hours) at 12/30/14 1220 Last data filed at 12/30/14 1018  Gross per 24 hour  Intake  3325 ml  Output   1950 ml  Net   1375 ml     Filed Weights   12/28/14 0500 12/29/14 0500 12/30/14 0538  Weight: 88.3 kg (194 lb 10.7 oz) 91.5 kg (201 lb 11.5 oz) 91.1 kg (200 lb 13.4 oz)    Exam:     Afebrile, VSS, BP stable off vasopressor General:  Appears comfortable, calm. Cardiovascular: Regular rate and rhythm, soft 2/6 systolic murmur LUSB, no rub or gallop. 1+ bilateral lower extremity edema. Respiratory: Clear to auscultation bilaterally, no wheezes, rales or rhonchi. Normal respiratory effort. Musculoskeletal: grossly normal tone bilateral upper and lower extremities; moves both legs to command Psychiatric: grossly normal mood and affect, speech fluent and appropriate Neurologic: grossly non-focal.   Data Reviewed:  Urine output 1850; -365 I/O; +5L since admission  CBG stable  BUN and creatinine continue to improve.   Cortisol 14.8  Pertinent data:  Labs  CBG stable  BUN 147 >> >> 48 >> 36  Creatinine 13.4 >> >> 2.62 >> 2.18  ABG 7.145/27/66 on admission   Imaging   Chest x-ray no acute disease.   CT abdomen and pelvis abnormal nonspecific fluid levels within the colon commonly associated with enteric infection. No small bowel obstruction or mass. Left lower quadrant colostomy.  Other    Pending data:    Scheduled Meds: . antiseptic oral rinse  7 mL Mouth Rinse BID  . clopidogrel  75 mg Oral Daily  . escitalopram  20 mg Oral Daily  . feeding supplement (RESOURCE BREEZE)  1 Container Oral TID BM  . heparin subcutaneous  5,000 Units Subcutaneous 3 times per day  . insulin aspart  0-9 Units Subcutaneous TID WC  . pantoprazole  40 mg Oral Daily  . sodium chloride  10-40 mL Intracatheter Q12H  . sodium chloride  3 mL Intravenous Q12H   Continuous Infusions: . sodium chloride 100 mL/hr at 12/30/14 0241    Principal  Problem:   Acute on chronic renal failure Active Problems:   Hypertension   Hyperlipidemia   Encephalopathy acute   Oral-mouth cancer s/p laser excision of verrucus SCC of oral cavity and lips 10/11/14   Protein-calorie malnutrition, severe   Metabolic acidosis   Hypotension

## 2014-12-30 NOTE — Clinical Social Work Note (Signed)
Pt d/c today to [facility]. Pt, pt's son [name], and facility aware and agreeable. Son plans to provide transport. D/C summary faxed.  Benay Pike, [place]

## 2015-01-01 DIAGNOSIS — E119 Type 2 diabetes mellitus without complications: Secondary | ICD-10-CM | POA: Diagnosis not present

## 2015-01-01 DIAGNOSIS — D500 Iron deficiency anemia secondary to blood loss (chronic): Secondary | ICD-10-CM | POA: Diagnosis not present

## 2015-01-01 DIAGNOSIS — E860 Dehydration: Secondary | ICD-10-CM | POA: Diagnosis not present

## 2015-01-01 DIAGNOSIS — E46 Unspecified protein-calorie malnutrition: Secondary | ICD-10-CM | POA: Diagnosis not present

## 2015-01-01 DIAGNOSIS — I959 Hypotension, unspecified: Secondary | ICD-10-CM | POA: Diagnosis not present

## 2015-01-09 DIAGNOSIS — F1120 Opioid dependence, uncomplicated: Secondary | ICD-10-CM | POA: Diagnosis not present

## 2015-01-09 DIAGNOSIS — M545 Low back pain: Secondary | ICD-10-CM | POA: Diagnosis not present

## 2015-01-09 DIAGNOSIS — G8929 Other chronic pain: Secondary | ICD-10-CM | POA: Diagnosis not present

## 2015-01-09 DIAGNOSIS — G894 Chronic pain syndrome: Secondary | ICD-10-CM | POA: Diagnosis not present

## 2015-01-09 DIAGNOSIS — M1990 Unspecified osteoarthritis, unspecified site: Secondary | ICD-10-CM | POA: Diagnosis not present

## 2015-01-19 DIAGNOSIS — E119 Type 2 diabetes mellitus without complications: Secondary | ICD-10-CM | POA: Diagnosis not present

## 2015-01-19 DIAGNOSIS — E46 Unspecified protein-calorie malnutrition: Secondary | ICD-10-CM | POA: Diagnosis not present

## 2015-01-19 DIAGNOSIS — D500 Iron deficiency anemia secondary to blood loss (chronic): Secondary | ICD-10-CM | POA: Diagnosis not present

## 2015-01-19 DIAGNOSIS — I959 Hypotension, unspecified: Secondary | ICD-10-CM | POA: Diagnosis not present

## 2015-01-19 DIAGNOSIS — E860 Dehydration: Secondary | ICD-10-CM | POA: Diagnosis not present

## 2015-01-20 DIAGNOSIS — E43 Unspecified severe protein-calorie malnutrition: Secondary | ICD-10-CM | POA: Diagnosis not present

## 2015-01-20 DIAGNOSIS — D649 Anemia, unspecified: Secondary | ICD-10-CM | POA: Diagnosis not present

## 2015-01-20 DIAGNOSIS — Z8673 Personal history of transient ischemic attack (TIA), and cerebral infarction without residual deficits: Secondary | ICD-10-CM | POA: Diagnosis not present

## 2015-01-20 DIAGNOSIS — F329 Major depressive disorder, single episode, unspecified: Secondary | ICD-10-CM | POA: Diagnosis not present

## 2015-01-20 DIAGNOSIS — Z433 Encounter for attention to colostomy: Secondary | ICD-10-CM | POA: Diagnosis not present

## 2015-01-20 DIAGNOSIS — C109 Malignant neoplasm of oropharynx, unspecified: Secondary | ICD-10-CM | POA: Diagnosis not present

## 2015-01-20 DIAGNOSIS — F419 Anxiety disorder, unspecified: Secondary | ICD-10-CM | POA: Diagnosis not present

## 2015-01-20 DIAGNOSIS — I129 Hypertensive chronic kidney disease with stage 1 through stage 4 chronic kidney disease, or unspecified chronic kidney disease: Secondary | ICD-10-CM | POA: Diagnosis not present

## 2015-01-20 DIAGNOSIS — N183 Chronic kidney disease, stage 3 (moderate): Secondary | ICD-10-CM | POA: Diagnosis not present

## 2015-01-20 DIAGNOSIS — E1129 Type 2 diabetes mellitus with other diabetic kidney complication: Secondary | ICD-10-CM | POA: Diagnosis not present

## 2015-01-21 DIAGNOSIS — I129 Hypertensive chronic kidney disease with stage 1 through stage 4 chronic kidney disease, or unspecified chronic kidney disease: Secondary | ICD-10-CM | POA: Diagnosis not present

## 2015-01-21 DIAGNOSIS — D649 Anemia, unspecified: Secondary | ICD-10-CM | POA: Diagnosis not present

## 2015-01-21 DIAGNOSIS — C109 Malignant neoplasm of oropharynx, unspecified: Secondary | ICD-10-CM | POA: Diagnosis not present

## 2015-01-21 DIAGNOSIS — F419 Anxiety disorder, unspecified: Secondary | ICD-10-CM | POA: Diagnosis not present

## 2015-01-21 DIAGNOSIS — Z433 Encounter for attention to colostomy: Secondary | ICD-10-CM | POA: Diagnosis not present

## 2015-01-21 DIAGNOSIS — Z8673 Personal history of transient ischemic attack (TIA), and cerebral infarction without residual deficits: Secondary | ICD-10-CM | POA: Diagnosis not present

## 2015-01-21 DIAGNOSIS — N183 Chronic kidney disease, stage 3 (moderate): Secondary | ICD-10-CM | POA: Diagnosis not present

## 2015-01-21 DIAGNOSIS — F329 Major depressive disorder, single episode, unspecified: Secondary | ICD-10-CM | POA: Diagnosis not present

## 2015-01-21 DIAGNOSIS — E1129 Type 2 diabetes mellitus with other diabetic kidney complication: Secondary | ICD-10-CM | POA: Diagnosis not present

## 2015-01-21 DIAGNOSIS — E43 Unspecified severe protein-calorie malnutrition: Secondary | ICD-10-CM | POA: Diagnosis not present

## 2015-01-22 DIAGNOSIS — E6609 Other obesity due to excess calories: Secondary | ICD-10-CM | POA: Diagnosis not present

## 2015-01-22 DIAGNOSIS — Z933 Colostomy status: Secondary | ICD-10-CM | POA: Diagnosis not present

## 2015-01-22 DIAGNOSIS — I10 Essential (primary) hypertension: Secondary | ICD-10-CM | POA: Diagnosis not present

## 2015-01-22 DIAGNOSIS — E119 Type 2 diabetes mellitus without complications: Secondary | ICD-10-CM | POA: Diagnosis not present

## 2015-01-22 DIAGNOSIS — Z6834 Body mass index (BMI) 34.0-34.9, adult: Secondary | ICD-10-CM | POA: Diagnosis not present

## 2015-01-22 DIAGNOSIS — G894 Chronic pain syndrome: Secondary | ICD-10-CM | POA: Diagnosis not present

## 2015-01-22 DIAGNOSIS — N179 Acute kidney failure, unspecified: Secondary | ICD-10-CM | POA: Diagnosis not present

## 2015-01-23 DIAGNOSIS — C109 Malignant neoplasm of oropharynx, unspecified: Secondary | ICD-10-CM | POA: Diagnosis not present

## 2015-01-23 DIAGNOSIS — F419 Anxiety disorder, unspecified: Secondary | ICD-10-CM | POA: Diagnosis not present

## 2015-01-23 DIAGNOSIS — Z8673 Personal history of transient ischemic attack (TIA), and cerebral infarction without residual deficits: Secondary | ICD-10-CM | POA: Diagnosis not present

## 2015-01-23 DIAGNOSIS — I129 Hypertensive chronic kidney disease with stage 1 through stage 4 chronic kidney disease, or unspecified chronic kidney disease: Secondary | ICD-10-CM | POA: Diagnosis not present

## 2015-01-23 DIAGNOSIS — Z433 Encounter for attention to colostomy: Secondary | ICD-10-CM | POA: Diagnosis not present

## 2015-01-23 DIAGNOSIS — E43 Unspecified severe protein-calorie malnutrition: Secondary | ICD-10-CM | POA: Diagnosis not present

## 2015-01-23 DIAGNOSIS — N183 Chronic kidney disease, stage 3 (moderate): Secondary | ICD-10-CM | POA: Diagnosis not present

## 2015-01-23 DIAGNOSIS — F329 Major depressive disorder, single episode, unspecified: Secondary | ICD-10-CM | POA: Diagnosis not present

## 2015-01-23 DIAGNOSIS — D649 Anemia, unspecified: Secondary | ICD-10-CM | POA: Diagnosis not present

## 2015-01-23 DIAGNOSIS — E1129 Type 2 diabetes mellitus with other diabetic kidney complication: Secondary | ICD-10-CM | POA: Diagnosis not present

## 2015-01-27 DIAGNOSIS — C109 Malignant neoplasm of oropharynx, unspecified: Secondary | ICD-10-CM | POA: Diagnosis not present

## 2015-01-27 DIAGNOSIS — E1129 Type 2 diabetes mellitus with other diabetic kidney complication: Secondary | ICD-10-CM | POA: Diagnosis not present

## 2015-01-27 DIAGNOSIS — Z433 Encounter for attention to colostomy: Secondary | ICD-10-CM | POA: Diagnosis not present

## 2015-01-27 DIAGNOSIS — N183 Chronic kidney disease, stage 3 (moderate): Secondary | ICD-10-CM | POA: Diagnosis not present

## 2015-01-27 DIAGNOSIS — F329 Major depressive disorder, single episode, unspecified: Secondary | ICD-10-CM | POA: Diagnosis not present

## 2015-01-27 DIAGNOSIS — D649 Anemia, unspecified: Secondary | ICD-10-CM | POA: Diagnosis not present

## 2015-01-27 DIAGNOSIS — E43 Unspecified severe protein-calorie malnutrition: Secondary | ICD-10-CM | POA: Diagnosis not present

## 2015-01-27 DIAGNOSIS — Z8673 Personal history of transient ischemic attack (TIA), and cerebral infarction without residual deficits: Secondary | ICD-10-CM | POA: Diagnosis not present

## 2015-01-27 DIAGNOSIS — I129 Hypertensive chronic kidney disease with stage 1 through stage 4 chronic kidney disease, or unspecified chronic kidney disease: Secondary | ICD-10-CM | POA: Diagnosis not present

## 2015-01-27 DIAGNOSIS — F419 Anxiety disorder, unspecified: Secondary | ICD-10-CM | POA: Diagnosis not present

## 2015-01-30 DIAGNOSIS — C109 Malignant neoplasm of oropharynx, unspecified: Secondary | ICD-10-CM | POA: Diagnosis not present

## 2015-01-30 DIAGNOSIS — Z433 Encounter for attention to colostomy: Secondary | ICD-10-CM | POA: Diagnosis not present

## 2015-01-30 DIAGNOSIS — N183 Chronic kidney disease, stage 3 (moderate): Secondary | ICD-10-CM | POA: Diagnosis not present

## 2015-01-30 DIAGNOSIS — E1129 Type 2 diabetes mellitus with other diabetic kidney complication: Secondary | ICD-10-CM | POA: Diagnosis not present

## 2015-01-30 DIAGNOSIS — F329 Major depressive disorder, single episode, unspecified: Secondary | ICD-10-CM | POA: Diagnosis not present

## 2015-01-30 DIAGNOSIS — Z8673 Personal history of transient ischemic attack (TIA), and cerebral infarction without residual deficits: Secondary | ICD-10-CM | POA: Diagnosis not present

## 2015-01-30 DIAGNOSIS — D649 Anemia, unspecified: Secondary | ICD-10-CM | POA: Diagnosis not present

## 2015-01-30 DIAGNOSIS — I129 Hypertensive chronic kidney disease with stage 1 through stage 4 chronic kidney disease, or unspecified chronic kidney disease: Secondary | ICD-10-CM | POA: Diagnosis not present

## 2015-01-30 DIAGNOSIS — E43 Unspecified severe protein-calorie malnutrition: Secondary | ICD-10-CM | POA: Diagnosis not present

## 2015-01-30 DIAGNOSIS — F419 Anxiety disorder, unspecified: Secondary | ICD-10-CM | POA: Diagnosis not present

## 2015-02-06 DIAGNOSIS — C109 Malignant neoplasm of oropharynx, unspecified: Secondary | ICD-10-CM | POA: Diagnosis not present

## 2015-02-06 DIAGNOSIS — F329 Major depressive disorder, single episode, unspecified: Secondary | ICD-10-CM | POA: Diagnosis not present

## 2015-02-06 DIAGNOSIS — N183 Chronic kidney disease, stage 3 (moderate): Secondary | ICD-10-CM | POA: Diagnosis not present

## 2015-02-06 DIAGNOSIS — F419 Anxiety disorder, unspecified: Secondary | ICD-10-CM | POA: Diagnosis not present

## 2015-02-06 DIAGNOSIS — Z433 Encounter for attention to colostomy: Secondary | ICD-10-CM | POA: Diagnosis not present

## 2015-02-06 DIAGNOSIS — Z8673 Personal history of transient ischemic attack (TIA), and cerebral infarction without residual deficits: Secondary | ICD-10-CM | POA: Diagnosis not present

## 2015-02-06 DIAGNOSIS — E43 Unspecified severe protein-calorie malnutrition: Secondary | ICD-10-CM | POA: Diagnosis not present

## 2015-02-06 DIAGNOSIS — I129 Hypertensive chronic kidney disease with stage 1 through stage 4 chronic kidney disease, or unspecified chronic kidney disease: Secondary | ICD-10-CM | POA: Diagnosis not present

## 2015-02-06 DIAGNOSIS — D649 Anemia, unspecified: Secondary | ICD-10-CM | POA: Diagnosis not present

## 2015-02-06 DIAGNOSIS — E1129 Type 2 diabetes mellitus with other diabetic kidney complication: Secondary | ICD-10-CM | POA: Diagnosis not present

## 2015-02-14 DIAGNOSIS — Z933 Colostomy status: Secondary | ICD-10-CM | POA: Diagnosis not present

## 2015-02-14 DIAGNOSIS — I509 Heart failure, unspecified: Secondary | ICD-10-CM | POA: Diagnosis not present

## 2015-02-14 DIAGNOSIS — R269 Unspecified abnormalities of gait and mobility: Secondary | ICD-10-CM | POA: Diagnosis not present

## 2015-02-14 DIAGNOSIS — J449 Chronic obstructive pulmonary disease, unspecified: Secondary | ICD-10-CM | POA: Diagnosis not present

## 2015-02-18 DIAGNOSIS — D649 Anemia, unspecified: Secondary | ICD-10-CM | POA: Diagnosis not present

## 2015-02-18 DIAGNOSIS — E559 Vitamin D deficiency, unspecified: Secondary | ICD-10-CM | POA: Diagnosis not present

## 2015-02-18 DIAGNOSIS — I10 Essential (primary) hypertension: Secondary | ICD-10-CM | POA: Diagnosis not present

## 2015-02-18 DIAGNOSIS — Z79899 Other long term (current) drug therapy: Secondary | ICD-10-CM | POA: Diagnosis not present

## 2015-02-18 DIAGNOSIS — N183 Chronic kidney disease, stage 3 (moderate): Secondary | ICD-10-CM | POA: Diagnosis not present

## 2015-02-18 DIAGNOSIS — R809 Proteinuria, unspecified: Secondary | ICD-10-CM | POA: Diagnosis not present

## 2015-02-19 ENCOUNTER — Emergency Department (HOSPITAL_COMMUNITY)
Admission: EM | Admit: 2015-02-19 | Discharge: 2015-02-20 | Disposition: A | Discharge status: Home / Self Care | Payer: Medicare Other | Attending: Emergency Medicine | Admitting: Emergency Medicine

## 2015-02-19 ENCOUNTER — Encounter (HOSPITAL_COMMUNITY): Payer: Self-pay | Admitting: *Deleted

## 2015-02-19 DIAGNOSIS — I10 Essential (primary) hypertension: Secondary | ICD-10-CM | POA: Diagnosis not present

## 2015-02-19 DIAGNOSIS — Z880 Allergy status to penicillin: Secondary | ICD-10-CM | POA: Insufficient documentation

## 2015-02-19 DIAGNOSIS — I959 Hypotension, unspecified: Secondary | ICD-10-CM | POA: Diagnosis not present

## 2015-02-19 DIAGNOSIS — N2581 Secondary hyperparathyroidism of renal origin: Secondary | ICD-10-CM | POA: Diagnosis not present

## 2015-02-19 DIAGNOSIS — I252 Old myocardial infarction: Secondary | ICD-10-CM | POA: Diagnosis not present

## 2015-02-19 DIAGNOSIS — Z79899 Other long term (current) drug therapy: Secondary | ICD-10-CM | POA: Insufficient documentation

## 2015-02-19 DIAGNOSIS — F329 Major depressive disorder, single episode, unspecified: Secondary | ICD-10-CM | POA: Insufficient documentation

## 2015-02-19 DIAGNOSIS — F419 Anxiety disorder, unspecified: Secondary | ICD-10-CM | POA: Diagnosis not present

## 2015-02-19 DIAGNOSIS — E872 Acidosis: Secondary | ICD-10-CM | POA: Diagnosis not present

## 2015-02-19 DIAGNOSIS — D638 Anemia in other chronic diseases classified elsewhere: Secondary | ICD-10-CM | POA: Diagnosis not present

## 2015-02-19 DIAGNOSIS — E559 Vitamin D deficiency, unspecified: Secondary | ICD-10-CM | POA: Diagnosis not present

## 2015-02-19 DIAGNOSIS — Z8673 Personal history of transient ischemic attack (TIA), and cerebral infarction without residual deficits: Secondary | ICD-10-CM | POA: Diagnosis not present

## 2015-02-19 DIAGNOSIS — Z7902 Long term (current) use of antithrombotics/antiplatelets: Secondary | ICD-10-CM | POA: Diagnosis not present

## 2015-02-19 DIAGNOSIS — K219 Gastro-esophageal reflux disease without esophagitis: Secondary | ICD-10-CM | POA: Diagnosis not present

## 2015-02-19 DIAGNOSIS — Z87891 Personal history of nicotine dependence: Secondary | ICD-10-CM | POA: Diagnosis not present

## 2015-02-19 DIAGNOSIS — E119 Type 2 diabetes mellitus without complications: Secondary | ICD-10-CM | POA: Insufficient documentation

## 2015-02-19 DIAGNOSIS — N184 Chronic kidney disease, stage 4 (severe): Secondary | ICD-10-CM | POA: Diagnosis not present

## 2015-02-19 LAB — COMPREHENSIVE METABOLIC PANEL
ALBUMIN: 3.8 g/dL (ref 3.5–5.2)
ALT: 11 U/L (ref 0–35)
AST: 14 U/L (ref 0–37)
Alkaline Phosphatase: 103 U/L (ref 39–117)
Anion gap: 5 (ref 5–15)
BUN: 49 mg/dL — ABNORMAL HIGH (ref 6–23)
CALCIUM: 9.1 mg/dL (ref 8.4–10.5)
CO2: 20 mmol/L (ref 19–32)
CREATININE: 2.07 mg/dL — AB (ref 0.50–1.10)
Chloride: 100 mmol/L (ref 96–112)
GFR calc Af Amer: 26 mL/min — ABNORMAL LOW (ref >90)
GFR, EST NON AFRICAN AMERICAN: 22 mL/min — AB (ref >90)
Glucose, Bld: 117 mg/dL — ABNORMAL HIGH (ref 70–99)
Potassium: 4.2 mmol/L (ref 3.5–5.1)
Sodium: 125 mmol/L — ABNORMAL LOW (ref 135–145)
Total Bilirubin: 0.3 mg/dL (ref 0.3–1.2)
Total Protein: 7.1 g/dL (ref 6.0–8.3)

## 2015-02-19 LAB — CBC WITH DIFFERENTIAL/PLATELET
BASOS ABS: 0 10*3/uL (ref 0.0–0.1)
BASOS PCT: 1 % (ref 0–1)
EOS ABS: 0.3 10*3/uL (ref 0.0–0.7)
Eosinophils Relative: 3 % (ref 0–5)
HEMATOCRIT: 31.8 % — AB (ref 36.0–46.0)
Hemoglobin: 10.8 g/dL — ABNORMAL LOW (ref 12.0–15.0)
LYMPHS PCT: 26 % (ref 12–46)
Lymphs Abs: 2.3 10*3/uL (ref 0.7–4.0)
MCH: 29.1 pg (ref 26.0–34.0)
MCHC: 34 g/dL (ref 30.0–36.0)
MCV: 85.7 fL (ref 78.0–100.0)
MONO ABS: 0.6 10*3/uL (ref 0.1–1.0)
Monocytes Relative: 7 % (ref 3–12)
Neutro Abs: 5.4 10*3/uL (ref 1.7–7.7)
Neutrophils Relative %: 63 % (ref 43–77)
PLATELETS: 241 10*3/uL (ref 150–400)
RBC: 3.71 MIL/uL — ABNORMAL LOW (ref 3.87–5.11)
RDW: 13.1 % (ref 11.5–15.5)
WBC: 8.6 10*3/uL (ref 4.0–10.5)

## 2015-02-19 LAB — URINALYSIS, ROUTINE W REFLEX MICROSCOPIC
Bilirubin Urine: NEGATIVE
Glucose, UA: NEGATIVE mg/dL
HGB URINE DIPSTICK: NEGATIVE
Ketones, ur: NEGATIVE mg/dL
Leukocytes, UA: NEGATIVE
Nitrite: NEGATIVE
Protein, ur: NEGATIVE mg/dL
SPECIFIC GRAVITY, URINE: 1.01 (ref 1.005–1.030)
UROBILINOGEN UA: 0.2 mg/dL (ref 0.0–1.0)
pH: 5.5 (ref 5.0–8.0)

## 2015-02-19 MED ORDER — SODIUM CHLORIDE 0.9 % IV BOLUS (SEPSIS)
1000.0000 mL | Freq: Once | INTRAVENOUS | Status: AC
  Administered 2015-02-19: 1000 mL via INTRAVENOUS

## 2015-02-19 MED ORDER — SODIUM CHLORIDE 0.9 % IV SOLN: INTRAVENOUS | Status: DC

## 2015-02-19 NOTE — ED Provider Notes (Signed)
CSN: [id number]     Arrival date & time 02/19/15  2006 History   First MD Initiated Contact with Patient 02/19/15 2157    This chart was scribed for Daleen Bo, MD by Terressa Koyanagi, ED Scribe. This patient was seen in room APA12/APA12 and the patient's care was started at 10:09 PM.  Chief Complaint  Patient presents with  . Hypotension   HPI PCP: Glo Herring., MD  HPI Comments: [name] [name] is a 74 y.o. female, with PMH noted below including kidney failure (Dx 6 months ago), who presents to the Emergency Department complaining of low blood pressure with associated nausea. Pt also complains of weakness and associated trouble walking onset one week ago. Per daughter, pt had a f/u appointment with her nephrologist today for her kidney failure Dx; hereby pt was advised to come to the ED due to hypotension. Daughter reports pt's BP was 70/38 when checked at home PTA to the ED. During exam pt's BP is 92/42. Pt denies appetite change.    Past Medical History  Diagnosis Date  . Anxiety   . Depression   . Hypertension   . GERD (gastroesophageal reflux disease)   . Hyperlipidemia   . Myocardial infarction   . Stroke     times 2  . Diabetes mellitus without complication    Past Surgical History  Procedure Laterality Date  . Abdominal hysterectomy    . Cholecystectomy    . Oophorectomy    . Colostomy    . Tonsillectomy    . Cataract extraction w/phaco Right 03/20/2013    Procedure: CATARACT EXTRACTION PHACO AND INTRAOCULAR LENS PLACEMENT (IOC);  Surgeon: Elta Guadeloupe T. Gershon Crane, MD;  Location: [facility];  Service: Ophthalmology;  Laterality: Right;  CDE:  6.29    Family History  Problem Relation Age of Onset  . Cancer Other   . Diabetes Other   . Abnormal EKG Other   . Arthritis Other   . Asthma Other   . Kidney disease Other    History  Substance Use Topics  . Smoking status: Former Smoker -- 0.25 packs/day for 15 years    Types: Cigarettes    Start date: 12/12/1956    Quit  date: 08/18/1972  . Smokeless tobacco: Current User    Types: Snuff     Comment: 1-2 cigs daily x 6 months  . Alcohol Use: No   OB History    No data available     Review of Systems  Constitutional: Negative for fever, chills and appetite change.  Respiratory: Positive for cough.   Gastrointestinal: Positive for nausea. Negative for vomiting.  Musculoskeletal: Positive for gait problem.  Neurological: Positive for weakness.      Allergies  Ciprofloxacin; Aspirin; Penicillins; Propoxyphene n-acetaminophen; and Sulfamethoxazole  Home Medications   Prior to Admission medications   Medication Sig Start Date End Date Taking? Authorizing Provider  acetaminophen (TYLENOL) 500 MG tablet Take 1 tablet (500 mg total) by mouth every 6 (six) hours as needed for mild pain. 12/30/14   Samuella Cota, MD  albuterol (PROVENTIL HFA;VENTOLIN HFA) 108 (90 BASE) MCG/ACT inhaler Inhale 2 puffs into the lungs every 6 (six) hours as needed for wheezing or shortness of breath.     Historical Provider, MD  ALPRAZolam Duanne Moron) 0.5 MG tablet Take 1 tablet (0.5 mg total) by mouth 4 (four) times daily as needed for anxiety or sleep. 12/30/14   Samuella Cota, MD  clopidogrel (PLAVIX) 75 MG tablet Take 1 tablet (75 mg  total) by mouth daily. 09/26/12   Renella Cunas, MD  escitalopram (LEXAPRO) 20 MG tablet Take 20 mg by mouth daily.    Historical Provider, MD  ezetimibe (ZETIA) 10 MG tablet Take 10 mg by mouth daily.    Historical Provider, MD  feeding supplement, RESOURCE BREEZE, (RESOURCE BREEZE) LIQD Take 1 Container by mouth 3 (three) times daily between meals. 12/30/14   Samuella Cota, MD  HYDROcodone-acetaminophen [name]) 10-325 MG per tablet Take 1 tablet by mouth every 6 (six) hours as needed for moderate pain. 12/30/14   Samuella Cota, MD  lidocaine (XYLOCAINE) 2 % solution Use as directed 15 mLs in the mouth or throat as needed for mouth pain (to be used for up to 10 days starting on  10/12/2014).    Historical Provider, MD  NITROSTAT 0.4 MG SL tablet Place 0.4 mg under the tongue every 5 (five) minutes as needed for chest pain.  07/28/12   Historical Provider, MD  pantoprazole (PROTONIX) 40 MG tablet Take 40 mg by mouth daily.    Historical Provider, MD  zolpidem (AMBIEN) 5 MG tablet Take 1 tablet (5 mg total) by mouth at bedtime. 12/30/14   Samuella Cota, MD   Triage Vitals: BP 92/42 mmHg  Pulse 63  Temp(Src) 97.8 F (36.6 C) (Oral)  Resp 16  Ht 5\' 2"  (1.575 m)  Wt 173 lb (78.472 kg)  BMI 31.63 kg/m2  SpO2 100% Physical Exam  Constitutional: She appears well-developed and well-nourished.  HENT:  Head: Normocephalic and atraumatic.  Mouth/Throat: Mucous membranes are dry.  White plaque in alveolar ridges bilaterally of the lower jaw.  Lower lip with indistinct mass 1x3 cm  Eyes: Conjunctivae and EOM are normal. Pupils are equal, round, and reactive to light.  Neck: Normal range of motion and phonation normal. Neck supple.  Cardiovascular: Normal rate and regular rhythm.   Pulmonary/Chest: Effort normal and breath sounds normal. She exhibits no tenderness.  Abdominal: Soft. She exhibits no distension. There is no tenderness. There is no guarding.  Musculoskeletal: Normal range of motion.  Neurological: She is alert. She exhibits normal muscle tone.  Skin: Skin is warm and dry.  Psychiatric: She has a normal mood and affect. Her behavior is normal.  Nursing note and vitals reviewed.   ED Course  Procedures (including critical care time) DIAGNOSTIC STUDIES: Oxygen Saturation is 100% on RA, nl by my interpretation.    COORDINATION OF CARE: 10:16 PM-Discussed treatment plan with pt at bedside and pt agreed to plan.   Medications  0.9 %  sodium chloride infusion (not administered)  sodium chloride 0.9 % bolus 1,000 mL (0 mLs Intravenous Stopped 02/20/15 0019)    Patient Vitals for the past 24 hrs:  BP Temp Temp src Pulse Resp SpO2 Height Weight  02/19/15  2354 (!) 110/50 mmHg - - 60 18 99 % - -  02/19/15 2256 112/69 mmHg - - 65 18 99 % - -  02/19/15 2212 (!) 92/42 mmHg - - 60 16 100 % - -  02/19/15 2021 (!) 92/42 mmHg 97.8 F (36.6 C) Oral 63 16 100 % 5\' 2"  (1.575 m) 173 lb (78.472 kg)        Labs Review Labs Reviewed  CBC WITH DIFFERENTIAL/PLATELET - Abnormal; Notable for the following:    RBC 3.71 (*)    Hemoglobin 10.8 (*)    HCT 31.8 (*)    All other components within normal limits  COMPREHENSIVE METABOLIC PANEL - Abnormal; Notable for  the following:    Sodium 125 (*)    Glucose, Bld 117 (*)    BUN 49 (*)    Creatinine, Ser 2.07 (*)    GFR calc non Af Amer 22 (*)    GFR calc Af Amer 26 (*)    All other components within normal limits  URINALYSIS, ROUTINE W REFLEX MICROSCOPIC - Abnormal; Notable for the following:    Color, Urine STRAW (*)    All other components within normal limits  URINE CULTURE   Filed Vitals:   02/19/15 2354  BP: 110/50  Pulse: 60  Temp:   Resp: 18   Meds ordered this encounter  Medications  . sodium chloride 0.9 % bolus 1,000 mL    Sig:   . 0.9 %  sodium chloride infusion    Sig:     Imaging Review No results found.   EKG Interpretation None      MDM   Final diagnoses:  Hypotension, unspecified hypotension type    Nonspecific hypotension which improved with IV fluids. Suspect mild dehydration. No evidence for serious bacterial infection. Metabolic instability, or suspected impending vascular collapse.  Nursing Notes Reviewed/ Care Coordinated Applicable Imaging Reviewed Interpretation of Laboratory Data incorporated into ED treatment  The patient appears reasonably screened and/or stabilized for discharge and I doubt any other medical condition or other [facility] requiring further screening, evaluation, or treatment in the ED at this time prior to discharge.  Plan: Home Medications- usual; Home Treatments- rest; return here if the recommended treatment, does not improve the  symptoms; Recommended follow up- PCP 1 week and prn    I personally performed the services described in this documentation, which was scribed in my presence. The recorded information has been reviewed and is accurate.       Daleen Bo, MD 02/20/15 [phone]

## 2015-02-19 NOTE — ED Notes (Signed)
Nausea, weakness hypotension , Seen by MD today and told to go to ED.for iv flds.

## 2015-02-20 NOTE — Discharge Instructions (Signed)
°  Drink plenty of fluids and eat a regular diet. See your Dr. for a checkup, next week  Hypotension As your heart beats, it forces blood through your arteries. This force is your blood pressure. If your blood pressure is too low for you to go about your normal activities or to support the organs of your body, you have hypotension. Hypotension is also referred to as low blood pressure. When your blood pressure becomes too low, you may not get enough blood to your brain. As a result, you may feel weak, feel lightheaded, or develop a rapid heart rate. In a more severe case, you may faint. CAUSES Various conditions can cause hypotension. These include:  Blood loss.  Dehydration.  Heart or endocrine problems.  Pregnancy.  Severe infection.  Not having a well-balanced diet filled with needed nutrients.  Severe allergic reactions (anaphylaxis). Some medicines, such as blood pressure medicine or water pills (diuretics), may lower your blood pressure below normal. Sometimes taking too much medicine or taking medicine not as directed can cause hypotension. TREATMENT  Hospitalization is sometimes required for hypotension if fluid or blood replacement is needed, if time is needed for medicines to wear off, or if further monitoring is needed. Treatment might include changing your diet, changing your medicines (including medicines aimed at raising your blood pressure), and use of support stockings. HOME CARE INSTRUCTIONS   Drink enough fluids to keep your urine clear or pale yellow.  Take your medicines as directed by your health care provider.  Get up slowly from reclining or sitting positions. This gives your blood pressure a chance to adjust.  Wear support stockings as directed by your health care provider.  Maintain a healthy diet by including nutritious food, such as fruits, vegetables, nuts, whole grains, and lean meats. SEEK MEDICAL CARE IF:  You have vomiting or diarrhea.  You have a  fever for more than 2-3 days.  You feel more thirsty than usual.  You feel weak and tired. SEEK IMMEDIATE MEDICAL CARE IF:   You have chest pain or a fast or irregular heartbeat.  You have a loss of feeling in some part of your body, or you lose movement in your arms or legs.  You have trouble speaking.  You become sweaty or feel lightheaded.  You faint. MAKE SURE YOU:   Understand these instructions.  Will watch your condition.  Will get help right away if you are not doing well or get worse. Document Released: 10/25/2005 Document Revised: 08/15/2013 Document Reviewed: 04/27/2013 [facility] Patient Information 2015 [place], [place]. This information is not intended to replace advice given to you by your health care provider. Make sure you discuss any questions you have with your health care provider.

## 2015-02-21 LAB — URINE CULTURE
Colony Count: NO GROWTH
Culture: NO GROWTH

## 2015-03-10 ENCOUNTER — Inpatient Hospital Stay (HOSPITAL_COMMUNITY)
Admission: EM | Admit: 2015-03-10 | Discharge: 2015-03-16 | DRG: 682 | Disposition: A | Discharge status: Home Health | Payer: Medicare Other | Attending: Internal Medicine | Admitting: Internal Medicine

## 2015-03-10 ENCOUNTER — Emergency Department (HOSPITAL_COMMUNITY): Payer: Medicare Other

## 2015-03-10 ENCOUNTER — Encounter (HOSPITAL_COMMUNITY): Payer: Self-pay | Admitting: Emergency Medicine

## 2015-03-10 DIAGNOSIS — N179 Acute kidney failure, unspecified: Secondary | ICD-10-CM | POA: Diagnosis present

## 2015-03-10 DIAGNOSIS — N184 Chronic kidney disease, stage 4 (severe): Secondary | ICD-10-CM | POA: Diagnosis not present

## 2015-03-10 DIAGNOSIS — E872 Acidosis, unspecified: Secondary | ICD-10-CM | POA: Diagnosis present

## 2015-03-10 DIAGNOSIS — Z825 Family history of asthma and other chronic lower respiratory diseases: Secondary | ICD-10-CM | POA: Diagnosis not present

## 2015-03-10 DIAGNOSIS — Z8673 Personal history of transient ischemic attack (TIA), and cerebral infarction without residual deficits: Secondary | ICD-10-CM | POA: Diagnosis not present

## 2015-03-10 DIAGNOSIS — N189 Chronic kidney disease, unspecified: Secondary | ICD-10-CM | POA: Diagnosis not present

## 2015-03-10 DIAGNOSIS — R531 Weakness: Secondary | ICD-10-CM | POA: Diagnosis not present

## 2015-03-10 DIAGNOSIS — E876 Hypokalemia: Secondary | ICD-10-CM | POA: Diagnosis not present

## 2015-03-10 DIAGNOSIS — C069 Malignant neoplasm of mouth, unspecified: Secondary | ICD-10-CM | POA: Diagnosis present

## 2015-03-10 DIAGNOSIS — K219 Gastro-esophageal reflux disease without esophagitis: Secondary | ICD-10-CM | POA: Diagnosis present

## 2015-03-10 DIAGNOSIS — N170 Acute kidney failure with tubular necrosis: Principal | ICD-10-CM | POA: Diagnosis present

## 2015-03-10 DIAGNOSIS — Z933 Colostomy status: Secondary | ICD-10-CM

## 2015-03-10 DIAGNOSIS — R571 Hypovolemic shock: Secondary | ICD-10-CM | POA: Diagnosis not present

## 2015-03-10 DIAGNOSIS — E785 Hyperlipidemia, unspecified: Secondary | ICD-10-CM | POA: Diagnosis not present

## 2015-03-10 DIAGNOSIS — R404 Transient alteration of awareness: Secondary | ICD-10-CM | POA: Diagnosis not present

## 2015-03-10 DIAGNOSIS — E860 Dehydration: Secondary | ICD-10-CM | POA: Diagnosis present

## 2015-03-10 DIAGNOSIS — E119 Type 2 diabetes mellitus without complications: Secondary | ICD-10-CM | POA: Diagnosis not present

## 2015-03-10 DIAGNOSIS — I509 Heart failure, unspecified: Secondary | ICD-10-CM | POA: Diagnosis not present

## 2015-03-10 DIAGNOSIS — Z7902 Long term (current) use of antithrombotics/antiplatelets: Secondary | ICD-10-CM

## 2015-03-10 DIAGNOSIS — D638 Anemia in other chronic diseases classified elsewhere: Secondary | ICD-10-CM | POA: Diagnosis not present

## 2015-03-10 DIAGNOSIS — F329 Major depressive disorder, single episode, unspecified: Secondary | ICD-10-CM | POA: Diagnosis not present

## 2015-03-10 DIAGNOSIS — D649 Anemia, unspecified: Secondary | ICD-10-CM | POA: Diagnosis not present

## 2015-03-10 DIAGNOSIS — G9341 Metabolic encephalopathy: Secondary | ICD-10-CM | POA: Diagnosis present

## 2015-03-10 DIAGNOSIS — I129 Hypertensive chronic kidney disease with stage 1 through stage 4 chronic kidney disease, or unspecified chronic kidney disease: Secondary | ICD-10-CM | POA: Diagnosis not present

## 2015-03-10 DIAGNOSIS — R1310 Dysphagia, unspecified: Secondary | ICD-10-CM | POA: Diagnosis not present

## 2015-03-10 DIAGNOSIS — R269 Unspecified abnormalities of gait and mobility: Secondary | ICD-10-CM | POA: Diagnosis not present

## 2015-03-10 DIAGNOSIS — Z833 Family history of diabetes mellitus: Secondary | ICD-10-CM | POA: Diagnosis not present

## 2015-03-10 DIAGNOSIS — I959 Hypotension, unspecified: Secondary | ICD-10-CM

## 2015-03-10 DIAGNOSIS — Z87891 Personal history of nicotine dependence: Secondary | ICD-10-CM | POA: Diagnosis not present

## 2015-03-10 DIAGNOSIS — I252 Old myocardial infarction: Secondary | ICD-10-CM | POA: Diagnosis not present

## 2015-03-10 DIAGNOSIS — I9589 Other hypotension: Secondary | ICD-10-CM | POA: Diagnosis not present

## 2015-03-10 DIAGNOSIS — G9340 Encephalopathy, unspecified: Secondary | ICD-10-CM | POA: Diagnosis not present

## 2015-03-10 DIAGNOSIS — I10 Essential (primary) hypertension: Secondary | ICD-10-CM | POA: Diagnosis not present

## 2015-03-10 DIAGNOSIS — F32A Depression, unspecified: Secondary | ICD-10-CM | POA: Diagnosis present

## 2015-03-10 DIAGNOSIS — R5383 Other fatigue: Secondary | ICD-10-CM | POA: Diagnosis not present

## 2015-03-10 DIAGNOSIS — J449 Chronic obstructive pulmonary disease, unspecified: Secondary | ICD-10-CM | POA: Diagnosis not present

## 2015-03-10 HISTORY — DX: Malignant (primary) neoplasm, unspecified: C80.1

## 2015-03-10 LAB — BASIC METABOLIC PANEL
Anion gap: 10 (ref 5–15)
BUN: 93 mg/dL — AB (ref 6–20)
CALCIUM: 8.5 mg/dL — AB (ref 8.9–10.3)
CO2: 11 mmol/L — ABNORMAL LOW (ref 22–32)
Chloride: 113 mmol/L — ABNORMAL HIGH (ref 101–111)
Creatinine, Ser: 6.02 mg/dL — ABNORMAL HIGH (ref 0.44–1.00)
GFR calc Af Amer: 7 mL/min — ABNORMAL LOW (ref >60)
GFR calc non Af Amer: 6 mL/min — ABNORMAL LOW (ref >60)
Glucose, Bld: 93 mg/dL (ref 70–99)
Potassium: 4.5 mmol/L (ref 3.5–5.1)
SODIUM: 134 mmol/L — AB (ref 135–145)

## 2015-03-10 LAB — COMPREHENSIVE METABOLIC PANEL
ALBUMIN: 3.5 g/dL (ref 3.5–5.0)
ALT: 10 U/L — ABNORMAL LOW (ref 14–54)
ANION GAP: 10 (ref 5–15)
AST: 9 U/L — ABNORMAL LOW (ref 15–41)
Alkaline Phosphatase: 75 U/L (ref 38–126)
BILIRUBIN TOTAL: 0.3 mg/dL (ref 0.3–1.2)
BUN: 100 mg/dL — ABNORMAL HIGH (ref 6–20)
CO2: 13 mmol/L — AB (ref 22–32)
CREATININE: 6.58 mg/dL — AB (ref 0.44–1.00)
Calcium: 8.8 mg/dL — ABNORMAL LOW (ref 8.9–10.3)
Chloride: 107 mmol/L (ref 101–111)
GFR calc Af Amer: 6 mL/min — ABNORMAL LOW (ref >60)
GFR, EST NON AFRICAN AMERICAN: 6 mL/min — AB (ref >60)
Glucose, Bld: 112 mg/dL — ABNORMAL HIGH (ref 70–99)
POTASSIUM: 4.9 mmol/L (ref 3.5–5.1)
SODIUM: 130 mmol/L — AB (ref 135–145)
TOTAL PROTEIN: 6.3 g/dL — AB (ref 6.5–8.1)

## 2015-03-10 LAB — CBC WITH DIFFERENTIAL/PLATELET
Basophils Absolute: 0 10*3/uL (ref 0.0–0.1)
Basophils Relative: 0 % (ref 0–1)
Eosinophils Absolute: 0.1 10*3/uL (ref 0.0–0.7)
Eosinophils Relative: 1 % (ref 0–5)
HCT: 29.1 % — ABNORMAL LOW (ref 36.0–46.0)
Hemoglobin: 9.7 g/dL — ABNORMAL LOW (ref 12.0–15.0)
Lymphocytes Relative: 21 % (ref 12–46)
Lymphs Abs: 2 10*3/uL (ref 0.7–4.0)
MCH: 28.8 pg (ref 26.0–34.0)
MCHC: 33.3 g/dL (ref 30.0–36.0)
MCV: 86.4 fL (ref 78.0–100.0)
MONO ABS: 0.6 10*3/uL (ref 0.1–1.0)
Monocytes Relative: 7 % (ref 3–12)
NEUTROS ABS: 6.8 10*3/uL (ref 1.7–7.7)
Neutrophils Relative %: 71 % (ref 43–77)
Platelets: 235 10*3/uL (ref 150–400)
RBC: 3.37 MIL/uL — ABNORMAL LOW (ref 3.87–5.11)
RDW: 13.6 % (ref 11.5–15.5)
WBC: 9.6 10*3/uL (ref 4.0–10.5)

## 2015-03-10 LAB — URINALYSIS, ROUTINE W REFLEX MICROSCOPIC
Bilirubin Urine: NEGATIVE
Glucose, UA: NEGATIVE mg/dL
HGB URINE DIPSTICK: NEGATIVE
KETONES UR: NEGATIVE mg/dL
Leukocytes, UA: NEGATIVE
Nitrite: NEGATIVE
Protein, ur: NEGATIVE mg/dL
Specific Gravity, Urine: >1.03 — ABNORMAL HIGH (ref 1.005–1.030)
UROBILINOGEN UA: 0.2 mg/dL (ref 0.0–1.0)
pH: 5 (ref 5.0–8.0)

## 2015-03-10 LAB — MRSA PCR SCREENING: MRSA by PCR: NEGATIVE

## 2015-03-10 LAB — I-STAT CG4 LACTIC ACID, ED: LACTIC ACID, VENOUS: 0.61 mmol/L (ref 0.5–2.0)

## 2015-03-10 MED ORDER — ALBUTEROL SULFATE HFA 108 (90 BASE) MCG/ACT IN AERS
2.0000 | INHALATION_SPRAY | Freq: Four times a day (QID) | RESPIRATORY_TRACT | Status: DC | PRN
  Filled 2015-03-10: qty 6.7

## 2015-03-10 MED ORDER — ACETAMINOPHEN 500 MG PO TABS
500.0000 mg | ORAL_TABLET | Freq: Four times a day (QID) | ORAL | Status: DC | PRN
  Administered 2015-03-10: 500 mg via ORAL
  Filled 2015-03-10: qty 1

## 2015-03-10 MED ORDER — DEXTROSE 5 % IV SOLN
0.0000 ug/min | INTRAVENOUS | Status: DC
  Filled 2015-03-10: qty 1

## 2015-03-10 MED ORDER — PANTOPRAZOLE SODIUM 40 MG PO TBEC
40.0000 mg | DELAYED_RELEASE_TABLET | Freq: Every day | ORAL | Status: DC
  Administered 2015-03-11 – 2015-03-16 (×6): 40 mg via ORAL
  Filled 2015-03-10 (×6): qty 1

## 2015-03-10 MED ORDER — SODIUM CHLORIDE 0.9 % IV BOLUS (SEPSIS)
1000.0000 mL | Freq: Once | INTRAVENOUS | Status: AC
  Administered 2015-03-10: 1000 mL via INTRAVENOUS

## 2015-03-10 MED ORDER — ZOLPIDEM TARTRATE 5 MG PO TABS
5.0000 mg | ORAL_TABLET | Freq: Every evening | ORAL | Status: DC | PRN
  Administered 2015-03-12 – 2015-03-15 (×5): 5 mg via ORAL
  Filled 2015-03-10 (×5): qty 1

## 2015-03-10 MED ORDER — ENOXAPARIN SODIUM 30 MG/0.3ML ~~LOC~~ SOLN
30.0000 mg | SUBCUTANEOUS | Status: DC
  Administered 2015-03-10 – 2015-03-15 (×6): 30 mg via SUBCUTANEOUS
  Filled 2015-03-10 (×6): qty 0.3

## 2015-03-10 MED ORDER — LIDOCAINE VISCOUS 2 % MT SOLN: 15.0000 mL | OROMUCOSAL | Status: DC | PRN

## 2015-03-10 MED ORDER — ALPRAZOLAM 0.5 MG PO TABS
0.5000 mg | ORAL_TABLET | Freq: Four times a day (QID) | ORAL | Status: DC | PRN
  Administered 2015-03-10 – 2015-03-15 (×6): 0.5 mg via ORAL
  Filled 2015-03-10 (×6): qty 1

## 2015-03-10 MED ORDER — ALBUTEROL SULFATE (2.5 MG/3ML) 0.083% IN NEBU: 3.0000 mL | INHALATION_SOLUTION | Freq: Four times a day (QID) | RESPIRATORY_TRACT | Status: DC | PRN

## 2015-03-10 MED ORDER — CLOPIDOGREL BISULFATE 75 MG PO TABS
75.0000 mg | ORAL_TABLET | Freq: Every day | ORAL | Status: DC
  Administered 2015-03-11 – 2015-03-16 (×6): 75 mg via ORAL
  Filled 2015-03-10 (×6): qty 1

## 2015-03-10 MED ORDER — HYDROCODONE-ACETAMINOPHEN 10-325 MG PO TABS
1.0000 | ORAL_TABLET | Freq: Four times a day (QID) | ORAL | Status: DC | PRN
  Administered 2015-03-12 – 2015-03-15 (×6): 1 via ORAL
  Filled 2015-03-10 (×6): qty 1

## 2015-03-10 MED ORDER — SODIUM BICARBONATE 8.4 % IV SOLN
INTRAVENOUS | Status: AC
  Filled 2015-03-10: qty 100

## 2015-03-10 MED ORDER — DOPAMINE-DEXTROSE 3.2-5 MG/ML-% IV SOLN
0.0000 ug/kg/min | INTRAVENOUS | Status: DC
  Administered 2015-03-10 – 2015-03-13 (×3): 5 ug/kg/min via INTRAVENOUS
  Filled 2015-03-10 (×3): qty 250

## 2015-03-10 MED ORDER — SODIUM CHLORIDE 0.9 % IV SOLN
INTRAVENOUS | Status: DC
  Administered 2015-03-10: 20:00:00 via INTRAVENOUS

## 2015-03-10 MED ORDER — SODIUM BICARBONATE 8.4 % IV SOLN
INTRAVENOUS | Status: DC
  Administered 2015-03-11: via INTRAVENOUS
  Filled 2015-03-10 (×4): qty 100

## 2015-03-10 MED ORDER — ESCITALOPRAM OXALATE 10 MG PO TABS
20.0000 mg | ORAL_TABLET | Freq: Every day | ORAL | Status: DC
  Administered 2015-03-11 – 2015-03-16 (×6): 20 mg via ORAL
  Filled 2015-03-10 (×4): qty 2
  Filled 2015-03-10 (×2): qty 1
  Filled 2015-03-10 (×2): qty 2

## 2015-03-10 MED ORDER — EZETIMIBE 10 MG PO TABS
10.0000 mg | ORAL_TABLET | Freq: Every day | ORAL | Status: DC
  Administered 2015-03-11 – 2015-03-16 (×6): 10 mg via ORAL
  Filled 2015-03-10 (×6): qty 1

## 2015-03-10 MED ORDER — ONDANSETRON HCL 4 MG PO TABS: 4.0000 mg | ORAL_TABLET | Freq: Four times a day (QID) | ORAL | Status: DC | PRN

## 2015-03-10 MED ORDER — SODIUM CHLORIDE 0.9 % IJ SOLN
3.0000 mL | Freq: Two times a day (BID) | INTRAMUSCULAR | Status: DC
  Administered 2015-03-10 – 2015-03-16 (×11): 3 mL via INTRAVENOUS

## 2015-03-10 MED ORDER — ONDANSETRON HCL 4 MG/2ML IJ SOLN
4.0000 mg | Freq: Four times a day (QID) | INTRAMUSCULAR | Status: DC | PRN
  Administered 2015-03-11: 4 mg via INTRAVENOUS
  Filled 2015-03-10: qty 2

## 2015-03-10 MED ORDER — NITROGLYCERIN 0.4 MG SL SUBL: 0.4000 mg | SUBLINGUAL_TABLET | SUBLINGUAL | Status: DC | PRN

## 2015-03-10 MED ORDER — STERILE WATER FOR INJECTION IV SOLN: INTRAVENOUS | Status: DC

## 2015-03-10 NOTE — Progress Notes (Signed)
Patient with oral cancer who takes little by mouth due to pain admitted with acute on chronic renal failure, dehydration, hypotension.    Notified of patients persistent hypotension and decreased urine output in spite of fairly aggressive IV fluids. Additional bolus provided and BMET repeated revealing worsening acidosis and no improvement in BP. No signs of infection. Chart review indicates similar presentation 2 months ago and patient required vasopressors and bicarb drip during that hospitalization.   Discussed with Dr Marin Comment.  Dopamine and D5W with 1 amp bicarb started.    Santiago Glad m. Black, NP

## 2015-03-10 NOTE — ED Provider Notes (Signed)
CSN: [id number]     Arrival date & time 03/10/15  1640 History   First MD Initiated Contact with Patient 03/10/15 1641     Chief Complaint  Patient presents with  . Hypotension  . Fatigue     (Consider location/radiation/quality/duration/timing/severity/associated sxs/prior Treatment) Patient is a 74 y.o. female presenting with weakness. The history is provided by the patient (the pt complains of weakness and she has not been eating or drinking.  she has oral cancer).  Weakness This is a recurrent problem. The current episode started more than 1 week ago. The problem occurs constantly. The problem has not changed since onset.Pertinent negatives include no chest pain, no abdominal pain and no headaches. Nothing aggravates the symptoms. Nothing relieves the symptoms.    Past Medical History  Diagnosis Date  . Anxiety   . Depression   . Hypertension   . GERD (gastroesophageal reflux disease)   . Hyperlipidemia   . Myocardial infarction   . Stroke     times 2  . Diabetes mellitus without complication   . Cancer    Past Surgical History  Procedure Laterality Date  . Abdominal hysterectomy    . Cholecystectomy    . Oophorectomy    . Colostomy    . Tonsillectomy    . Cataract extraction w/phaco Right 03/20/2013    Procedure: CATARACT EXTRACTION PHACO AND INTRAOCULAR LENS PLACEMENT (IOC);  Surgeon: Elta Guadeloupe T. Gershon Crane, MD;  Location: [facility];  Service: Ophthalmology;  Laterality: Right;  CDE:  6.29    Family History  Problem Relation Age of Onset  . Cancer Other   . Diabetes Other   . Abnormal EKG Other   . Arthritis Other   . Asthma Other   . Kidney disease Other    History  Substance Use Topics  . Smoking status: Former Smoker -- 0.25 packs/day for 15 years    Types: Cigarettes    Start date: 12/12/1956    Quit date: 08/18/1972  . Smokeless tobacco: Current User    Types: Snuff     Comment: 1-2 cigs daily x 6 months  . Alcohol Use: No   OB History    No data available      Review of Systems  Constitutional: Negative for appetite change and fatigue.  HENT: Negative for congestion, ear discharge and sinus pressure.        Mouth pain  Eyes: Negative for discharge.  Respiratory: Negative for cough.   Cardiovascular: Negative for chest pain.  Gastrointestinal: Negative for abdominal pain and diarrhea.  Genitourinary: Negative for frequency and hematuria.  Musculoskeletal: Negative for back pain.  Skin: Negative for rash.  Neurological: Positive for weakness. Negative for seizures and headaches.  Psychiatric/Behavioral: Negative for hallucinations.      Allergies  Ciprofloxacin; Aspirin; Penicillins; Propoxyphene n-acetaminophen; and Sulfamethoxazole  Home Medications   Prior to Admission medications   Medication Sig Start Date End Date Taking? Authorizing Provider  acetaminophen (TYLENOL) 500 MG tablet Take 1 tablet (500 mg total) by mouth every 6 (six) hours as needed for mild pain. 12/30/14   Samuella Cota, MD  albuterol (PROVENTIL HFA;VENTOLIN HFA) 108 (90 BASE) MCG/ACT inhaler Inhale 2 puffs into the lungs every 6 (six) hours as needed for wheezing or shortness of breath.     Historical Provider, MD  ALPRAZolam Duanne Moron) 0.5 MG tablet Take 1 tablet (0.5 mg total) by mouth 4 (four) times daily as needed for anxiety or sleep. 12/30/14   Samuella Cota, MD  clopidogrel (  PLAVIX) 75 MG tablet Take 1 tablet (75 mg total) by mouth daily. 09/26/12   Renella Cunas, MD  escitalopram (LEXAPRO) 20 MG tablet Take 20 mg by mouth daily.    Historical Provider, MD  ezetimibe (ZETIA) 10 MG tablet Take 10 mg by mouth daily.    Historical Provider, MD  feeding supplement, RESOURCE BREEZE, (RESOURCE BREEZE) LIQD Take 1 Container by mouth 3 (three) times daily between meals. 12/30/14   Samuella Cota, MD  HYDROcodone-acetaminophen [facility]) 10-325 MG per tablet Take 1 tablet by mouth every 6 (six) hours as needed for moderate pain. 12/30/14   Samuella Cota, MD   lidocaine (XYLOCAINE) 2 % solution Use as directed 15 mLs in the mouth or throat as needed for mouth pain (to be used for up to 10 days starting on 10/12/2014).    Historical Provider, MD  NITROSTAT 0.4 MG SL tablet Place 0.4 mg under the tongue every 5 (five) minutes as needed for chest pain.  07/28/12   Historical Provider, MD  pantoprazole (PROTONIX) 40 MG tablet Take 40 mg by mouth daily.    Historical Provider, MD  zolpidem (AMBIEN) 5 MG tablet Take 1 tablet (5 mg total) by mouth at bedtime. 12/30/14   Samuella Cota, MD   BP 74/50 mmHg  Pulse 65  Temp(Src) 98 F (36.7 C) (Oral)  Resp 17  Ht 5\' 2"  (1.575 m)  Wt 173 lb (78.472 kg)  BMI 31.63 kg/m2  SpO2 100% Physical Exam  Constitutional: She is oriented to person, place, and time. She appears well-developed.  HENT:  Head: Normocephalic.  Dry mucous membranes  Eyes: Conjunctivae and EOM are normal. No scleral icterus.  Neck: Neck supple. No thyromegaly present.  Cardiovascular: Normal rate and regular rhythm.  Exam reveals no gallop and no friction rub.   No murmur heard. Pulmonary/Chest: No stridor. She has no wheezes. She has no rales. She exhibits no tenderness.  Abdominal: She exhibits no distension. There is no tenderness. There is no rebound.  Musculoskeletal: Normal range of motion. She exhibits no edema.  Lymphadenopathy:    She has no cervical adenopathy.  Neurological: She is oriented to person, place, and time. She exhibits normal muscle tone. Coordination normal.  Skin: No rash noted. No erythema.  Psychiatric: She has a normal mood and affect. Her behavior is normal.    ED Course  Procedures (including critical care time) Labs Review Labs Reviewed  CBC WITH DIFFERENTIAL/PLATELET - Abnormal; Notable for the following:    RBC 3.37 (*)    Hemoglobin 9.7 (*)    HCT 29.1 (*)    All other components within normal limits  COMPREHENSIVE METABOLIC PANEL - Abnormal; Notable for the following:    Sodium 130 (*)     CO2 13 (*)    Glucose, Bld 112 (*)    BUN 100 (*)    Creatinine, Ser 6.58 (*)    Calcium 8.8 (*)    Total Protein 6.3 (*)    AST 9 (*)    ALT 10 (*)    GFR calc non Af Amer 6 (*)    GFR calc Af Amer 6 (*)    All other components within normal limits  URINALYSIS, ROUTINE W REFLEX MICROSCOPIC - Abnormal; Notable for the following:    APPearance HAZY (*)    Specific Gravity, Urine >1.030 (*)    All other components within normal limits  I-STAT CG4 LACTIC ACID, ED    Imaging Review Dg Chest Portable  1 View  03/10/2015   CLINICAL DATA:  Hypotension and fatigue. Weakness and fatigue. Symptoms for 1 week.  EXAM: PORTABLE CHEST - 1 VIEW  COMPARISON:  12/23/2014.  FINDINGS: Cardiopericardial silhouette within normal limits. Mediastinal contours normal. Trachea midline. No airspace disease or effusion. Monitoring leads project over the chest. Surgical clips are present at the gastroesophageal junction, possibly for prior fundoplication. Removal of the PICC line seen on the most recent comparison. Probable granulomatous calcifications in the RIGHT hilum and RIGHT lung.  IMPRESSION: No active disease.   Electronically Signed   By: Dereck Ligas M.D.   On: 03/10/2015 17:18     EKG Interpretation   Date/Time:  Monday Mar 10 2015 16:58:08 EDT Ventricular Rate:  60 PR Interval:  216 QRS Duration: 94 QT Interval:  407 QTC Calculation: 407 R Axis:   -6 Text Interpretation:  Sinus rhythm Borderline prolonged PR interval Low  voltage, extremity leads Baseline wander in lead(s) V2 Confirmed by Alayzia Pavlock   MD, Hildreth Orsak [phone]) on 03/10/2015 6:00:25 PM     CRITICAL CARE Performed by: Arsh Feutz L Total critical care time: 40 Critical care time was exclusive of separately billable procedures and treating other patients. Critical care was necessary to treat or prevent imminent or life-threatening deterioration. Critical care was time spent personally by me on the following activities: development of  treatment plan with patient and/or surrogate as well as nursing, discussions with consultants, evaluation of patient's response to treatment, examination of patient, obtaining history from patient or surrogate, ordering and performing treatments and interventions, ordering and review of laboratory studies, ordering and review of radiographic studies, pulse oximetry and re-evaluation of patient's condition.  MDM   Final diagnoses:  Dehydration    Admit for dehydration and renal failure   Milton Ferguson, MD 03/10/15 [phone]

## 2015-03-10 NOTE — ED Notes (Signed)
Pt states that she has been getting weaker and weaker over the past week or so.

## 2015-03-10 NOTE — H&P (Signed)
[facility] History and Physical  KALLEE NAM IRW:[id number] DOB: 16-Apr-1941 DOA: 03/10/2015  Referring physician: ER  PCP: Glo Herring., MD   Chief Complaint: [name] [name]  HPI: [name] [name] is a 74 y.o. female  This 74 yr old lady,with oral cancer and who has not followed up with Oncology at [facility] because of confusion with getting an appointment, now comes in with one week history of weakness,anorexia.No vomiting,dyspnoea.Evaluation shows patient is hypotensive and in ARF.No fever.   Review of Systems:   Apart from symptoms above,all systems negative.  Past Medical History  Diagnosis Date  . Anxiety   . Depression   . Hypertension   . GERD (gastroesophageal reflux disease)   . Hyperlipidemia   . Myocardial infarction   . Stroke     times 2  . Diabetes mellitus without complication   . Cancer    Past Surgical History  Procedure Laterality Date  . Abdominal hysterectomy    . Cholecystectomy    . Oophorectomy    . Colostomy    . Tonsillectomy    . Cataract extraction w/phaco Right 03/20/2013    Procedure: CATARACT EXTRACTION PHACO AND INTRAOCULAR LENS PLACEMENT (IOC);  Surgeon: Elta Guadeloupe T. Gershon Crane, MD;  Location: [facility];  Service: Ophthalmology;  Laterality: Right;  CDE:  6.29    Social History:  reports that she quit smoking about 42 years ago. Her smoking use included Cigarettes. She started smoking about 58 years ago. She has a 3.75 pack-year smoking history. Her smokeless tobacco use includes Snuff. She reports that she does not drink alcohol or use illicit drugs.  Allergies  Allergen Reactions  . Ciprofloxacin Anaphylaxis  . Aspirin Nausea And Vomiting  . Penicillins Nausea And Vomiting  . Propoxyphene N-Acetaminophen Nausea And Vomiting  . Sulfamethoxazole Rash    Family History  Problem Relation Age of Onset  . Cancer Other   . Diabetes Other   . Abnormal EKG Other   . Arthritis Other   . Asthma Other   . Kidney disease  Other     Prior to Admission medications   Medication Sig Start Date End Date Taking? Authorizing Provider  acetaminophen (TYLENOL) 500 MG tablet Take 1 tablet (500 mg total) by mouth every 6 (six) hours as needed for mild pain. 12/30/14  Yes Samuella Cota, MD  albuterol (PROVENTIL HFA;VENTOLIN HFA) 108 (90 BASE) MCG/ACT inhaler Inhale 2 puffs into the lungs every 6 (six) hours as needed for wheezing or shortness of breath.    Yes Historical Provider, MD  ALPRAZolam Duanne Moron) 0.5 MG tablet Take 1 tablet (0.5 mg total) by mouth 4 (four) times daily as needed for anxiety or sleep. 12/30/14  Yes Samuella Cota, MD  clopidogrel (PLAVIX) 75 MG tablet Take 1 tablet (75 mg total) by mouth daily. 09/26/12  Yes Renella Cunas, MD  escitalopram (LEXAPRO) 20 MG tablet Take 20 mg by mouth daily.   Yes Historical Provider, MD  ezetimibe (ZETIA) 10 MG tablet Take 10 mg by mouth daily.   Yes Historical Provider, MD  HYDROcodone-acetaminophen (NORCO) 10-325 MG per tablet Take 1 tablet by mouth every 6 (six) hours as needed for moderate pain. Patient taking differently: Take 0.5-1 tablets by mouth every 6 (six) hours as needed for moderate pain.  12/30/14  Yes Samuella Cota, MD  lidocaine (XYLOCAINE) 2 % solution Use as directed 15 mLs in the mouth or throat as needed for mouth pain (to be used for up to 10 days starting  on 10/12/2014).   Yes Historical Provider, MD  lisinopril (PRINIVIL,ZESTRIL) 2.5 MG tablet Take 2.5 mg by mouth daily.   Yes Historical Provider, MD  metoprolol tartrate (LOPRESSOR) 25 MG tablet Take 25 mg by mouth 2 (two) times daily.   Yes Historical Provider, MD  NITROSTAT 0.4 MG SL tablet Place 0.4 mg under the tongue every 5 (five) minutes as needed for chest pain.  07/28/12  Yes Historical Provider, MD  pantoprazole (PROTONIX) 40 MG tablet Take 40 mg by mouth daily.   Yes Historical Provider, MD  zolpidem (AMBIEN) 5 MG tablet Take 1 tablet (5 mg total) by mouth at bedtime. Patient taking  differently: Take 10 mg by mouth at bedtime.  12/30/14  Yes Samuella Cota, MD  feeding supplement, RESOURCE BREEZE, (RESOURCE BREEZE) LIQD Take 1 Container by mouth 3 (three) times daily between meals. Patient not taking: Reported on 03/10/2015 12/30/14   Samuella Cota, MD   Physical Exam: Filed Vitals:   03/10/15 1642 03/10/15 1645 03/10/15 1730 03/10/15 1800  BP: 69/52 74/38 79/44  74/50  Pulse: 62 65 64 65  Temp:  98 F (36.7 C)    TempSrc:  Oral    Resp:  18 18 17   Height:  5\' 2"  (1.575 m)    Weight:  78.472 kg (173 lb)    SpO2: 100% 100% 100% 100%    Wt Readings from Last 3 Encounters:  03/10/15 78.472 kg (173 lb)  02/19/15 78.472 kg (173 lb)  12/30/14 91.1 kg (200 lb 13.4 oz)    General:  Appears clinically dehydrated.Hypotensive.Alert and orientated. Eyes: PERRL, normal lids, irises & conjunctiva ENT: grossly normal hearing, lips & tongue Neck: no LAD, masses or thyromegaly Cardiovascular: RRR, no m/r/g. No LE edema. Telemetry: SR, no arrhythmias  Respiratory: CTA bilaterally, no w/r/r. Normal respiratory effort. Abdomen: soft, ntnd Skin: no rash or induration seen on limited exam Musculoskeletal: grossly normal tone BUE/BLE Psychiatric: grossly normal mood and affect, speech fluent and appropriate Neurologic: grossly non-focal.          Labs on Admission:  Basic Metabolic Panel:  Recent Labs Lab 03/10/15 1708  NA 130*  K 4.9  CL 107  CO2 13*  GLUCOSE 112*  BUN 100*  CREATININE 6.58*  CALCIUM 8.8*   Liver Function Tests:  Recent Labs Lab 03/10/15 1708  AST 9*  ALT 10*  ALKPHOS 75  BILITOT 0.3  PROT 6.3*  ALBUMIN 3.5   No results for input(s): LIPASE, AMYLASE in the last 168 hours. No results for input(s): AMMONIA in the last 168 hours. CBC:  Recent Labs Lab 03/10/15 1708  WBC 9.6  NEUTROABS 6.8  HGB 9.7*  HCT 29.1*  MCV 86.4  PLT 235   Cardiac Enzymes: No results for input(s): CKTOTAL, CKMB, CKMBINDEX, TROPONINI in the last  168 hours.  BNP (last 3 results) No results for input(s): BNP in the last 8760 hours.  ProBNP (last 3 results) No results for input(s): PROBNP in the last 8760 hours.  CBG: No results for input(s): GLUCAP in the last 168 hours.  Radiological Exams on Admission: Dg Chest Portable 1 View  03/10/2015   CLINICAL DATA:  Hypotension and fatigue. Weakness and fatigue. Symptoms for 1 week.  EXAM: PORTABLE CHEST - 1 VIEW  COMPARISON:  12/23/2014.  FINDINGS: Cardiopericardial silhouette within normal limits. Mediastinal contours normal. Trachea midline. No airspace disease or effusion. Monitoring leads project over the chest. Surgical clips are present at the gastroesophageal junction, possibly for prior fundoplication. Removal  of the PICC line seen on the most recent comparison. Probable granulomatous calcifications in the RIGHT hilum and RIGHT lung.  IMPRESSION: No active disease.   Electronically Signed   By: Dereck Ligas M.D.   On: 03/10/2015 17:18      Assessment/Plan   1. Dehydration.Start aggressive iv fluids. 2. Hypotension.Secondary to hypovolemia.Iv fluids ,admit to stepdown. 3. Acute on chronic renal failure.Baseline creatinine is around 2.0,now it is 6.58. 4. Oral cancer.Will need follow up with Oncology.  Further recommendations will depend on progress.   Code Status: Full  DVT Prophylaxis:Lovenox  Family Communication: Discussed plan with patient at bedside.   Disposition Plan: Home when medically stable.   Time spent: 60 minutes  [facility] Pager [phone]

## 2015-03-10 NOTE — ED Notes (Signed)
Pt reports that her bp has been running low for a while now.  Daughter states that when she goes to the doctor it is usually around 90 systolic.  Reports that she has spoken with pcp about changing or discontinuing bp meds but he stated to keep taking them.  States pt gets weak and dizzy at times and that this has been going on for months now.  Pt is alert and oriented and asymptomatic of bp at this time.

## 2015-03-11 DIAGNOSIS — G9340 Encephalopathy, unspecified: Secondary | ICD-10-CM

## 2015-03-11 LAB — CBC
HEMATOCRIT: 30.5 % — AB (ref 36.0–46.0)
Hemoglobin: 10.3 g/dL — ABNORMAL LOW (ref 12.0–15.0)
MCH: 28.8 pg (ref 26.0–34.0)
MCHC: 33.8 g/dL (ref 30.0–36.0)
MCV: 85.2 fL (ref 78.0–100.0)
Platelets: 245 10*3/uL (ref 150–400)
RBC: 3.58 MIL/uL — ABNORMAL LOW (ref 3.87–5.11)
RDW: 13.5 % (ref 11.5–15.5)
WBC: 9.4 10*3/uL (ref 4.0–10.5)

## 2015-03-11 LAB — COMPREHENSIVE METABOLIC PANEL
ALBUMIN: 3.3 g/dL — AB (ref 3.5–5.0)
ALT: 9 U/L — ABNORMAL LOW (ref 14–54)
ANION GAP: 9 (ref 5–15)
AST: 8 U/L — AB (ref 15–41)
Alkaline Phosphatase: 78 U/L (ref 38–126)
BILIRUBIN TOTAL: 0.5 mg/dL (ref 0.3–1.2)
BUN: 91 mg/dL — ABNORMAL HIGH (ref 6–20)
CHLORIDE: 112 mmol/L — AB (ref 101–111)
CO2: 14 mmol/L — ABNORMAL LOW (ref 22–32)
Calcium: 8.8 mg/dL — ABNORMAL LOW (ref 8.9–10.3)
Creatinine, Ser: 5.43 mg/dL — ABNORMAL HIGH (ref 0.44–1.00)
GFR calc Af Amer: 8 mL/min — ABNORMAL LOW (ref >60)
GFR calc non Af Amer: 7 mL/min — ABNORMAL LOW (ref >60)
Glucose, Bld: 151 mg/dL — ABNORMAL HIGH (ref 70–99)
Potassium: 4.1 mmol/L (ref 3.5–5.1)
Sodium: 135 mmol/L (ref 135–145)
TOTAL PROTEIN: 6 g/dL — AB (ref 6.5–8.1)

## 2015-03-11 MED ORDER — BOOST / RESOURCE BREEZE PO LIQD
1.0000 | Freq: Two times a day (BID) | ORAL | Status: DC
  Administered 2015-03-11 – 2015-03-16 (×11): 1 via ORAL

## 2015-03-11 MED ORDER — SODIUM BICARBONATE 8.4 % IV SOLN
INTRAVENOUS | Status: DC
  Administered 2015-03-11 – 2015-03-12 (×2): via INTRAVENOUS
  Filled 2015-03-11 (×4): qty 150

## 2015-03-11 NOTE — Discharge Instructions (Signed)
Dehydration Dehydration means your body does not have as much fluid as it needs. Your kidneys, brain, and heart will not work properly without the right amount of fluids and salt. Older adults are more likely to become dehydrated than younger adults. This is because:   Their bodies do not hold water as well.  Their bodies do not respond to temperature changes as well.  They do not get thirsty as easily or as quickly. HOME CARE  Ask your doctor how to replace body fluid losses (rehydrate).  Drink enough fluids to keep your pee (urine) clear or pale yellow.  Drink small amounts of fluids often if you feel sick to your stomach (nauseous) or throw up (vomit).  Eat like you normally do.  Avoid:  Foods or drinks high in sugar.  Bubbly (carbonated) drinks.  Juice.  Very hot or cold fluids.  Drinks with caffeine.  Fatty, greasy foods.  Alcohol.  Tobacco.  Eating too much.  Gelatin desserts.  Wash your hands to avoid spreading germs (bacteria, viruses).  Only take medicine as told by your doctor.  Keep all doctor visits as told. GET HELP IF:  You have belly (abdominal) pain that gets worse or stays in one spot (localizes).  You have a rash, stiff neck, or bad headache.  You get easily annoyed, sleepy, or are hard to wake up.  You feel weak, dizzy, or very thirsty.  You have a fever. GET HELP RIGHT AWAY IF:   You cannot drink fluid without throwing up.  You get worse even with treatment.  You throw up often.  You have watery poop (diarrhea) often.  Your vomit has blood in it or looks greenish.  Your poop (stool) has blood in it or looks black and tarry.  You have not peed in 6 to 8 hours or have only peed a small amount of very dark pee.  You pass out (faint). MAKE SURE YOU:   Understand these instructions.  Will watch your condition.  Will get help right away if you are not doing well or get worse. Document Released: 10/14/2011 Document Revised:  10/30/2013 Document Reviewed: 07/02/2013 Exit[employer] Patient Information 2015 [employer]. This information is not intended to replace advice given to you by your health care provider. Make sure you discuss any questions you have with your health care provider.  

## 2015-03-11 NOTE — Consult Note (Signed)
Reason for Consult: Acute kidney injury Referring Physician: Dr. Shanon Brow Tat  [name] [name] is an 74 y.o. female.  HPI: She is a patient who has history of her chronic failure stage IV, diabetes, hypertension, oral cancer and colostomy. Presently patient came with complaints of poor appetite, weakness for about a couple of days duration. She has some nausea but no vomiting. Patient also denies any difficulty in breathing. Patient denies any swallowing problem.  Past Medical History  Diagnosis Date  . Anxiety   . Depression   . Hypertension   . GERD (gastroesophageal reflux disease)   . Hyperlipidemia   . Myocardial infarction   . Stroke     times 2  . Diabetes mellitus without complication   . Cancer     Past Surgical History  Procedure Laterality Date  . Abdominal hysterectomy    . Cholecystectomy    . Oophorectomy    . Colostomy    . Tonsillectomy    . Cataract extraction w/phaco Right 03/20/2013    Procedure: CATARACT EXTRACTION PHACO AND INTRAOCULAR LENS PLACEMENT (IOC);  Surgeon: Elta Guadeloupe T. Gershon Crane, MD;  Location: [facility];  Service: Ophthalmology;  Laterality: Right;  CDE:  6.29     Family History  Problem Relation Age of Onset  . Cancer Other   . Diabetes Other   . Abnormal EKG Other   . Arthritis Other   . Asthma Other   . Kidney disease Other     Social History:  reports that she quit smoking about 42 years ago. Her smoking use included Cigarettes. She started smoking about 58 years ago. She has a 3.75 pack-year smoking history. Her smokeless tobacco use includes Snuff. She reports that she does not drink alcohol or use illicit drugs.  Allergies:  Allergies  Allergen Reactions  . Ciprofloxacin Anaphylaxis  . Aspirin Nausea And Vomiting  . Penicillins Nausea And Vomiting  . Propoxyphene N-Acetaminophen Nausea And Vomiting  . Sulfamethoxazole Rash    Medications: I have reviewed the patient's current medications.  Results for orders placed or performed  during the hospital encounter of 03/10/15 (from the past 48 hour(s))  CBC with Differential/Platelet     Status: Abnormal   Collection Time: 03/10/15  5:08 PM  Result Value Ref Range   WBC 9.6 4.0 - 10.5 K/uL   RBC 3.37 (L) 3.87 - 5.11 MIL/uL   Hemoglobin 9.7 (L) 12.0 - 15.0 g/dL   HCT 29.1 (L) 36.0 - 46.0 %   MCV 86.4 78.0 - 100.0 fL   MCH 28.8 26.0 - 34.0 pg   MCHC 33.3 30.0 - 36.0 g/dL   RDW 13.6 11.5 - 15.5 %   Platelets 235 150 - 400 K/uL   Neutrophils Relative % 71 43 - 77 %   Neutro Abs 6.8 1.7 - 7.7 K/uL   Lymphocytes Relative 21 12 - 46 %   Lymphs Abs 2.0 0.7 - 4.0 K/uL   Monocytes Relative 7 3 - 12 %   Monocytes Absolute 0.6 0.1 - 1.0 K/uL   Eosinophils Relative 1 0 - 5 %   Eosinophils Absolute 0.1 0.0 - 0.7 K/uL   Basophils Relative 0 0 - 1 %   Basophils Absolute 0.0 0.0 - 0.1 K/uL  Comprehensive metabolic panel     Status: Abnormal   Collection Time: 03/10/15  5:08 PM  Result Value Ref Range   Sodium 130 (L) 135 - 145 mmol/L   Potassium 4.9 3.5 - 5.1 mmol/L   Chloride 107 101 -  111 mmol/L   CO2 13 (L) 22 - 32 mmol/L   Glucose, Bld 112 (H) 70 - 99 mg/dL   BUN 100 (H) 6 - 20 mg/dL    Comment: RESULTS CONFIRMED BY MANUAL DILUTION   Creatinine, Ser 6.58 (H) 0.44 - 1.00 mg/dL   Calcium 8.8 (L) 8.9 - 10.3 mg/dL   Total Protein 6.3 (L) 6.5 - 8.1 g/dL   Albumin 3.5 3.5 - 5.0 g/dL   AST 9 (L) 15 - 41 U/L   ALT 10 (L) 14 - 54 U/L   Alkaline Phosphatase 75 38 - 126 U/L   Total Bilirubin 0.3 0.3 - 1.2 mg/dL   GFR calc non Af Amer 6 (L) >60 mL/min   GFR calc Af Amer 6 (L) >60 mL/min    Comment: (NOTE) The eGFR has been calculated using the CKD EPI equation. This calculation has not been validated in all clinical situations. eGFR's persistently <90 mL/min signify possible Chronic Kidney Disease.    Anion gap 10 5 - 15  I-Stat CG4 Lactic Acid, ED     Status: None   Collection Time: 03/10/15  5:14 PM  Result Value Ref Range   Lactic Acid, Venous 0.61 0.5 - 2.0 mmol/L   Urinalysis, Routine w reflex microscopic     Status: Abnormal   Collection Time: 03/10/15  5:15 PM  Result Value Ref Range   Color, Urine YELLOW YELLOW   APPearance HAZY (A) CLEAR   Specific Gravity, Urine >1.030 (H) 1.005 - 1.030   pH 5.0 5.0 - 8.0   Glucose, UA NEGATIVE NEGATIVE mg/dL   Hgb urine dipstick NEGATIVE NEGATIVE   Bilirubin Urine NEGATIVE NEGATIVE   Ketones, ur NEGATIVE NEGATIVE mg/dL   Protein, ur NEGATIVE NEGATIVE mg/dL   Urobilinogen, UA 0.2 0.0 - 1.0 mg/dL   Nitrite NEGATIVE NEGATIVE   Leukocytes, UA NEGATIVE NEGATIVE    Comment: MICROSCOPIC NOT DONE ON URINES WITH NEGATIVE PROTEIN, BLOOD, LEUKOCYTES, NITRITE, OR GLUCOSE <1000 mg/dL.  MRSA PCR Screening     Status: None   Collection Time: 03/10/15  7:53 PM  Result Value Ref Range   MRSA by PCR NEGATIVE NEGATIVE    Comment:        The GeneXpert MRSA Assay (FDA approved for NASAL specimens only), is one component of a comprehensive MRSA colonization surveillance program. It is not intended to diagnose MRSA infection nor to guide or monitor treatment for MRSA infections.   Basic metabolic panel     Status: Abnormal   Collection Time: 03/10/15  9:45 PM  Result Value Ref Range   Sodium 134 (L) 135 - 145 mmol/L   Potassium 4.5 3.5 - 5.1 mmol/L   Chloride 113 (H) 101 - 111 mmol/L   CO2 11 (L) 22 - 32 mmol/L   Glucose, Bld 93 70 - 99 mg/dL   BUN 93 (H) 6 - 20 mg/dL   Creatinine, Ser 6.02 (H) 0.44 - 1.00 mg/dL   Calcium 8.5 (L) 8.9 - 10.3 mg/dL   GFR calc non Af Amer 6 (L) >60 mL/min   GFR calc Af Amer 7 (L) >60 mL/min    Comment: (NOTE) The eGFR has been calculated using the CKD EPI equation. This calculation has not been validated in all clinical situations. eGFR's persistently <90 mL/min signify possible Chronic Kidney Disease.    Anion gap 10 5 - 15  Comprehensive metabolic panel     Status: Abnormal   Collection Time: 03/11/15  5:27 AM  Result Value Ref Range  Sodium 135 135 - 145 mmol/L    Potassium 4.1 3.5 - 5.1 mmol/L   Chloride 112 (H) 101 - 111 mmol/L   CO2 14 (L) 22 - 32 mmol/L   Glucose, Bld 151 (H) 70 - 99 mg/dL   BUN 91 (H) 6 - 20 mg/dL   Creatinine, Ser 5.43 (H) 0.44 - 1.00 mg/dL   Calcium 8.8 (L) 8.9 - 10.3 mg/dL   Total Protein 6.0 (L) 6.5 - 8.1 g/dL   Albumin 3.3 (L) 3.5 - 5.0 g/dL   AST 8 (L) 15 - 41 U/L   ALT 9 (L) 14 - 54 U/L   Alkaline Phosphatase 78 38 - 126 U/L   Total Bilirubin 0.5 0.3 - 1.2 mg/dL   GFR calc non Af Amer 7 (L) >60 mL/min   GFR calc Af Amer 8 (L) >60 mL/min    Comment: (NOTE) The eGFR has been calculated using the CKD EPI equation. This calculation has not been validated in all clinical situations. eGFR's persistently <90 mL/min signify possible Chronic Kidney Disease.    Anion gap 9 5 - 15  CBC     Status: Abnormal   Collection Time: 03/11/15  5:27 AM  Result Value Ref Range   WBC 9.4 4.0 - 10.5 K/uL   RBC 3.58 (L) 3.87 - 5.11 MIL/uL   Hemoglobin 10.3 (L) 12.0 - 15.0 g/dL   HCT 30.5 (L) 36.0 - 46.0 %   MCV 85.2 78.0 - 100.0 fL   MCH 28.8 26.0 - 34.0 pg   MCHC 33.8 30.0 - 36.0 g/dL   RDW 13.5 11.5 - 15.5 %   Platelets 245 150 - 400 K/uL    Dg Chest Portable 1 View  03/10/2015   CLINICAL DATA:  Hypotension and fatigue. Weakness and fatigue. Symptoms for 1 week.  EXAM: PORTABLE CHEST - 1 VIEW  COMPARISON:  12/23/2014.  FINDINGS: Cardiopericardial silhouette within normal limits. Mediastinal contours normal. Trachea midline. No airspace disease or effusion. Monitoring leads project over the chest. Surgical clips are present at the gastroesophageal junction, possibly for prior fundoplication. Removal of the PICC line seen on the most recent comparison. Probable granulomatous calcifications in the RIGHT hilum and RIGHT lung.  IMPRESSION: No active disease.   Electronically Signed   By: Dereck Ligas M.D.   On: 03/10/2015 17:18    Review of Systems  Constitutional: Positive for malaise/fatigue. Negative for fever.  Respiratory:  Negative for shortness of breath.   Cardiovascular: Negative for orthopnea.  Gastrointestinal: Positive for nausea. Negative for vomiting and abdominal pain.  Neurological: Positive for weakness.   Blood pressure 101/32, pulse 69, temperature 98.3 F (36.8 C), temperature source Oral, resp. rate 13, height 5' 2.01" (1.575 m), weight 78.472 kg (173 lb), SpO2 97 %. Physical Exam  Constitutional: She is oriented to person, place, and time. No distress.  Cardiovascular: Normal rate and regular rhythm.   Respiratory: No respiratory distress. She has no wheezes. She has no rales.  GI: She exhibits no distension. There is no tenderness.  Musculoskeletal: She exhibits no edema.  Neurological: She is alert and oriented to person, place, and time.    Assessment/Plan: Problem #1 acute kidney injury: Most likely prerenal syndrome from poor by mouth intake and also fluid loss from her colostomy. Presently patient seems to be feeling better. Patient is nonoliguric and BUN and creatinine is improving. Problem #2 low CO2 possibly metabolic. Her CO2 is improving. Presently she is on sodium bicarbonate Problem #3 chronic renal failure: Patient  has chronic renal failure seems thousand 11 and recently when she was admitted she had similar acute kidney injury superimposed on chronic. Improved. Her baseline creatinine seems to be between 2-2.2. She has underlying stage IV chronic renal failure. Problem #4 history of hypotension most likely from dehydration. Her blood pressure is somewhat better. Problem #5 history of CVA Problem #6 history of diabetes Problem #7 history of anemia: Her hemoglobin was no target goal. Problem #8 history of oral cancer Plan: Agree with hydration We'll check her basic metabolic panel and CBC in the morning. Once patient is adequately hydrated if her urine output doesn't improve we'll try some diuretics in the morning.  Nevaan Bunton S 03/11/2015, 3:42 PM

## 2015-03-11 NOTE — Care Management Note (Signed)
Case Management Note  Patient Details  Name: [name] [name] MRN: [id number] Date of Birth: December 19, 1940  Subjective/Objective:                  Pt is from home, lives alone but has support from daughter. Pt has cane, walker and wheelchair at home but mostly uses walker. Pt has had [facility] in the past and would like them again. Anticipate need for [facility] RN, Aid, PT at discharge. Romualdo Bolk, of [facility] made aware of referral and will obtain pt info from chart. Will cont to follow for CM needs.   Action/Plan:   Expected Discharge Date:  03/14/15               Expected Discharge Plan:  Portal  In-House Referral:  NA  Discharge planning Services  CM Consult  Post Acute Care Choice:  Home Health Choice offered to:  Patient  DME Arranged:    DME Agency:     HH Arranged:  RN, PT, Nurse's Aide [place] Agency:  [place]  Status of Service:  In process, will continue to follow  Medicare Important Message Given:    Date Medicare IM Given:    Medicare IM give by:    Date Additional Medicare IM Given:    Additional Medicare Important Message give by:     If discussed at [place] of Stay Meetings, dates discussed:    Additional Comments:  Sherald Barge, RN 03/11/2015, 2:17 PM

## 2015-03-11 NOTE — Progress Notes (Signed)
INITIAL NUTRITION ASSESSMENT  DOCUMENTATION CODES Per approved criteria  -Obesity Unspecified   INTERVENTION: -Resource Breeze po BID, each supplement provides 250 kcal and 9 grams of protein  -Monitor oral intake and add snacks/supplements as warranted  -Recommend MVI, she did not take one at home   NUTRITION DIAGNOSIS: Increased protein needs related to weight/muscle maintenance in setting of chronic illness as evidenced by having cancer.   Goal: Pt to meet >/= 90% of their estimated nutrition needs   Monitor:  Oral intake, snack/supp preference, labs  Reason for Assessment: MST  74 y.o. female  Admitting Dx: <principal problem not specified>  ASSESSMENT: 74 yr old lady,with PMHx HTN, depression, anxiety, cva, MI, DM, and recent oral cancer comes in with one week history of weakness,anorexia Evaluation shows patient is hypotensive and in ARF  Pt still seems to have altered mental status and was hard to understand at times.   She reports her appetite is the same right now as it has always been. I brought up that she has lost about 22 lbs in the last 3 months and asked her why, but didn't respond. She says she eats 2 meals a day. She reports no v/c/d, but reports having gagging episodes for the past couple months. She did not take any vit/min supplements at home.   She does not know what her normal weight is.   She declined snacks but was open to trying resource breeze.   Nutrition Focused Physical Exam: No wasting noted   Height: Ht Readings from Last 1 Encounters:  03/11/15 5' 2.01" (1.575 m)    Weight: Wt Readings from Last 1 Encounters:  03/11/15 173 lb (78.472 kg)    Ideal Body Weight: 110 lbs  % Ideal Body Weight: 157%  Wt Readings from Last 10 Encounters:  03/11/15 173 lb (78.472 kg)  02/19/15 173 lb (78.472 kg)  12/30/14 200 lb 13.4 oz (91.1 kg)  11/27/14 143 lb (64.864 kg)  10/21/14 194 lb 0.1 oz (88 kg)  10/21/14 194 lb (87.998 kg)  10/18/14  186 lb (84.369 kg)  03/10/14 186 lb 4.6 oz (84.5 kg)  03/20/13 209 lb (94.802 kg)  11/27/12 209 lb 1.9 oz (94.856 kg)  2/22 weight d/t agressive rehydration-Wt likely not accurate. Admit weight for that admission 195 lbs. Pt has lost 22 lbs in 3 months-significant  Usual Body Weight: Unable to determine  BMI:  Body mass index is 31.63 kg/(m^2).  Estimated Nutritional Needs: Kcal: 1550-1650 (20-21 kcal/kg) Protein: 60-70 (1.2-1.4 kcal/kg) Fluid: Per md  Skin: WDL  Diet Order: Diet regular Room service appropriate?: Yes; Fluid consistency:: Thin  EDUCATION NEEDS: -No education needs identified at this time   Intake/Output Summary (Last 24 hours) at 03/11/15 1240 Last data filed at 03/11/15 0536  Gross per 24 hour  Intake 724.76 ml  Output    545 ml  Net 179.76 ml    Last WK:[id number]  Labs:   Recent Labs Lab 03/10/15 1708 03/10/15 2145 03/11/15 0527  NA 130* 134* 135  K 4.9 4.5 4.1  CL 107 113* 112*  CO2 13* 11* 14*  BUN 100* 93* 91*  CREATININE 6.58* 6.02* 5.43*  CALCIUM 8.8* 8.5* 8.8*  GLUCOSE 112* 93 151*    CBG (last 3)  No results for input(s): GLUCAP in the last 72 hours.  Scheduled Meds: . clopidogrel  75 mg Oral Daily  . enoxaparin (LOVENOX) injection  30 mg Subcutaneous Q24H  . escitalopram  20 mg Oral Daily  . ezetimibe  10 mg Oral Daily  . pantoprazole  40 mg Oral Daily  . sodium chloride  3 mL Intravenous Q12H    Continuous Infusions: . DOPamine 7.5 mcg/kg/min (03/11/15 0200)  .  sodium bicarbonate  infusion 1000 mL Stopped (03/11/15 1100)    Past Medical History  Diagnosis Date  . Anxiety   . Depression   . Hypertension   . GERD (gastroesophageal reflux disease)   . Hyperlipidemia   . Myocardial infarction   . Stroke     times 2  . Diabetes mellitus without complication   . Cancer     Past Surgical History  Procedure Laterality Date  . Abdominal hysterectomy    . Cholecystectomy    . Oophorectomy    . Colostomy    .  Tonsillectomy    . Cataract extraction w/phaco Right 03/20/2013    Procedure: CATARACT EXTRACTION PHACO AND INTRAOCULAR LENS PLACEMENT (IOC);  Surgeon: Elta Guadeloupe T. Gershon Crane, MD;  Location: [facility];  Service: Ophthalmology;  Laterality: Right;  CDE:  6.29     Burtis Junes RD, LDN Nutrition Pager: [phone] 03/11/2015 12:40 PM

## 2015-03-11 NOTE — Evaluation (Signed)
Clinical/Bedside Swallow Evaluation Patient Details  Name: [name] [name] MRN: [id number] Date of Birth: June 18, 1941  Today's Date: 03/11/2015 Time: SLP Start Time (ACUTE ONLY): 1920 SLP Stop Time (ACUTE ONLY): 2010 SLP Time Calculation (min) (ACUTE ONLY): 50 min  Past Medical History:  Past Medical History  Diagnosis Date  . Anxiety   . Depression   . Hypertension   . GERD (gastroesophageal reflux disease)   . Hyperlipidemia   . Myocardial infarction   . Stroke     times 2  . Diabetes mellitus without complication   . Cancer    Past Surgical History:  Past Surgical History  Procedure Laterality Date  . Abdominal hysterectomy    . Cholecystectomy    . Oophorectomy    . Colostomy    . Tonsillectomy    . Cataract extraction w/phaco Right 03/20/2013    Procedure: CATARACT EXTRACTION PHACO AND INTRAOCULAR LENS PLACEMENT (IOC);  Surgeon: Elta Guadeloupe T. 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 The patient recently had laser excision of her oral lesions on 10/11/2014 at [facility]. Unfortunately, she has missed a number of appointments since then. The patient is somewhat somnolent but awakens to examine and is able to answer basic questions. Upon admission, the patient was noted to be hypotensive with acute on chronic renal failure. The patient was started on a bicarbonate drip and dopamine and admitted to ICU. Per interview with pt'd daughter, the pt was supposed to follow up with Dr. Nicolette Bang, ENT at [facility] for treatment of her oral cancer but she has not been since December. Her appetite has been very poor and limited due to pain/burning in oral cavity. SLP asked to  evaluate swallow.   Assessment / Plan / Recommendation Clinical Impression  [name] presents with oral phase dysphagia in setting of oral cancer and odynophagia. Pt initially refused to open her mouth, show me her tongue, or take po, but did eventually after encouragement from her daughter. Daughter reports that pt has not been eating/drinking well for several months following the laser treatment for her oral cancer in Decemeber 2015 with Dr. Nicolette Bang. She was supposed to resume care with Dr. Nicolette Bang, however the patient has not returned for follow up (family stated because the pt was "too dehydrated" and pt is "very stubborn"). SLP suggested that pt may need temporary feeding tube until treatment is complete if pt unwilling/unable to try and eat due to pain. Pt shook head no and then was agreeable to ice cream, water, jello, and Ensure. She did well when SLP gently placed bolus on tongue (avoiding lower lip) and with use of straw for liquids. She shows no signs or symptoms of aspiration and vocal quality remains clear. Pt does have clear, runny nose which family states this happens whenever she gets "rehydrated". It is understandable that pt is dehydrated and will only become more so if it is too painful for her to eat/drink. It is paramount that she follow up with Dr. Nicolette Bang (phone is [phone]) to continue treatment or oral cancer. Recommend downgrading diet to D2/chopped with thin liquids; suggest Magic Cups and Boost/Ensure on tray if allowed for dietary purposes. If patient is discharged home or to  SNF from this hospital, please call and schedule follow up appointment with Dr. Francina Ames (phone: [phone] and fax: [phone]) for patient otherwise it may never happen and she will likely end up back in the hospital again. SLP will follow while in acute setting.    Aspiration Risk  None    Diet Recommendation Dysphagia 2 (Fine chop);Thin   Medication Administration: Whole meds  with liquid Compensations: Small sips/bites;Slow rate    Other  Recommendations Recommended Consults: Consider ENT evaluation (F/U with Dr. Francina Ames) Oral Care Recommendations: Oral care QID;Staff/trained caregiver to provide oral care Other Recommendations: Clarify dietary restrictions   Follow Up Recommendations       Frequency and Duration    1 week   Pertinent Vitals/Pain VSS   SLP Swallow Goals   Pt will demonstrate safe and efficient consumption of least restrictive diet with use of strategies as needed.    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 The patient recently had laser excision of her oral lesions on 10/11/2014 at [facility]. Unfortunately, she has missed a number of appointments since then. The patient is somewhat somnolent but awakens to examine and is able to answer basic questions. Upon admission, the patient was noted to be hypotensive with acute on chronic renal failure. The patient was started on a bicarbonate drip and dopamine and admitted to ICU. Per interview with pt'd daughter, the pt was supposed to follow up with Dr. Nicolette Bang, ENT at [facility] for treatment of her oral cancer but she has not been since December. Her appetite has been very poor and limited due to pain/burning in oral cavity. SLP asked to evaluate swallow. Type of Study: Bedside swallow evaluation Previous Swallow Assessment: None on record Diet Prior to this Study: Regular;Thin liquids Temperature Spikes Noted: No Respiratory Status: Room air History of Recent Intubation: No Behavior/Cognition: Alert;Cooperative;Pleasant mood Oral Cavity - Dentition:  Edentulous Self-Feeding Abilities: Able to feed self Patient Positioning: Upright in bed Baseline Vocal Quality: Normal Volitional Cough: Strong Volitional Swallow: Able to elicit    Oral/Motor/Sensory Function Overall Oral Motor/Sensory Function: Impaired Labial ROM: Reduced right;Reduced left Labial Symmetry: Within Functional Limits Labial Strength: Reduced Labial Sensation: Within Functional Limits Lingual ROM: Within Functional Limits Lingual Symmetry: Within Functional Limits Lingual Strength: Within Functional Limits Lingual Sensation: Within Functional Limits Facial ROM: Within Functional Limits Facial Symmetry: Within Functional Limits Facial Strength: Within Functional Limits Facial Sensation: Within Functional Limits Velum:  (could not visualize) Mandible: Impaired   Ice Chips Ice chips:  (Pt refused)   Thin Liquid Thin Liquid: Impaired Presentation: Straw Oral Phase Impairments: Reduced labial seal    Nectar Thick Nectar Thick Liquid: Within functional limits Presentation: Straw   Honey Thick Honey Thick Liquid: Not tested   Puree Puree:  (pt refused)   Solid   Thank you,  Genene Churn, CCC-SLP [phone]     Solid: Not tested       Martice Doty 03/11/2015,8:46 PM

## 2015-03-11 NOTE — Progress Notes (Addendum)
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 The patient recently had laser excision of her oral lesions on 10/11/2014 at [facility]. Unfortunately, she has missed a number of appointments since then. The patient is somewhat somnolent but awakens to examine and is able to answer basic questions. Upon admission, the patient was noted to be hypotensive with acute on chronic renal failure. The patient was started on a bicarbonate drip and dopamine and admitted to ICU. Assessment/Plan: Acute on chronic renal failure (CKD 3-4) -Secondary to volume depletion in the setting of ACE inhibitor use -Continue bicarbonate drip -Consult nephrology -Check magnesium and phosphorus Urinalysis without pyuria or proteinuria or hematuria- Dehydration/hypotension -Secondary to fluid depletion -Aggressive fluid resuscitation -Urinalysis negative for pyuria or proteinuria -Lactic acid 0.61 -EKG sinus rhythm with nonspecific T-wave changes -Chest x-ray negative for pulmonary edema or consolidation  Metabolic acidosis -Secondary to acute on chronic renal failure -Continue bicarbonate drip Dysphagia -Speech therapy to evaluate given the patient's history of oropharyngeal squamous cell carcinoma and complains of dysphagia  Metabolic encephalopathy -Secondary to renal failure and electrolyte derangement -Gradually improving Squamous cell carcinoma of the oral cavity -Patient needs follow-up at [facility] with her otolaryngologist Depression -Continue Xanax and Lexapro Hypertension history -Hold lisinopril and metoprolol tartrate Anemia of chronic disease -Hemoglobin at  baseline -Baseline hemoglobin 9-10   Communication: no family at bedside Disposition Plan:   Home when medically stable     Procedures/Studies: Dg Chest Portable 1 View  03/10/2015   CLINICAL DATA:  Hypotension and fatigue. Weakness and fatigue. Symptoms for 1 week.  EXAM: PORTABLE CHEST - 1 VIEW  COMPARISON:  12/23/2014.  FINDINGS: Cardiopericardial silhouette within normal limits. Mediastinal contours normal. Trachea midline. No airspace disease or effusion. Monitoring leads project over the chest. Surgical clips are present at the gastroesophageal junction, possibly for prior fundoplication. Removal of the PICC line seen on the most recent comparison. Probable granulomatous calcifications in the RIGHT hilum and RIGHT lung.  IMPRESSION: No active disease.   Electronically Signed   By: Dereck Ligas M.D.   On: 03/10/2015 17:18         Subjective: Patient denies fevers, chills, headache, chest pain, dyspnea, nausea, vomiting, diarrhea, abdominal pain, dysuria, hematuria   Objective: Filed Vitals:   03/11/15 0500 03/11/15 0535 03/11/15 0600 03/11/15 0700  BP: 100/45  99/46 101/45  Pulse: 65  64 65  Temp:  98.5 F (36.9 C)    TempSrc:  Oral    Resp: 14  13 13   Height:      Weight:  80.8 kg (178 lb 2.1 oz)    SpO2: 98%  95% 99%    Intake/Output Summary (Last 24 hours) at 03/11/15 0742 Last data filed at 03/11/15 0536  Gross per 24 hour  Intake 724.76 ml  Output    545 ml  Net 179.76 ml   Weight change:  Exam:   General:  Pt is alert, follows commands appropriately, not in acute distress  HEENT: No icterus, No thrush,  [place]/AT. Oropharynx with scattered white plaques  Cardiovascular: RRR, S1/S2, no rubs, no gallops  Respiratory: CTA bilaterally, no wheezing, no crackles, no rhonchi  Abdomen: Soft/+BS, non tender, non distended, no guarding  Extremities: trace LE edema, No lymphangitis, No petechiae, No rashes, no synovitis  Data Reviewed: Basic Metabolic  Panel:  Recent Labs Lab 03/10/15 1708 03/10/15 2145 03/11/15 0527  NA 130* 134* 135  K 4.9 4.5 4.1  CL 107 113* 112*  CO2 13* 11* 14*  GLUCOSE 112* 93 151*  BUN 100* 93* 91*  CREATININE 6.58* 6.02* 5.43*  CALCIUM 8.8* 8.5* 8.8*   Liver Function Tests:  Recent Labs Lab 03/10/15 1708 03/11/15 0527  AST 9* 8*  ALT 10* 9*  ALKPHOS 75 78  BILITOT 0.3 0.5  PROT 6.3* 6.0*  ALBUMIN 3.5 3.3*   No results for input(s): LIPASE, AMYLASE in the last 168 hours. No results for input(s): AMMONIA in the last 168 hours. CBC:  Recent Labs Lab 03/10/15 1708 03/11/15 0527  WBC 9.6 9.4  NEUTROABS 6.8  --   HGB 9.7* 10.3*  HCT 29.1* 30.5*  MCV 86.4 85.2  PLT 235 245   Cardiac Enzymes: No results for input(s): CKTOTAL, CKMB, CKMBINDEX, TROPONINI in the last 168 hours. BNP: Invalid input(s): POCBNP CBG: No results for input(s): GLUCAP in the last 168 hours.  Recent Results (from the past 240 hour(s))  MRSA PCR Screening     Status: None   Collection Time: 03/10/15  7:53 PM  Result Value Ref Range Status   MRSA by PCR NEGATIVE NEGATIVE Final    Comment:        The GeneXpert MRSA Assay (FDA approved for NASAL specimens only), is one component of a comprehensive MRSA colonization surveillance program. It is not intended to diagnose MRSA infection nor to guide or monitor treatment for MRSA infections.      Scheduled Meds: . clopidogrel  75 mg Oral Daily  . enoxaparin (LOVENOX) injection  30 mg Subcutaneous Q24H  . escitalopram  20 mg Oral Daily  . ezetimibe  10 mg Oral Daily  . pantoprazole  40 mg Oral Daily  . sodium chloride  3 mL Intravenous Q12H   Continuous Infusions: . DOPamine 7.5 mcg/kg/min (03/11/15 0200)  .  sodium bicarbonate  infusion 1000 mL 100 mL/hr at 03/11/15 0200     [name] Efaw, DO  [facility] Pager [phone]  If 7PM-7AM, please contact night-coverage [email or web address] Password [email or web address] 03/11/2015, 7:42 AM   LOS: 1 day

## 2015-03-11 NOTE — Progress Notes (Signed)
Re-dressed uncovered IVs and added extensions for safety.

## 2015-03-11 NOTE — Progress Notes (Signed)
Patient is due to have a swallow screen. I found no nurse had documented problems with swallowing. I gave patient a little water with straw and patient exhibited no difficulties. I gave morning medications and again no difficulty was noted.

## 2015-03-12 DIAGNOSIS — I9589 Other hypotension: Secondary | ICD-10-CM

## 2015-03-12 DIAGNOSIS — E872 Acidosis: Secondary | ICD-10-CM

## 2015-03-12 LAB — BASIC METABOLIC PANEL
ANION GAP: 8 (ref 5–15)
BUN: 79 mg/dL — ABNORMAL HIGH (ref 6–20)
CO2: 21 mmol/L — AB (ref 22–32)
Calcium: 8.7 mg/dL — ABNORMAL LOW (ref 8.9–10.3)
Chloride: 110 mmol/L (ref 101–111)
Creatinine, Ser: 3.97 mg/dL — ABNORMAL HIGH (ref 0.44–1.00)
GFR calc Af Amer: 12 mL/min — ABNORMAL LOW (ref >60)
GFR calc non Af Amer: 10 mL/min — ABNORMAL LOW (ref >60)
Glucose, Bld: 132 mg/dL — ABNORMAL HIGH (ref 70–99)
Potassium: 3.4 mmol/L — ABNORMAL LOW (ref 3.5–5.1)
SODIUM: 139 mmol/L (ref 135–145)

## 2015-03-12 LAB — CBC
HEMATOCRIT: 27.4 % — AB (ref 36.0–46.0)
Hemoglobin: 9.5 g/dL — ABNORMAL LOW (ref 12.0–15.0)
MCH: 29.1 pg (ref 26.0–34.0)
MCHC: 34.7 g/dL (ref 30.0–36.0)
MCV: 83.8 fL (ref 78.0–100.0)
Platelets: 246 10*3/uL (ref 150–400)
RBC: 3.27 MIL/uL — ABNORMAL LOW (ref 3.87–5.11)
RDW: 13.3 % (ref 11.5–15.5)
WBC: 8 10*3/uL (ref 4.0–10.5)

## 2015-03-12 LAB — MAGNESIUM: Magnesium: 1.8 mg/dL (ref 1.7–2.4)

## 2015-03-12 LAB — PHOSPHORUS: Phosphorus: 5.1 mg/dL — ABNORMAL HIGH (ref 2.5–4.6)

## 2015-03-12 MED ORDER — POTASSIUM CHLORIDE CRYS ER 20 MEQ PO TBCR
40.0000 meq | EXTENDED_RELEASE_TABLET | Freq: Once | ORAL | Status: AC
  Administered 2015-03-12: 40 meq via ORAL
  Filled 2015-03-12: qty 2

## 2015-03-12 NOTE — Progress Notes (Signed)
[name] [name]  MRN: [id number]  DOB/AGE: [birth year] 73 y.o.  Primary Care Physician:FUSCO,LAWRENCE J., MD  Admit date: 03/10/2015  Chief Complaint:  Chief Complaint  Patient presents with  . Hypotension  . Fatigue    S-Pt presented on  03/10/2015 with  Chief Complaint  Patient presents with  . Hypotension  . Fatigue  .    Pt says " I am  Feeling better"  Meds . clopidogrel  75 mg Oral Daily  . enoxaparin (LOVENOX) injection  30 mg Subcutaneous Q24H  . escitalopram  20 mg Oral Daily  . ezetimibe  10 mg Oral Daily  . feeding supplement (RESOURCE BREEZE)  1 Container Oral BID BM  . pantoprazole  40 mg Oral Daily  . sodium chloride  3 mL Intravenous Q12H        Physical Exam: Vital signs in last 24 hours: Temp:  [97.4 F (36.3 C)-98.7 F (37.1 C)] 98 F (36.7 C) (05/04 0800) Pulse Rate:  [62-93] 69 (05/04 0900) Resp:  [11-21] 15 (05/04 0900) BP: (87-129)/(32-75) 97/36 mmHg (05/04 0900) SpO2:  [87 %-100 %] 98 % (05/04 0900) Weight:  [173 lb (78.472 kg)-176 lb 12.9 oz (80.2 kg)] 176 lb 12.9 oz (80.2 kg) (05/04 0500) Weight change: 0 lb (0 kg)    Intake/Output from previous day: 05/03 0701 - 05/04 0700 In: 1028.6 [P.O.:560; I.V.:468.6] Out: 2100 [Urine:2100] Total I/O In: 240 [P.O.:240] Out: 850 [Urine:600; Stool:250]   Physical Exam: General- pt is awake,alert, oriented to time place and person Resp- No acute REsp distress, CTA B/L NO Rhonchi CVS- S1S2 regular in rate and rhythm GIT- BS+, soft, NT, ND EXT- NO LE Edema, No Cyanosis   Lab Results: CBC  Recent Labs  03/11/15 0527 03/12/15 0519  WBC 9.4 8.0  HGB 10.3* 9.5*  HCT 30.5* 27.4*  PLT 245 246    BMET  Recent Labs  03/11/15 0527 03/12/15 0519  NA 135 139  K 4.1 3.4*  CL 112* 110  CO2 14* 21*  GLUCOSE 151* 132*  BUN 91* 79*  CREATININE 5.43* 3.97*  CALCIUM 8.8* 8.7*   Trend Creat 2016 6.5=>5.4=>3.97     13.4( previous admission) 2015  1.5--2.0 2011   1.2--1.5   Trend  Bicarb 10=>16=>26  MICRO Recent Results (from the past 240 hour(s))  MRSA PCR Screening     Status: None   Collection Time: 03/10/15  7:53 PM  Result Value Ref Range Status   MRSA by PCR NEGATIVE NEGATIVE Final    Comment:        The GeneXpert MRSA Assay (FDA approved for NASAL specimens only), is one component of a comprehensive MRSA colonization surveillance program. It is not intended to diagnose MRSA infection nor to guide or monitor treatment for MRSA infections.       Lab Results  Component Value Date   CALCIUM 8.7* 03/12/2015   PHOS 5.1* 03/12/2015               Impression: 1)Renal  AKI secondary to Prerenal/ATN               AKI sec to poor po intake + Hypotension                AKi improving               AKI on CKD               CKD stage 3/4.  CKD since 2011               CKD secondary to HTN                Progression of CKD marked with multiple AKI                  2) CVS-admitted with Hypotension           On pressors           3)Anemia HGb at goal (9--11) Most likely sec to cancer/chronic ds  4)CKD Mineral-Bone Disorder Phosphorus near to goal. Calcium at goal.  5)ONcology- Hx of oral Ca Primary MD following  6)Electrolytes Hypokalemic  will replete  NOrmonatremic   7)Acid base Co2 now  at goal     Plan:  Will continue current care. Will change IVf in am as - bicarb will be much better hopefully. Will follow bmet   Eastman S 03/12/2015, 10:17 AM

## 2015-03-12 NOTE — Progress Notes (Addendum)
[facility] PROGRESS NOTE  BOBBIE VIRDEN XYI:[id number] DOB: [birth year] DOA: 03/10/2015 PCP: Glo Herring., MD  Assessment/Plan: Acute on chronic kidney failure stage 3-4 -Secondary to volume depletion in the setting of ACE inhibitor use. -Continue low-dose dopamine drip and try to wean today. -remains on bicarbonate drip for acidosis that is resolved. -Appreciate nephrology recommendations.  Hypovolemic shock -secondary to decreased oral intake. -Improved with aggressive fluid resuscitation and a low-dose dopamine drip.  Metabolic acidosis -Secondary to acute on chronic kidney disease. -Continue bicarbonate drip today.  Dysphagia -Seen by speech therapy with recommendations for dysphagia 2 diet with thin liquids.   Squamous cell carcinoma of the oral cavity -Continue follow-up at [facility].  Anemia of chronic disease -Hemoglobin at baseline of 9-10.  Hypokalemia -Replete orally, magnesium normal at 1.8.  Code Status: full code Family Communication: patient only  Disposition Plan: keep in ICU as she maintains on dopamine   Consultants:  Nephrology, Dr. Theador Hawthorne   Antibiotics:  none   Subjective: No complaints,specifically no shortness of breath, chest pain.  Objective: Filed Vitals:   03/12/15 0600 03/12/15 0700 03/12/15 0800 03/12/15 0900  BP: 117/47 113/52 90/75 97/36   Pulse: 70 72 71 69  Temp:   98 F (36.7 C)   TempSrc:   Oral   Resp: 12 12 17 15   Height:      Weight:      SpO2: 98% 98% 87% 98%    Intake/Output Summary (Last 24 hours) at 03/12/15 1159 Last data filed at 03/12/15 0900  Gross per 24 hour  Intake 1268.55 ml  Output   2950 ml  Net -1681.45 ml   Filed Weights   03/11/15 0535 03/11/15 1221 03/12/15 0500  Weight: 80.8 kg (178 lb 2.1 oz) 78.472 kg (173 lb) 80.2 kg (176 lb 12.9 oz)    Exam:   General:  Alert, awake, oriented 3, hyperpigmented lesion to lower lip  Cardiovascular: regular rate and rhythm,  systolic murmur  Respiratory: clinical auscultation bilaterally  Abdomen: soft, nontender, nondistended, positive bowel sounds  Extremities: trace bilateral pitting edema   Neurologic:  Grossly intact and nonfocal  Data Reviewed: Basic Metabolic Panel:  Recent Labs Lab 03/10/15 1708 03/10/15 2145 03/11/15 0527 03/12/15 0519  NA 130* 134* 135 139  K 4.9 4.5 4.1 3.4*  CL 107 113* 112* 110  CO2 13* 11* 14* 21*  GLUCOSE 112* 93 151* 132*  BUN 100* 93* 91* 79*  CREATININE 6.58* 6.02* 5.43* 3.97*  CALCIUM 8.8* 8.5* 8.8* 8.7*  MG  --   --   --  1.8  PHOS  --   --   --  5.1*   Liver Function Tests:  Recent Labs Lab 03/10/15 1708 03/11/15 0527  AST 9* 8*  ALT 10* 9*  ALKPHOS 75 78  BILITOT 0.3 0.5  PROT 6.3* 6.0*  ALBUMIN 3.5 3.3*   No results for input(s): LIPASE, AMYLASE in the last 168 hours. No results for input(s): AMMONIA in the last 168 hours. CBC:  Recent Labs Lab 03/10/15 1708 03/11/15 0527 03/12/15 0519  WBC 9.6 9.4 8.0  NEUTROABS 6.8  --   --   HGB 9.7* 10.3* 9.5*  HCT 29.1* 30.5* 27.4*  MCV 86.4 85.2 83.8  PLT 235 245 246   Cardiac Enzymes: No results for input(s): CKTOTAL, CKMB, CKMBINDEX, TROPONINI in the last 168 hours. BNP (last 3 results) No results for input(s): BNP in the last 8760 hours.  ProBNP (last 3 results) No results for input(s):  PROBNP in the last 8760 hours.  CBG: No results for input(s): GLUCAP in the last 168 hours.  Recent Results (from the past 240 hour(s))  MRSA PCR Screening     Status: None   Collection Time: 03/10/15  7:53 PM  Result Value Ref Range Status   MRSA by PCR NEGATIVE NEGATIVE Final    Comment:        The GeneXpert MRSA Assay (FDA approved for NASAL specimens only), is one component of a comprehensive MRSA colonization surveillance program. It is not intended to diagnose MRSA infection nor to guide or monitor treatment for MRSA infections.      Studies: Dg Chest Portable 1 View  03/10/2015    CLINICAL DATA:  Hypotension and fatigue. Weakness and fatigue. Symptoms for 1 week.  EXAM: PORTABLE CHEST - 1 VIEW  COMPARISON:  12/23/2014.  FINDINGS: Cardiopericardial silhouette within normal limits. Mediastinal contours normal. Trachea midline. No airspace disease or effusion. Monitoring leads project over the chest. Surgical clips are present at the gastroesophageal junction, possibly for prior fundoplication. Removal of the PICC line seen on the most recent comparison. Probable granulomatous calcifications in the RIGHT hilum and RIGHT lung.  IMPRESSION: No active disease.   Electronically Signed   By: Dereck Ligas M.D.   On: 03/10/2015 17:18    Scheduled Meds: . clopidogrel  75 mg Oral Daily  . enoxaparin (LOVENOX) injection  30 mg Subcutaneous Q24H  . escitalopram  20 mg Oral Daily  . ezetimibe  10 mg Oral Daily  . feeding supplement (RESOURCE BREEZE)  1 Container Oral BID BM  . pantoprazole  40 mg Oral Daily  . sodium chloride  3 mL Intravenous Q12H   Continuous Infusions: . DOPamine 5 mcg/kg/min (03/12/15 0900)  .  sodium bicarbonate  infusion 1000 mL 75 mL/hr at 03/12/15 0900    Active Problems:   Depression   Acute on chronic renal failure   Encephalopathy acute   Oral-mouth cancer s/p laser excision of verrucus SCC of oral cavity and lips 45/9/97   Metabolic acidosis   Hypotension   Dehydration    Critical care time spent: 85 minutes. Greater than 50% of this time was spent in direct contact with the patient coordinating care.    [name]  [facility] Pager [phone]  If 7PM-7AM, please contact night-coverage at [email or web address], password [facility] 03/12/2015, 11:59 AM  LOS: 2 days

## 2015-03-13 DIAGNOSIS — R571 Hypovolemic shock: Secondary | ICD-10-CM

## 2015-03-13 LAB — CBC
HCT: 27.2 % — ABNORMAL LOW (ref 36.0–46.0)
Hemoglobin: 9.4 g/dL — ABNORMAL LOW (ref 12.0–15.0)
MCH: 29.2 pg (ref 26.0–34.0)
MCHC: 34.6 g/dL (ref 30.0–36.0)
MCV: 84.5 fL (ref 78.0–100.0)
Platelets: 251 10*3/uL (ref 150–400)
RBC: 3.22 MIL/uL — AB (ref 3.87–5.11)
RDW: 13.5 % (ref 11.5–15.5)
WBC: 7.9 10*3/uL (ref 4.0–10.5)

## 2015-03-13 LAB — CORTISOL: Cortisol, Plasma: 14.1 ug/dL

## 2015-03-13 MED ORDER — HYDROCORTISONE NA SUCCINATE PF 100 MG IJ SOLR
50.0000 mg | Freq: Three times a day (TID) | INTRAMUSCULAR | Status: DC
  Administered 2015-03-13 – 2015-03-14 (×4): 50 mg via INTRAVENOUS
  Filled 2015-03-13 (×4): qty 2

## 2015-03-13 MED ORDER — SODIUM CHLORIDE 0.9 % IV SOLN
INTRAVENOUS | Status: DC
  Administered 2015-03-13: 09:00:00 via INTRAVENOUS
  Administered 2015-03-13: 1000 mL via INTRAVENOUS
  Administered 2015-03-14 – 2015-03-16 (×5): via INTRAVENOUS

## 2015-03-13 NOTE — Progress Notes (Addendum)
[facility] PROGRESS NOTE  [name] [name] [id number]:[id number] DOB: 1941-02-23 DOA: 03/10/2015 PCP: Glo Herring., MD  Assessment/Plan: Hypovolemic shock -Appears secondary to decreased oral intake related to her oral cavity squamous cell carcinoma. -Improved with aggressive fluid resuscitation and low dose dopamine drip. -As of yet, we have been unable to completely wean her off dopamine given continued hypotension. -We'll check cortisol level, and start her on stress dose steroids pending result.  Acute on chronic kidney disease stage 3-4 -Presumed secondary to volume depletion in the setting of ACE inhibitor use. Also cannot rule out ATN at present. -Metabolic acidosis has resolved and bicarbonate drip has been discontinued. -Appreciate nephrology recommendations.  Metabolic acidosis  -Resolved. -Secondary to acute on chronic kidney disease.  Dysphagia -Seen by speech therapy with recommendations for dysphagia 2 diet with thin liquids.  Squamous cell carcinoma of the oral cavity -Patient will follow-up at [facility] as an outpatient.  Hypokalemia -Repleted yesterday, be metastases from today pending.  Anemia of chronic disease -Secondary to chronic kidney disease. -Hemoglobin remains at baseline of 9-10.  Code Status: Full code Family Communication: Patient only  Disposition Plan: Keep in ICU today until able to wean off pressors   Consultants:  Nephrology, Dr. Hinda Lenis   Antibiotics:  None   Subjective: Appetite is increasing, denies any pain or discomfort.  Objective: Filed Vitals:   03/13/15 0500 03/13/15 0600 03/13/15 0700 03/13/15 0800  BP: 114/57 109/53 110/51 136/101  Pulse: 89 86 82 92  Temp:    98.4 F (36.9 C)  TempSrc:    Oral  Resp: 16 11 11 15   Height:      Weight: 79.1 kg (174 lb 6.1 oz)     SpO2: 98% 97% 97% 100%    Intake/Output Summary (Last 24 hours) at 03/13/15 0942 Last data filed at 03/13/15 0220  Gross per 24 hour    Intake 2743.26 ml  Output   1750 ml  Net 993.26 ml   Filed Weights   03/11/15 1221 03/12/15 0500 03/13/15 0500  Weight: 78.472 kg (173 lb) 80.2 kg (176 lb 12.9 oz) 79.1 kg (174 lb 6.1 oz)    Exam:   General:  Alert, awake, oriented 3, no distress  Cardiovascular: Regular rate and rhythm, positive systolic murmur, no rubs or gallops  Respiratory: Clear to auscultation bilaterally  Abdomen: Soft, nontender, nondistended, positive bowel sounds, obese  Extremities: Trace bilateral pitting edema   Neurologic:  Nonfocal  Data Reviewed: Basic Metabolic Panel:  Recent Labs Lab 03/10/15 1708 03/10/15 2145 03/11/15 0527 03/12/15 0519  NA 130* 134* 135 139  K 4.9 4.5 4.1 3.4*  CL 107 113* 112* 110  CO2 13* 11* 14* 21*  GLUCOSE 112* 93 151* 132*  BUN 100* 93* 91* 79*  CREATININE 6.58* 6.02* 5.43* 3.97*  CALCIUM 8.8* 8.5* 8.8* 8.7*  MG  --   --   --  1.8  PHOS  --   --   --  5.1*   Liver Function Tests:  Recent Labs Lab 03/10/15 1708 03/11/15 0527  AST 9* 8*  ALT 10* 9*  ALKPHOS 75 78  BILITOT 0.3 0.5  PROT 6.3* 6.0*  ALBUMIN 3.5 3.3*   No results for input(s): LIPASE, AMYLASE in the last 168 hours. No results for input(s): AMMONIA in the last 168 hours. CBC:  Recent Labs Lab 03/10/15 1708 03/11/15 0527 03/12/15 0519 03/13/15 0456  WBC 9.6 9.4 8.0 7.9  NEUTROABS 6.8  --   --   --  HGB 9.7* 10.3* 9.5* 9.4*  HCT 29.1* 30.5* 27.4* 27.2*  MCV 86.4 85.2 83.8 84.5  PLT 235 245 246 251   Cardiac Enzymes: No results for input(s): CKTOTAL, CKMB, CKMBINDEX, TROPONINI in the last 168 hours. BNP (last 3 results) No results for input(s): BNP in the last 8760 hours.  ProBNP (last 3 results) No results for input(s): PROBNP in the last 8760 hours.  CBG: No results for input(s): GLUCAP in the last 168 hours.  Recent Results (from the past 240 hour(s))  MRSA PCR Screening     Status: None   Collection Time: 03/10/15  7:53 PM  Result Value Ref Range Status    MRSA by PCR NEGATIVE NEGATIVE Final    Comment:        The GeneXpert MRSA Assay (FDA approved for NASAL specimens only), is one component of a comprehensive MRSA colonization surveillance program. It is not intended to diagnose MRSA infection nor to guide or monitor treatment for MRSA infections.      Studies: No results found.  Scheduled Meds: . clopidogrel  75 mg Oral Daily  . enoxaparin (LOVENOX) injection  30 mg Subcutaneous Q24H  . escitalopram  20 mg Oral Daily  . ezetimibe  10 mg Oral Daily  . feeding supplement (RESOURCE BREEZE)  1 Container Oral BID BM  . pantoprazole  40 mg Oral Daily  . sodium chloride  3 mL Intravenous Q12H   Continuous Infusions: . sodium chloride 125 mL/hr at 03/13/15 0915  . DOPamine 5 mcg/kg/min (03/13/15 0701)    Active Problems:   Depression   Acute on chronic renal failure   Encephalopathy acute   Oral-mouth cancer s/p laser excision of verrucus SCC of oral cavity and lips 17/7/93   Metabolic acidosis   Hypovolemic shock   Dehydration    Critical care time spent: 45 minutes. Greater than 50% of this time was spent in direct contact with the patient coordinating care.    [name]  [facility] Pager [phone]  If 7PM-7AM, please contact night-coverage at [email or web address], password [facility] 03/13/2015, 9:42 AM  LOS: 3 days

## 2015-03-13 NOTE — Progress Notes (Signed)
Subjective: Interval History: has no complaint of difficulty in breathing. Patient does state that her appetite is okay but was not good..  Objective: Vital signs in last 24 hours: Temp:  [97.5 F (36.4 C)-98.5 F (36.9 C)] 98.4 F (36.9 C) (05/05 0800) Pulse Rate:  [68-98] 92 (05/05 0800) Resp:  [11-21] 15 (05/05 0800) BP: (71-136)/(35-101) 136/101 mmHg (05/05 0800) SpO2:  [94 %-100 %] 100 % (05/05 0800) Weight:  [79.1 kg (174 lb 6.1 oz)] 79.1 kg (174 lb 6.1 oz) (05/05 0500) Weight change: 0.628 kg (1 lb 6.1 oz)  Intake/Output from previous day: 05/04 0701 - 05/05 0700 In: 2983.3 [P.O.:720; I.V.:2263.3] Out: 2600 [Urine:2350; Stool:250] Intake/Output this shift:    General appearance: alert, cooperative and no distress Resp: clear to auscultation bilaterally Cardio: regular rate and rhythm, S1, S2 normal, no murmur, click, rub or gallop GI: soft, non-tender; bowel sounds normal; no masses,  no organomegaly Extremities: extremities normal, atraumatic, no cyanosis or edema  Lab Results:  Recent Labs  03/12/15 0519 03/13/15 0456  WBC 8.0 7.9  HGB 9.5* 9.4*  HCT 27.4* 27.2*  PLT 246 251   BMET:  Recent Labs  03/11/15 0527 03/12/15 0519  NA 135 139  K 4.1 3.4*  CL 112* 110  CO2 14* 21*  GLUCOSE 151* 132*  BUN 91* 79*  CREATININE 5.43* 3.97*  CALCIUM 8.8* 8.7*   No results for input(s): PTH in the last 72 hours. Iron Studies: No results for input(s): IRON, TIBC, TRANSFERRIN, FERRITIN in the last 72 hours.  Studies/Results: No results found.  I have reviewed the patient's current medications.  Assessment/Plan: Problem #1 acute kidney injury: Possibly prerenal versus ATN. Her renal function has improved. Problem #2 metabolic acidosis: Patient on sodium bicarbonate and her CO2 has improved Problem #3 anemia: Her hemoglobin is below our target goal but stable. Problem #4 history of hypotension: Her blood pressure seems to be improving. Most likely hypovolemic  shock. Problem #5 history of diabetes Problem #7 history of oral cancer Problem #7 history of CVA Plan: DC IV fluid with sodium bicarbonate We'll start her on normal saline at 120 mL per hour We'll check her basic metabolic panel in the morning.    LOS: 3 days   Cacey Willow S 03/13/2015,9:05 AM

## 2015-03-14 LAB — BASIC METABOLIC PANEL
Anion gap: 9 (ref 5–15)
BUN: 48 mg/dL — AB (ref 6–20)
CALCIUM: 8.6 mg/dL — AB (ref 8.9–10.3)
CHLORIDE: 108 mmol/L (ref 101–111)
CO2: 23 mmol/L (ref 22–32)
CREATININE: 2.27 mg/dL — AB (ref 0.44–1.00)
GFR calc Af Amer: 23 mL/min — ABNORMAL LOW (ref >60)
GFR calc non Af Amer: 20 mL/min — ABNORMAL LOW (ref >60)
GLUCOSE: 139 mg/dL — AB (ref 70–99)
Potassium: 3.3 mmol/L — ABNORMAL LOW (ref 3.5–5.1)
Sodium: 140 mmol/L (ref 135–145)

## 2015-03-14 LAB — GLUCOSE, CAPILLARY
GLUCOSE-CAPILLARY: 149 mg/dL — AB (ref 70–99)
Glucose-Capillary: 172 mg/dL — ABNORMAL HIGH (ref 70–99)

## 2015-03-14 LAB — CBC
HCT: 26.3 % — ABNORMAL LOW (ref 36.0–46.0)
Hemoglobin: 9 g/dL — ABNORMAL LOW (ref 12.0–15.0)
MCH: 29.2 pg (ref 26.0–34.0)
MCHC: 34.2 g/dL (ref 30.0–36.0)
MCV: 85.4 fL (ref 78.0–100.0)
Platelets: 228 10*3/uL (ref 150–400)
RBC: 3.08 MIL/uL — ABNORMAL LOW (ref 3.87–5.11)
RDW: 13.6 % (ref 11.5–15.5)
WBC: 7.6 10*3/uL (ref 4.0–10.5)

## 2015-03-14 LAB — MAGNESIUM: MAGNESIUM: 1.3 mg/dL — AB (ref 1.7–2.4)

## 2015-03-14 MED ORDER — HYDROCORTISONE NA SUCCINATE PF 100 MG IJ SOLR
50.0000 mg | Freq: Two times a day (BID) | INTRAMUSCULAR | Status: DC
  Administered 2015-03-14 – 2015-03-15 (×2): 50 mg via INTRAVENOUS
  Filled 2015-03-14 (×2): qty 2

## 2015-03-14 MED ORDER — POTASSIUM CHLORIDE CRYS ER 20 MEQ PO TBCR
40.0000 meq | EXTENDED_RELEASE_TABLET | Freq: Two times a day (BID) | ORAL | Status: DC
  Administered 2015-03-14 – 2015-03-15 (×4): 40 meq via ORAL
  Filled 2015-03-14 (×5): qty 2

## 2015-03-14 NOTE — Progress Notes (Signed)
Pts family brought in pts colostomy supplies so it could be changed. I removed old colostomy and cleaned the area. Used skin prep and protective powder around stoma sight and placed a new clean colostomy bag over stoma. Stoma was red and had some slight bleeding. Pt said that is normal for her.

## 2015-03-14 NOTE — Progress Notes (Signed)
[facility] PROGRESS NOTE  WILLO [name] HDQ:[id number] DOB: [birth year] DOA: 03/10/2015 PCP: Glo Herring., MD  Assessment/Plan: Hypovolemic shock -Secondary to decreased oral intake which is intermittent related to her oral cavity squamous cell carcinoma. -Has been off the dopamine drip as of 5 hours ago and is maintaining blood pressures in the 11-941 systolic range. -Continue fluid resuscitation.  -Cortisol level has returned at 14.1, we'll start titrating stress dose steroids.  Acute on chronic kidney disease stage 3-4 -Improved and back to baseline. -Presumed secondary to volume depletion in the setting of ACE inhibitor use, also possibly ATN giving hypovolemic shock.  Metabolic acidosis -Secondary to acute component of renal failure, resolved. -Off bicarbonate drip.  Hypokalemia -Continue to replete orally, check magnesium level.   Dysphagia -Seen by speech therapy with recommendations for dysphagia 2 diet with thin liquids.  Anemia of chronic disease -Secondary to chronic kidney disease. -Hemoglobin remains at baseline of 9-10.  Code Status: Full code Family Communication: Patient only  Disposition Plan: Monitor in the ICU an extra day. Will obtain PT evaluation today in anticipation of discharge home over the weekend.   Consultants:  Nephrology, Dr. Hinda Lenis   Antibiotics:  None   Subjective: Feels much improved today, is eating breakfast at time of my exam, wants to be home by Sunday.  Objective: Filed Vitals:   03/14/15 0830 03/14/15 0845 03/14/15 0900 03/14/15 0915  BP: 101/62 97/41 91/40  96/46  Pulse: 77 66 73 72  Temp:      TempSrc:      Resp: 20 18 20 19   Height:      Weight:      SpO2: 100% 100% 100% 100%    Intake/Output Summary (Last 24 hours) at 03/14/15 0933 Last data filed at 03/14/15 0900  Gross per 24 hour  Intake 2935.42 ml  Output   1400 ml  Net 1535.42 ml   Filed Weights   03/12/15 0500 03/13/15 0500  03/14/15 0500  Weight: 80.2 kg (176 lb 12.9 oz) 79.1 kg (174 lb 6.1 oz) 84.6 kg (186 lb 8.2 oz)    Exam:   General:  Alert, awake, oriented 3, no distress  Cardiovascular: Regular rate and rhythm, systolic murmur, no rubs or gallops  Respiratory: Clear to auscultation bilaterally  Abdomen: Obese, soft, nontender, nondistended, positive bowel sounds  Extremities: 1-2+ pitting edema bilaterally   Neurologic:  Intact and nonfocal.  Data Reviewed: Basic Metabolic Panel:  Recent Labs Lab 03/10/15 1708 03/10/15 2145 03/11/15 0527 03/12/15 0519 03/14/15 0502  NA 130* 134* 135 139 140  K 4.9 4.5 4.1 3.4* 3.3*  CL 107 113* 112* 110 108  CO2 13* 11* 14* 21* 23  GLUCOSE 112* 93 151* 132* 139*  BUN 100* 93* 91* 79* 48*  CREATININE 6.58* 6.02* 5.43* 3.97* 2.27*  CALCIUM 8.8* 8.5* 8.8* 8.7* 8.6*  MG  --   --   --  1.8  --   PHOS  --   --   --  5.1*  --    Liver Function Tests:  Recent Labs Lab 03/10/15 1708 03/11/15 0527  AST 9* 8*  ALT 10* 9*  ALKPHOS 75 78  BILITOT 0.3 0.5  PROT 6.3* 6.0*  ALBUMIN 3.5 3.3*   No results for input(s): LIPASE, AMYLASE in the last 168 hours. No results for input(s): AMMONIA in the last 168 hours. CBC:  Recent Labs Lab 03/10/15 1708 03/11/15 0527 03/12/15 0519 03/13/15 0456 03/14/15 0502  WBC 9.6 9.4 8.0 7.9 7.6  NEUTROABS 6.8  --   --   --   --   HGB 9.7* 10.3* 9.5* 9.4* 9.0*  HCT 29.1* 30.5* 27.4* 27.2* 26.3*  MCV 86.4 85.2 83.8 84.5 85.4  PLT 235 245 246 251 228   Cardiac Enzymes: No results for input(s): CKTOTAL, CKMB, CKMBINDEX, TROPONINI in the last 168 hours. BNP (last 3 results) No results for input(s): BNP in the last 8760 hours.  ProBNP (last 3 results) No results for input(s): PROBNP in the last 8760 hours.  CBG: No results for input(s): GLUCAP in the last 168 hours.  Recent Results (from the past 240 hour(s))  MRSA PCR Screening     Status: None   Collection Time: 03/10/15  7:53 PM  Result Value Ref  Range Status   MRSA by PCR NEGATIVE NEGATIVE Final    Comment:        The GeneXpert MRSA Assay (FDA approved for NASAL specimens only), is one component of a comprehensive MRSA colonization surveillance program. It is not intended to diagnose MRSA infection nor to guide or monitor treatment for MRSA infections.      Studies: No results found.  Scheduled Meds: . clopidogrel  75 mg Oral Daily  . enoxaparin (LOVENOX) injection  30 mg Subcutaneous Q24H  . escitalopram  20 mg Oral Daily  . ezetimibe  10 mg Oral Daily  . feeding supplement (RESOURCE BREEZE)  1 Container Oral BID BM  . hydrocortisone sod succinate (SOLU-CORTEF) inj  50 mg Intravenous Q8H  . pantoprazole  40 mg Oral Daily  . potassium chloride  40 mEq Oral BID  . sodium chloride  3 mL Intravenous Q12H   Continuous Infusions: . sodium chloride 75 mL/hr at 03/14/15 0900  . DOPamine Stopped (03/14/15 7262)    Active Problems:   Depression   Acute on chronic renal failure   Encephalopathy acute   Oral-mouth cancer s/p laser excision of verrucus SCC of oral cavity and lips 01/10/58   Metabolic acidosis   Hypovolemic shock   Dehydration    Time spent: 35 minutes. Greater than 50% of this time was spent in direct contact with the patient coordinating care.    [name]  [facility] Pager [phone]  If 7PM-7AM, please contact night-coverage at [email or web address], password [facility] 03/14/2015, 9:33 AM  LOS: 4 days

## 2015-03-14 NOTE — Care Management Note (Signed)
Case Management Note  Patient Details  Name: [name] [name] MRN: [id number] Date of Birth: 03-Oct-1941   Expected Discharge Date:  03/14/15               Expected Discharge Plan:  [place]  In-House Referral:  NA  Discharge planning Services  CM Consult  Post Acute Care Choice:  Home Health Choice offered to:  Patient  DME Arranged:    DME Agency:     HH Arranged:  RN, PT, Nurse's Aide [place] Agency:  [place]  Status of Service:  Completed, signed off  Medicare Important Message Given:  Yes Date Medicare IM Given:  03/14/15 Medicare IM give by:  Jolene Provost, RN, MSN, CM Date Additional Medicare IM Given:    Additional Medicare Important Message give by:     If discussed at [place] of Stay Meetings, dates discussed:    Additional Comments: [name] home over weekend. RN to notify [name] tomorrow. No further CM needs.  Sherald Barge, RN 03/14/2015, 12:34 PM

## 2015-03-14 NOTE — Progress Notes (Signed)
Subjective: Interval History: Patient offers no complaints. Her appetite is good and she doesn't have any nausea, vomiting or diarrhea.  Objective: Vital signs in last 24 hours: Temp:  [98.2 F (36.8 C)-98.8 F (37.1 C)] 98.4 F (36.9 C) (05/06 0400) Pulse Rate:  [55-254] 56 (05/06 0645) Resp:  [12-22] 15 (05/06 0645) BP: (86-138)/(41-101) 105/43 mmHg (05/06 0645) SpO2:  [88 %-100 %] 99 % (05/06 0645) Weight:  [84.6 kg (186 lb 8.2 oz)] 84.6 kg (186 lb 8.2 oz) (05/06 0500) Weight change: 5.5 kg (12 lb 2 oz)  Intake/Output from previous day: 05/05 0701 - 05/06 0700 In: -  Out: 1400 [Urine:1400] Intake/Output this shift:    General appearance: alert, cooperative and no distress Resp: clear to auscultation bilaterally Cardio: regular rate and rhythm, S1, S2 normal, no murmur, click, rub or gallop GI: soft, non-tender; bowel sounds normal; no masses,  no organomegaly Extremities: extremities normal, atraumatic, no cyanosis or edema  Lab Results:  Recent Labs  03/13/15 0456 03/14/15 0502  WBC 7.9 7.6  HGB 9.4* 9.0*  HCT 27.2* 26.3*  PLT 251 228   BMET:   Recent Labs  03/12/15 0519 03/14/15 0502  NA 139 140  K 3.4* 3.3*  CL 110 108  CO2 21* 23  GLUCOSE 132* 139*  BUN 79* 48*  CREATININE 3.97* 2.27*  CALCIUM 8.7* 8.6*   No results for input(s): PTH in the last 72 hours. Iron Studies: No results for input(s): IRON, TIBC, TRANSFERRIN, FERRITIN in the last 72 hours.  Studies/Results: No results found.  I have reviewed the patient's current medications.  Assessment/Plan: Problem #1 acute kidney injury: Possibly prerenal versus ATN. Her renal function continued to improve. Presently has returned to her baseline. Problem #2 metabolic acidosis:.Her CO2 is 23 which is within acceptable range and has improved. Presently patient is not on sodium bicarbonate supplement. This could be associated to the recovery of her renal failure. Problem #3 anemia: Her hemoglobin is  below our target goal but stable. Problem #4 history of hypotension: Her blood pressure seems to be improving. Most likely hypovolemic shock. Problem #5 history of diabetes Problem #7 history of oral cancer Problem #7 history of CVA Problem #8 hypokalemia: Her potassium seems to be declining. Plan: Decrease IV fluid to 75 mL per hour Encourage by mouth fluid intake. We'll start patient on KCl 40 mEq by mouth twice a day. We'll check her basic metabolic panel in the morning.    LOS: 4 days   [name] [name] S 03/14/2015,7:44 AM

## 2015-03-15 LAB — GLUCOSE, CAPILLARY: GLUCOSE-CAPILLARY: 90 mg/dL (ref 70–99)

## 2015-03-15 LAB — CBC
HCT: 26.4 % — ABNORMAL LOW (ref 36.0–46.0)
Hemoglobin: 9 g/dL — ABNORMAL LOW (ref 12.0–15.0)
MCH: 29.4 pg (ref 26.0–34.0)
MCHC: 34.1 g/dL (ref 30.0–36.0)
MCV: 86.3 fL (ref 78.0–100.0)
PLATELETS: 226 10*3/uL (ref 150–400)
RBC: 3.06 MIL/uL — AB (ref 3.87–5.11)
RDW: 13.8 % (ref 11.5–15.5)
WBC: 9.2 10*3/uL (ref 4.0–10.5)

## 2015-03-15 MED ORDER — CHLORHEXIDINE GLUCONATE 0.12 % MT SOLN
15.0000 mL | Freq: Two times a day (BID) | OROMUCOSAL | Status: DC
Start: 2015-03-15 — End: 2015-03-16
  Administered 2015-03-15: 15 mL via OROMUCOSAL
  Filled 2015-03-15: qty 15

## 2015-03-15 MED ORDER — CETYLPYRIDINIUM CHLORIDE 0.05 % MT LIQD: 7.0000 mL | Freq: Two times a day (BID) | OROMUCOSAL | Status: DC

## 2015-03-15 NOTE — Progress Notes (Signed)
[facility] PROGRESS NOTE  [name] [name] GYJ:[id number] DOB: [birth year] DOA: 03/10/2015 PCP: Glo Herring., MD  Assessment/Plan: Hypovolemic shock -Secondary to decreased oral intake which in turn is related to her oral cavity squamous cell carcinoma. -Has been off pressors for greater than 24 hours and is maintaining her blood pressure. -Continue to titrate stress dose steroids today.  Acute on chronic kidney disease stage 3-4 -Labs from today are pending but as of yesterday, her creatinine had improved and was back to baseline. -Presumed secondary to volume depletion in the setting of ACE inhibitor use, also possibly ATN given hypovolemic shock.  Metabolic acidosis -Secondary to renal failure, resolved, off bicarbonate drip.  Hypokalemia -Replete orally  Hypomagnesemia -Repleted orally  Dysphagia -Continue D2 diet with thin liquids  Anemia of chronic disease -Secondary to chronic kidney disease. -Hemoglobin remains at baseline of 9-10.  Code Status: Full code Family Communication: Patient only  Disposition Plan: Transfer to floor, possibly home in 24 hours   Consultants:  Renal   Antibiotics:  None   Subjective: Feels good, no chest pain, shortness of breath, no abdominal pain  Objective: Filed Vitals:   03/15/15 0400 03/15/15 0500 03/15/15 0600 03/15/15 0754  BP: 120/59 109/56 112/48   Pulse: 59 71 56   Temp:  100.2 F (37.9 C)  98.2 F (36.8 C)  TempSrc:  Oral  Oral  Resp: 15 17 16    Height:      Weight:      SpO2: 100% 97% 98%     Intake/Output Summary (Last 24 hours) at 03/15/15 0842 Last data filed at 03/15/15 0800  Gross per 24 hour  Intake 5500.42 ml  Output    752 ml  Net 4748.42 ml   Filed Weights   03/12/15 0500 03/13/15 0500 03/14/15 0500  Weight: 80.2 kg (176 lb 12.9 oz) 79.1 kg (174 lb 6.1 oz) 84.6 kg (186 lb 8.2 oz)    Exam:   General:  Alert, awake, oriented 3  Cardiovascular: Regular rate and rhythm,  systolic murmur  Respiratory: Clear to auscultation bilaterally  Abdomen: Obese, soft, nontender, nondistended  Extremities: 1-2+ edema bilaterally   Neurologic:  Grossly intact and nonfocal  Data Reviewed: Basic Metabolic Panel:  Recent Labs Lab 03/10/15 1708 03/10/15 2145 03/11/15 0527 03/12/15 0519 03/14/15 0502  NA 130* 134* 135 139 140  K 4.9 4.5 4.1 3.4* 3.3*  CL 107 113* 112* 110 108  CO2 13* 11* 14* 21* 23  GLUCOSE 112* 93 151* 132* 139*  BUN 100* 93* 91* 79* 48*  CREATININE 6.58* 6.02* 5.43* 3.97* 2.27*  CALCIUM 8.8* 8.5* 8.8* 8.7* 8.6*  MG  --   --   --  1.8 1.3*  PHOS  --   --   --  5.1*  --    Liver Function Tests:  Recent Labs Lab 03/10/15 1708 03/11/15 0527  AST 9* 8*  ALT 10* 9*  ALKPHOS 75 78  BILITOT 0.3 0.5  PROT 6.3* 6.0*  ALBUMIN 3.5 3.3*   No results for input(s): LIPASE, AMYLASE in the last 168 hours. No results for input(s): AMMONIA in the last 168 hours. CBC:  Recent Labs Lab 03/10/15 1708 03/11/15 0527 03/12/15 0519 03/13/15 0456 03/14/15 0502 03/15/15 0505  WBC 9.6 9.4 8.0 7.9 7.6 9.2  NEUTROABS 6.8  --   --   --   --   --   HGB 9.7* 10.3* 9.5* 9.4* 9.0* 9.0*  HCT 29.1* 30.5* 27.4* 27.2* 26.3* 26.4*  MCV 86.4 85.2 83.8 84.5 85.4 86.3  PLT 235 245 246 251 228 226   Cardiac Enzymes: No results for input(s): CKTOTAL, CKMB, CKMBINDEX, TROPONINI in the last 168 hours. BNP (last 3 results) No results for input(s): BNP in the last 8760 hours.  ProBNP (last 3 results) No results for input(s): PROBNP in the last 8760 hours.  CBG:  Recent Labs Lab 03/14/15 1110 03/14/15 2019 03/15/15 0738  GLUCAP 172* 149* 90    Recent Results (from the past 240 hour(s))  MRSA PCR Screening     Status: None   Collection Time: 03/10/15  7:53 PM  Result Value Ref Range Status   MRSA by PCR NEGATIVE NEGATIVE Final    Comment:        The GeneXpert MRSA Assay (FDA approved for NASAL specimens only), is one component of  a comprehensive MRSA colonization surveillance program. It is not intended to diagnose MRSA infection nor to guide or monitor treatment for MRSA infections.      Studies: No results found.  Scheduled Meds: . antiseptic oral rinse  7 mL Mouth Rinse q12n4p  . chlorhexidine  15 mL Mouth Rinse BID  . clopidogrel  75 mg Oral Daily  . enoxaparin (LOVENOX) injection  30 mg Subcutaneous Q24H  . escitalopram  20 mg Oral Daily  . ezetimibe  10 mg Oral Daily  . feeding supplement (RESOURCE BREEZE)  1 Container Oral BID BM  . hydrocortisone sod succinate (SOLU-CORTEF) inj  50 mg Intravenous Q12H  . pantoprazole  40 mg Oral Daily  . potassium chloride  40 mEq Oral BID  . sodium chloride  3 mL Intravenous Q12H   Continuous Infusions: . sodium chloride 75 mL/hr at 03/15/15 0800    Active Problems:   Depression   Acute on chronic renal failure   Encephalopathy acute   Oral-mouth cancer s/p laser excision of verrucus SCC of oral cavity and lips 75/1/02   Metabolic acidosis   Hypovolemic shock   Dehydration    Time spent: 30 minutes. Greater than 50% of this time was spent in direct contact with the patient coordinating care.    [name]  [facility] Pager [phone]  If 7PM-7AM, please contact night-coverage at [email or web address], password [facility] 03/15/2015, 8:42 AM  LOS: 5 days

## 2015-03-15 NOTE — Progress Notes (Signed)
Pt transferring to unit 300 today. Pt/family is aware and agreeable to transfer. Assessment is unchanged from this morning and receiving RN has been given report. Belongings sent with pt at bedside.  

## 2015-03-15 NOTE — Progress Notes (Signed)
Subjective: Interval History: Patient feels good and offers no complaints except difficulty in swallowing potassium tablet.  Objective: Vital signs in last 24 hours: Temp:  [98.2 F (36.8 C)-100.2 F (37.9 C)] 98.2 F (36.8 C) (05/07 0754) Pulse Rate:  [55-134] 56 (05/07 0600) Resp:  [13-22] 16 (05/07 0600) BP: (91-129)/(35-83) 112/48 mmHg (05/07 0600) SpO2:  [82 %-100 %] 98 % (05/07 0600) Weight change:   Intake/Output from previous day: 05/06 0701 - 05/07 0700 In: 5350.4 [P.O.:840; I.V.:4510.4] Out: 752 [Urine:750; Stool:2] Intake/Output this shift: Total I/O In: 270 [P.O.:120; I.V.:150] Out: 150 [Urine:150]  Generally: Patient is alert and in no apparent distress. Chest is clear to auscultation Heart exam revealed regular rate and rhythm murmur Extremities no edema  Lab Results:  Recent Labs  03/14/15 0502 03/15/15 0505  WBC 7.6 9.2  HGB 9.0* 9.0*  HCT 26.3* 26.4*  PLT 228 226   BMET:   Recent Labs  03/14/15 0502  NA 140  K 3.3*  CL 108  CO2 23  GLUCOSE 139*  BUN 48*  CREATININE 2.27*  CALCIUM 8.6*   No results for input(s): PTH in the last 72 hours. Iron Studies: No results for input(s): IRON, TIBC, TRANSFERRIN, FERRITIN in the last 72 hours.  Studies/Results: No results found.  I have reviewed the patient's current medications.  Assessment/Plan: Problem #1 acute kidney injury: Possibly prerenal versus ATN. Her renal function continued to improve. Presently has returned to her baseline. Presently her basic metabolic panel is pending. Problem #2 metabolic acidosis:.Her CO2 is 23 which is within acceptable range and has improved. Presently patient is not on sodium bicarbonate supplement. This could be associated to the recovery of her renal failure. Problem #3 anemia: Her hemoglobin is below our target goal but stable. Problem #4 history of hypotension: Her blood pressure is better Problem #5 history of diabetes: Reasonably controlled Problem #7  history of oral cancer Problem #7 history of CVA Problem #8 hypokalemia: Patient was given oral potassium yesterday. Plan: Decrease IV to 50 mL per hour We'll check her basic metabolic panel in the morning.    LOS: 5 days   Tremont Gavitt S 03/15/2015,9:34 AM

## 2015-03-16 LAB — BASIC METABOLIC PANEL
Anion gap: 4 — ABNORMAL LOW (ref 5–15)
BUN: 40 mg/dL — AB (ref 6–20)
CO2: 23 mmol/L (ref 22–32)
CREATININE: 1.9 mg/dL — AB (ref 0.44–1.00)
Calcium: 8.3 mg/dL — ABNORMAL LOW (ref 8.9–10.3)
Chloride: 114 mmol/L — ABNORMAL HIGH (ref 101–111)
GFR calc non Af Amer: 25 mL/min — ABNORMAL LOW (ref >60)
GFR, EST AFRICAN AMERICAN: 29 mL/min — AB (ref >60)
Glucose, Bld: 82 mg/dL (ref 70–99)
POTASSIUM: 4.7 mmol/L (ref 3.5–5.1)
Sodium: 141 mmol/L (ref 135–145)

## 2015-03-16 NOTE — Progress Notes (Signed)
Subjective: Interval History: Patient denies any difficulty in breathing and her appetite is good . She denies any nasuea or vomiting  Objective: Vital signs in last 24 hours: Temp:  [97.7 F (36.5 C)-98.4 F (36.9 C)] 98.2 F (36.8 C) (05/08 8675) Pulse Rate:  [65] 65 (05/08 0614) Resp:  [16-18] 18 (05/08 0614) BP: (103-125)/(45-70) 103/45 mmHg (05/08 0614) SpO2:  [98 %-99 %] 98 % (05/08 0614) Weight change:   Intake/Output from previous day: 05/07 0701 - 05/08 0700 In: 1452.1 [P.O.:120; I.V.:1332.1] Out: 1150 [Urine:1050; Stool:100] Intake/Output this shift:    Generally: Patient is alert and in no apparent distress. Chest is clear to auscultation Heart exam revealed regular rate and rhythm murmur Extremities no edema  Lab Results:  Recent Labs  03/14/15 0502 03/15/15 0505  WBC 7.6 9.2  HGB 9.0* 9.0*  HCT 26.3* 26.4*  PLT 228 226   BMET:   Recent Labs  03/14/15 0502 03/16/15 0551  NA 140 141  K 3.3* 4.7  CL 108 114*  CO2 23 23  GLUCOSE 139* 82  BUN 48* 40*  CREATININE 2.27* 1.90*  CALCIUM 8.6* 8.3*   No results for input(s): PTH in the last 72 hours. Iron Studies: No results for input(s): IRON, TIBC, TRANSFERRIN, FERRITIN in the last 72 hours.  Studies/Results: No results found.  I have reviewed the patient's current medications.  Assessment/Plan: Problem #1 acute kidney injury: Possibly prerenal versus ATN. Her renal function continued to improve.It has returned to her base line Problem #2 metabolic acidosis:.Her CO2 is 23. Has reached our target goal. She is on sodium bicarbonate Problem #3 anemia: Her hemoglobin is below our target goal but stable. Problem #4 history of hypotension: Her blood pressure is better Problem #5 history of diabetes: Reasonably controlled Problem #7 history of oral cancer Problem #7 history of CVA Problem #8 hypokalemia: Has corrected. Patient is on potassium suppement Plan:1] D/C IVf and encourage her po  intake We'll D/C her potassium supplement 2] I will follow her in 4 weeks as an out patient 3Since her renal function has returned to her base line I will sign off.  Thank you.    LOS: 6 days   Lonnell Chaput S 03/16/2015,9:09 AM

## 2015-03-16 NOTE — Progress Notes (Signed)
Patient discharging home.  IVs removed - WNL.  Reviewed medications, no new prescriptions.  Instructed to follow up with nephrology and PCP.  HH orders faxed and made aware of DC.  Instructed to drink plenty of fluids to stay hydrated and prevent dehydration.  Verbalizes understanding.  No questions at this time.  Stable to go home.  Trying to contact daughter for pick up.

## 2015-03-16 NOTE — Discharge Summary (Signed)
Physician Discharge Summary  [name] [name] MGQ:[id number] DOB: August 30, 1941 DOA: 03/10/2015  PCP: Glo Herring., MD  Admit date: 03/10/2015 Discharge date: 03/16/2015  Time spent: 45 minutes  Recommendations for Outpatient Follow-up:  -Will be discharged home today in stable condition. - Advised to follow-up with primary care provider in 2 weeks and with Dr. Hinda Lenis in 4 weeks. -She has also been advised to schedule follow-up with her oncologist at [facility].  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  Hospital Course:   Hypovolemic shock -Secondary to decreased oral intake in turn related to oral cavity squamous cell carcinoma. -Has not required pressors for the past 72 hours. -Stress dose steroids have been weaned completely.  Acute on chronic kidney disease stage 3-4 -Presumed secondary to volume depletion in the setting of ACE inhibitor use, also possibly ATN given hypovolemic shock. -Creatinine is back down to her baseline of 1.9.  Metabolic acidosis -Secondary to renal failure, resolved, required bicarbonate drip.  Hypokalemia/hypomagnesemia -Repleted orally.  Dysphagia -Seen in consultation by speech therapy with recommendations for a dysphagia 2  diet with thin liquids.  Anemia of chronic disease -Secondary to chronic kidney disease. -Hemoglobin remains at baseline of 9-10.  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   Procedures:  None   Consultations:  Renal, Dr. Hinda Lenis  Discharge Instructions  Discharge Instructions    Increase activity slowly    Complete by:  As directed             Medication List    STOP taking these medications        lisinopril 2.5 MG tablet  Commonly known as:  PRINIVIL,ZESTRIL     metoprolol tartrate 25 MG tablet  Commonly known as:  LOPRESSOR      TAKE these medications        acetaminophen 500 MG tablet  Commonly known as:  TYLENOL  Take 1 tablet (500 mg total) by mouth every 6 (six) hours as needed for mild pain.     albuterol 108 (90 BASE) MCG/ACT inhaler  Commonly known as:  PROVENTIL HFA;VENTOLIN HFA  Inhale 2 puffs into the lungs every 6 (six) hours as needed for wheezing or shortness of breath.     ALPRAZolam 0.5 MG tablet  Commonly known as:  XANAX  Take 1 tablet (0.5 mg total) by mouth 4 (four) times daily as needed for anxiety or sleep.     clopidogrel 75 MG tablet  Commonly known as:  PLAVIX  Take 1 tablet (75 mg total) by mouth daily.     escitalopram 20 MG tablet  Commonly known as:  LEXAPRO  Take 20 mg by mouth daily.  ezetimibe 10 MG tablet  Commonly known as:  ZETIA  Take 10 mg by mouth daily.     feeding supplement (RESOURCE BREEZE) Liqd  Take 1 Container by mouth 3 (three) times daily between meals.     HYDROcodone-acetaminophen 10-325 MG per tablet  Commonly known as:  NORCO  Take 1 tablet by mouth every 6 (six) hours as needed for moderate pain.     lidocaine 2 % solution  Commonly known as:  XYLOCAINE  Use as directed 15 mLs in the mouth or throat as needed for mouth pain (to be used for up to 10 days starting on 10/12/2014).     NITROSTAT 0.4 MG SL tablet    Generic drug:  nitroGLYCERIN  Place 0.4 mg under the tongue every 5 (five) minutes as needed for chest pain.     pantoprazole 40 MG tablet  Commonly known as:  PROTONIX  Take 40 mg by mouth daily.     zolpidem 5 MG tablet  Commonly known as:  AMBIEN  Take 1 tablet (5 mg total) by mouth at bedtime.       Allergies  Allergen Reactions  . Ciprofloxacin Anaphylaxis  . Aspirin Nausea And Vomiting  . Penicillins Nausea And Vomiting  . Propoxyphene N-Acetaminophen Nausea And Vomiting  . Sulfamethoxazole Rash       Follow-up Information    Follow up with [facility].   Contact information:   [address] [place] [address] [phone]       Follow up with [facility], MD In 4 weeks.   Specialty:  Nephrology   Contact information:   [address] [place] [address] [phone]       Follow up with Glo Herring., MD. Schedule an appointment as soon as possible for a visit in 2 weeks.   Specialty:  Internal Medicine   Contact information:   [address] [place] [address] [phone]       Please follow up.   Why:  Please follow up with your oncologist at [facility] within 1-2 weeks.       The results of significant diagnostics from this hospitalization (including imaging, microbiology, ancillary and laboratory) are listed below for reference.    Significant Diagnostic Studies: Dg Chest Portable 1 View  03/10/2015   CLINICAL DATA:  Hypotension and fatigue. Weakness and fatigue. Symptoms for 1 week.  EXAM: PORTABLE CHEST - 1 VIEW  COMPARISON:  12/23/2014.  FINDINGS: Cardiopericardial silhouette within normal limits. Mediastinal contours normal. Trachea midline. No airspace disease or effusion. Monitoring leads project over the chest. Surgical clips are present at the gastroesophageal junction, possibly for prior fundoplication. Removal of the PICC line seen on the most recent comparison. Probable granulomatous  calcifications in the RIGHT hilum and RIGHT lung.  IMPRESSION: No active disease.   Electronically Signed   By: Dereck Ligas M.D.   On: 03/10/2015 17:18    Microbiology: Recent Results (from the past 240 hour(s))  MRSA PCR Screening     Status: None   Collection Time: 03/10/15  7:53 PM  Result Value Ref Range Status   MRSA by PCR NEGATIVE NEGATIVE Final    Comment:        The GeneXpert MRSA Assay (FDA approved for NASAL specimens only), is one component of a comprehensive MRSA colonization surveillance program. It is not intended to diagnose MRSA infection nor to guide or monitor treatment for MRSA infections.      Labs: Basic Metabolic Panel:  Recent Labs Lab 03/10/15 2145 03/11/15 0527 03/12/15 0519 03/14/15 0502 03/16/15 0551  NA 134* 135 139 140 141  K 4.5 4.1 3.4* 3.3* 4.7  CL 113* 112* 110 108 114*  CO2 11* 14* 21* 23 23  GLUCOSE 93 151* 132* 139* 82  BUN 93* 91* 79* 48* 40*  CREATININE 6.02* 5.43* 3.97* 2.27* 1.90*  CALCIUM 8.5* 8.8* 8.7* 8.6* 8.3*  MG  --   --  1.8 1.3*  --   PHOS  --   --  5.1*  --   --    Liver Function Tests:  Recent Labs Lab 03/10/15 1708 03/11/15 0527  AST 9* 8*  ALT 10* 9*  ALKPHOS 75 78  BILITOT 0.3 0.5  PROT 6.3* 6.0*  ALBUMIN 3.5 3.3*   No results for input(s): LIPASE, AMYLASE in the last 168 hours. No results for input(s): AMMONIA in the last 168 hours. CBC:  Recent Labs Lab 03/10/15 1708 03/11/15 0527 03/12/15 0519 03/13/15 0456 03/14/15 0502 03/15/15 0505  WBC 9.6 9.4 8.0 7.9 7.6 9.2  NEUTROABS 6.8  --   --   --   --   --   HGB 9.7* 10.3* 9.5* 9.4* 9.0* 9.0*  HCT 29.1* 30.5* 27.4* 27.2* 26.3* 26.4*  MCV 86.4 85.2 83.8 84.5 85.4 86.3  PLT 235 245 246 251 228 226   Cardiac Enzymes: No results for input(s): CKTOTAL, CKMB, CKMBINDEX, TROPONINI in the last 168 hours. BNP: BNP (last 3 results) No results for input(s): BNP in the last 8760 hours.  ProBNP (last 3 results) No results for input(s): PROBNP  in the last 8760 hours.  CBG:  Recent Labs Lab 03/14/15 1110 03/14/15 2019 03/15/15 0738  GLUCAP 172* 149* 90       Signed:  HERNANDEZ ACOSTA,ESTELA  [facility] Pager: [phone] 03/16/2015, 2:51 PM

## 2015-03-18 DIAGNOSIS — N184 Chronic kidney disease, stage 4 (severe): Secondary | ICD-10-CM | POA: Diagnosis not present

## 2015-03-18 DIAGNOSIS — I129 Hypertensive chronic kidney disease with stage 1 through stage 4 chronic kidney disease, or unspecified chronic kidney disease: Secondary | ICD-10-CM | POA: Diagnosis not present

## 2015-03-18 DIAGNOSIS — C069 Malignant neoplasm of mouth, unspecified: Secondary | ICD-10-CM | POA: Diagnosis not present

## 2015-03-18 DIAGNOSIS — F329 Major depressive disorder, single episode, unspecified: Secondary | ICD-10-CM | POA: Diagnosis not present

## 2015-03-18 DIAGNOSIS — E119 Type 2 diabetes mellitus without complications: Secondary | ICD-10-CM | POA: Diagnosis not present

## 2015-03-18 DIAGNOSIS — E785 Hyperlipidemia, unspecified: Secondary | ICD-10-CM | POA: Diagnosis not present

## 2015-03-18 DIAGNOSIS — Z87891 Personal history of nicotine dependence: Secondary | ICD-10-CM | POA: Diagnosis not present

## 2015-03-18 DIAGNOSIS — Z433 Encounter for attention to colostomy: Secondary | ICD-10-CM | POA: Diagnosis not present

## 2015-03-18 DIAGNOSIS — K219 Gastro-esophageal reflux disease without esophagitis: Secondary | ICD-10-CM | POA: Diagnosis not present

## 2015-03-18 DIAGNOSIS — R1310 Dysphagia, unspecified: Secondary | ICD-10-CM | POA: Diagnosis not present

## 2015-03-20 DIAGNOSIS — Z433 Encounter for attention to colostomy: Secondary | ICD-10-CM | POA: Diagnosis not present

## 2015-03-20 DIAGNOSIS — Z87891 Personal history of nicotine dependence: Secondary | ICD-10-CM | POA: Diagnosis not present

## 2015-03-20 DIAGNOSIS — R1310 Dysphagia, unspecified: Secondary | ICD-10-CM | POA: Diagnosis not present

## 2015-03-20 DIAGNOSIS — E785 Hyperlipidemia, unspecified: Secondary | ICD-10-CM | POA: Diagnosis not present

## 2015-03-20 DIAGNOSIS — N184 Chronic kidney disease, stage 4 (severe): Secondary | ICD-10-CM | POA: Diagnosis not present

## 2015-03-20 DIAGNOSIS — F329 Major depressive disorder, single episode, unspecified: Secondary | ICD-10-CM | POA: Diagnosis not present

## 2015-03-20 DIAGNOSIS — C069 Malignant neoplasm of mouth, unspecified: Secondary | ICD-10-CM | POA: Diagnosis not present

## 2015-03-20 DIAGNOSIS — E119 Type 2 diabetes mellitus without complications: Secondary | ICD-10-CM | POA: Diagnosis not present

## 2015-03-20 DIAGNOSIS — K219 Gastro-esophageal reflux disease without esophagitis: Secondary | ICD-10-CM | POA: Diagnosis not present

## 2015-03-20 DIAGNOSIS — I129 Hypertensive chronic kidney disease with stage 1 through stage 4 chronic kidney disease, or unspecified chronic kidney disease: Secondary | ICD-10-CM | POA: Diagnosis not present

## 2015-03-21 DIAGNOSIS — K219 Gastro-esophageal reflux disease without esophagitis: Secondary | ICD-10-CM | POA: Diagnosis not present

## 2015-03-21 DIAGNOSIS — E785 Hyperlipidemia, unspecified: Secondary | ICD-10-CM | POA: Diagnosis not present

## 2015-03-21 DIAGNOSIS — E119 Type 2 diabetes mellitus without complications: Secondary | ICD-10-CM | POA: Diagnosis not present

## 2015-03-21 DIAGNOSIS — C069 Malignant neoplasm of mouth, unspecified: Secondary | ICD-10-CM | POA: Diagnosis not present

## 2015-03-21 DIAGNOSIS — N184 Chronic kidney disease, stage 4 (severe): Secondary | ICD-10-CM | POA: Diagnosis not present

## 2015-03-21 DIAGNOSIS — Z87891 Personal history of nicotine dependence: Secondary | ICD-10-CM | POA: Diagnosis not present

## 2015-03-21 DIAGNOSIS — I129 Hypertensive chronic kidney disease with stage 1 through stage 4 chronic kidney disease, or unspecified chronic kidney disease: Secondary | ICD-10-CM | POA: Diagnosis not present

## 2015-03-21 DIAGNOSIS — R1310 Dysphagia, unspecified: Secondary | ICD-10-CM | POA: Diagnosis not present

## 2015-03-21 DIAGNOSIS — Z433 Encounter for attention to colostomy: Secondary | ICD-10-CM | POA: Diagnosis not present

## 2015-03-21 DIAGNOSIS — F329 Major depressive disorder, single episode, unspecified: Secondary | ICD-10-CM | POA: Diagnosis not present

## 2015-03-24 DIAGNOSIS — F329 Major depressive disorder, single episode, unspecified: Secondary | ICD-10-CM | POA: Diagnosis not present

## 2015-03-24 DIAGNOSIS — K219 Gastro-esophageal reflux disease without esophagitis: Secondary | ICD-10-CM | POA: Diagnosis not present

## 2015-03-24 DIAGNOSIS — R1310 Dysphagia, unspecified: Secondary | ICD-10-CM | POA: Diagnosis not present

## 2015-03-24 DIAGNOSIS — Z433 Encounter for attention to colostomy: Secondary | ICD-10-CM | POA: Diagnosis not present

## 2015-03-24 DIAGNOSIS — I129 Hypertensive chronic kidney disease with stage 1 through stage 4 chronic kidney disease, or unspecified chronic kidney disease: Secondary | ICD-10-CM | POA: Diagnosis not present

## 2015-03-24 DIAGNOSIS — E785 Hyperlipidemia, unspecified: Secondary | ICD-10-CM | POA: Diagnosis not present

## 2015-03-24 DIAGNOSIS — N184 Chronic kidney disease, stage 4 (severe): Secondary | ICD-10-CM | POA: Diagnosis not present

## 2015-03-24 DIAGNOSIS — E119 Type 2 diabetes mellitus without complications: Secondary | ICD-10-CM | POA: Diagnosis not present

## 2015-03-24 DIAGNOSIS — C069 Malignant neoplasm of mouth, unspecified: Secondary | ICD-10-CM | POA: Diagnosis not present

## 2015-03-24 DIAGNOSIS — Z87891 Personal history of nicotine dependence: Secondary | ICD-10-CM | POA: Diagnosis not present

## 2015-03-27 ENCOUNTER — Other Ambulatory Visit (HOSPITAL_COMMUNITY): Payer: Self-pay | Admitting: Physician Assistant

## 2015-03-27 DIAGNOSIS — G894 Chronic pain syndrome: Secondary | ICD-10-CM | POA: Diagnosis not present

## 2015-03-27 DIAGNOSIS — J45909 Unspecified asthma, uncomplicated: Secondary | ICD-10-CM | POA: Diagnosis not present

## 2015-03-27 DIAGNOSIS — E785 Hyperlipidemia, unspecified: Secondary | ICD-10-CM | POA: Diagnosis not present

## 2015-03-27 DIAGNOSIS — C069 Malignant neoplasm of mouth, unspecified: Secondary | ICD-10-CM | POA: Diagnosis not present

## 2015-03-27 DIAGNOSIS — K219 Gastro-esophageal reflux disease without esophagitis: Secondary | ICD-10-CM | POA: Diagnosis not present

## 2015-03-27 DIAGNOSIS — E119 Type 2 diabetes mellitus without complications: Secondary | ICD-10-CM | POA: Diagnosis not present

## 2015-03-27 DIAGNOSIS — R1310 Dysphagia, unspecified: Secondary | ICD-10-CM | POA: Diagnosis not present

## 2015-03-27 DIAGNOSIS — E6609 Other obesity due to excess calories: Secondary | ICD-10-CM | POA: Diagnosis not present

## 2015-03-27 DIAGNOSIS — I129 Hypertensive chronic kidney disease with stage 1 through stage 4 chronic kidney disease, or unspecified chronic kidney disease: Secondary | ICD-10-CM | POA: Diagnosis not present

## 2015-03-27 DIAGNOSIS — N184 Chronic kidney disease, stage 4 (severe): Secondary | ICD-10-CM | POA: Diagnosis not present

## 2015-03-27 DIAGNOSIS — Z933 Colostomy status: Secondary | ICD-10-CM | POA: Diagnosis not present

## 2015-03-27 DIAGNOSIS — F329 Major depressive disorder, single episode, unspecified: Secondary | ICD-10-CM | POA: Diagnosis not present

## 2015-03-27 DIAGNOSIS — Z433 Encounter for attention to colostomy: Secondary | ICD-10-CM | POA: Diagnosis not present

## 2015-03-27 DIAGNOSIS — Z87891 Personal history of nicotine dependence: Secondary | ICD-10-CM | POA: Diagnosis not present

## 2015-03-27 DIAGNOSIS — D649 Anemia, unspecified: Secondary | ICD-10-CM | POA: Diagnosis not present

## 2015-03-27 DIAGNOSIS — Z6834 Body mass index (BMI) 34.0-34.9, adult: Secondary | ICD-10-CM | POA: Diagnosis not present

## 2015-03-27 DIAGNOSIS — M810 Age-related osteoporosis without current pathological fracture: Secondary | ICD-10-CM

## 2015-03-28 DIAGNOSIS — N184 Chronic kidney disease, stage 4 (severe): Secondary | ICD-10-CM | POA: Diagnosis not present

## 2015-03-28 DIAGNOSIS — C069 Malignant neoplasm of mouth, unspecified: Secondary | ICD-10-CM | POA: Diagnosis not present

## 2015-03-28 DIAGNOSIS — F329 Major depressive disorder, single episode, unspecified: Secondary | ICD-10-CM | POA: Diagnosis not present

## 2015-03-28 DIAGNOSIS — Z87891 Personal history of nicotine dependence: Secondary | ICD-10-CM | POA: Diagnosis not present

## 2015-03-28 DIAGNOSIS — R1310 Dysphagia, unspecified: Secondary | ICD-10-CM | POA: Diagnosis not present

## 2015-03-28 DIAGNOSIS — E119 Type 2 diabetes mellitus without complications: Secondary | ICD-10-CM | POA: Diagnosis not present

## 2015-03-28 DIAGNOSIS — E785 Hyperlipidemia, unspecified: Secondary | ICD-10-CM | POA: Diagnosis not present

## 2015-03-28 DIAGNOSIS — I129 Hypertensive chronic kidney disease with stage 1 through stage 4 chronic kidney disease, or unspecified chronic kidney disease: Secondary | ICD-10-CM | POA: Diagnosis not present

## 2015-03-28 DIAGNOSIS — Z433 Encounter for attention to colostomy: Secondary | ICD-10-CM | POA: Diagnosis not present

## 2015-03-28 DIAGNOSIS — K219 Gastro-esophageal reflux disease without esophagitis: Secondary | ICD-10-CM | POA: Diagnosis not present

## 2015-04-01 ENCOUNTER — Ambulatory Visit (HOSPITAL_COMMUNITY)
Admission: RE | Admit: 2015-04-01 | Discharge: 2015-04-01 | Disposition: A | Discharge status: Home / Self Care | Payer: Medicare Other | Source: Ambulatory Visit | Attending: Physician Assistant | Admitting: Physician Assistant

## 2015-04-01 DIAGNOSIS — Z87891 Personal history of nicotine dependence: Secondary | ICD-10-CM | POA: Diagnosis not present

## 2015-04-01 DIAGNOSIS — M810 Age-related osteoporosis without current pathological fracture: Secondary | ICD-10-CM

## 2015-04-01 DIAGNOSIS — C069 Malignant neoplasm of mouth, unspecified: Secondary | ICD-10-CM | POA: Diagnosis not present

## 2015-04-01 DIAGNOSIS — G894 Chronic pain syndrome: Secondary | ICD-10-CM | POA: Diagnosis not present

## 2015-04-01 DIAGNOSIS — Z433 Encounter for attention to colostomy: Secondary | ICD-10-CM | POA: Diagnosis not present

## 2015-04-01 DIAGNOSIS — E785 Hyperlipidemia, unspecified: Secondary | ICD-10-CM | POA: Diagnosis not present

## 2015-04-01 DIAGNOSIS — N184 Chronic kidney disease, stage 4 (severe): Secondary | ICD-10-CM | POA: Diagnosis not present

## 2015-04-01 DIAGNOSIS — I129 Hypertensive chronic kidney disease with stage 1 through stage 4 chronic kidney disease, or unspecified chronic kidney disease: Secondary | ICD-10-CM | POA: Diagnosis not present

## 2015-04-01 DIAGNOSIS — F329 Major depressive disorder, single episode, unspecified: Secondary | ICD-10-CM | POA: Diagnosis not present

## 2015-04-01 DIAGNOSIS — R1310 Dysphagia, unspecified: Secondary | ICD-10-CM | POA: Diagnosis not present

## 2015-04-01 DIAGNOSIS — E119 Type 2 diabetes mellitus without complications: Secondary | ICD-10-CM | POA: Diagnosis not present

## 2015-04-01 DIAGNOSIS — Z6834 Body mass index (BMI) 34.0-34.9, adult: Secondary | ICD-10-CM | POA: Diagnosis not present

## 2015-04-01 DIAGNOSIS — K219 Gastro-esophageal reflux disease without esophagitis: Secondary | ICD-10-CM | POA: Diagnosis not present

## 2015-04-01 DIAGNOSIS — D649 Anemia, unspecified: Secondary | ICD-10-CM | POA: Diagnosis not present

## 2015-04-02 DIAGNOSIS — C069 Malignant neoplasm of mouth, unspecified: Secondary | ICD-10-CM | POA: Diagnosis not present

## 2015-04-02 DIAGNOSIS — Z433 Encounter for attention to colostomy: Secondary | ICD-10-CM | POA: Diagnosis not present

## 2015-04-02 DIAGNOSIS — N184 Chronic kidney disease, stage 4 (severe): Secondary | ICD-10-CM | POA: Diagnosis not present

## 2015-04-02 DIAGNOSIS — F329 Major depressive disorder, single episode, unspecified: Secondary | ICD-10-CM | POA: Diagnosis not present

## 2015-04-02 DIAGNOSIS — Z87891 Personal history of nicotine dependence: Secondary | ICD-10-CM | POA: Diagnosis not present

## 2015-04-02 DIAGNOSIS — I129 Hypertensive chronic kidney disease with stage 1 through stage 4 chronic kidney disease, or unspecified chronic kidney disease: Secondary | ICD-10-CM | POA: Diagnosis not present

## 2015-04-02 DIAGNOSIS — K219 Gastro-esophageal reflux disease without esophagitis: Secondary | ICD-10-CM | POA: Diagnosis not present

## 2015-04-02 DIAGNOSIS — R1310 Dysphagia, unspecified: Secondary | ICD-10-CM | POA: Diagnosis not present

## 2015-04-02 DIAGNOSIS — E119 Type 2 diabetes mellitus without complications: Secondary | ICD-10-CM | POA: Diagnosis not present

## 2015-04-02 DIAGNOSIS — E785 Hyperlipidemia, unspecified: Secondary | ICD-10-CM | POA: Diagnosis not present

## 2015-04-03 DIAGNOSIS — Z87891 Personal history of nicotine dependence: Secondary | ICD-10-CM | POA: Diagnosis not present

## 2015-04-03 DIAGNOSIS — C069 Malignant neoplasm of mouth, unspecified: Secondary | ICD-10-CM | POA: Diagnosis not present

## 2015-04-03 DIAGNOSIS — R1310 Dysphagia, unspecified: Secondary | ICD-10-CM | POA: Diagnosis not present

## 2015-04-03 DIAGNOSIS — E119 Type 2 diabetes mellitus without complications: Secondary | ICD-10-CM | POA: Diagnosis not present

## 2015-04-03 DIAGNOSIS — N184 Chronic kidney disease, stage 4 (severe): Secondary | ICD-10-CM | POA: Diagnosis not present

## 2015-04-03 DIAGNOSIS — K219 Gastro-esophageal reflux disease without esophagitis: Secondary | ICD-10-CM | POA: Diagnosis not present

## 2015-04-03 DIAGNOSIS — Z433 Encounter for attention to colostomy: Secondary | ICD-10-CM | POA: Diagnosis not present

## 2015-04-03 DIAGNOSIS — E785 Hyperlipidemia, unspecified: Secondary | ICD-10-CM | POA: Diagnosis not present

## 2015-04-03 DIAGNOSIS — I129 Hypertensive chronic kidney disease with stage 1 through stage 4 chronic kidney disease, or unspecified chronic kidney disease: Secondary | ICD-10-CM | POA: Diagnosis not present

## 2015-04-03 DIAGNOSIS — F329 Major depressive disorder, single episode, unspecified: Secondary | ICD-10-CM | POA: Diagnosis not present

## 2015-04-11 DIAGNOSIS — F329 Major depressive disorder, single episode, unspecified: Secondary | ICD-10-CM | POA: Diagnosis not present

## 2015-04-11 DIAGNOSIS — Z87891 Personal history of nicotine dependence: Secondary | ICD-10-CM | POA: Diagnosis not present

## 2015-04-11 DIAGNOSIS — N184 Chronic kidney disease, stage 4 (severe): Secondary | ICD-10-CM | POA: Diagnosis not present

## 2015-04-11 DIAGNOSIS — I129 Hypertensive chronic kidney disease with stage 1 through stage 4 chronic kidney disease, or unspecified chronic kidney disease: Secondary | ICD-10-CM | POA: Diagnosis not present

## 2015-04-11 DIAGNOSIS — C069 Malignant neoplasm of mouth, unspecified: Secondary | ICD-10-CM | POA: Diagnosis not present

## 2015-04-11 DIAGNOSIS — E119 Type 2 diabetes mellitus without complications: Secondary | ICD-10-CM | POA: Diagnosis not present

## 2015-04-11 DIAGNOSIS — E785 Hyperlipidemia, unspecified: Secondary | ICD-10-CM | POA: Diagnosis not present

## 2015-04-11 DIAGNOSIS — R1310 Dysphagia, unspecified: Secondary | ICD-10-CM | POA: Diagnosis not present

## 2015-04-11 DIAGNOSIS — Z433 Encounter for attention to colostomy: Secondary | ICD-10-CM | POA: Diagnosis not present

## 2015-04-11 DIAGNOSIS — K219 Gastro-esophageal reflux disease without esophagitis: Secondary | ICD-10-CM | POA: Diagnosis not present

## 2015-04-16 DIAGNOSIS — J449 Chronic obstructive pulmonary disease, unspecified: Secondary | ICD-10-CM | POA: Diagnosis not present

## 2015-04-16 DIAGNOSIS — R269 Unspecified abnormalities of gait and mobility: Secondary | ICD-10-CM | POA: Diagnosis not present

## 2015-04-16 DIAGNOSIS — I509 Heart failure, unspecified: Secondary | ICD-10-CM | POA: Diagnosis not present

## 2015-04-16 DIAGNOSIS — Z933 Colostomy status: Secondary | ICD-10-CM | POA: Diagnosis not present

## 2015-04-22 IMAGING — CR DG CHEST 1V PORT
1 series · 1 of 1 positions shown · non-contrast
Comparison: 10/21/2014

CLINICAL DATA: COPD. Atrial fibrillation confusion and weakness.
Previous stroke.

EXAM:
PORTABLE CHEST - 1 VIEW

[portable]
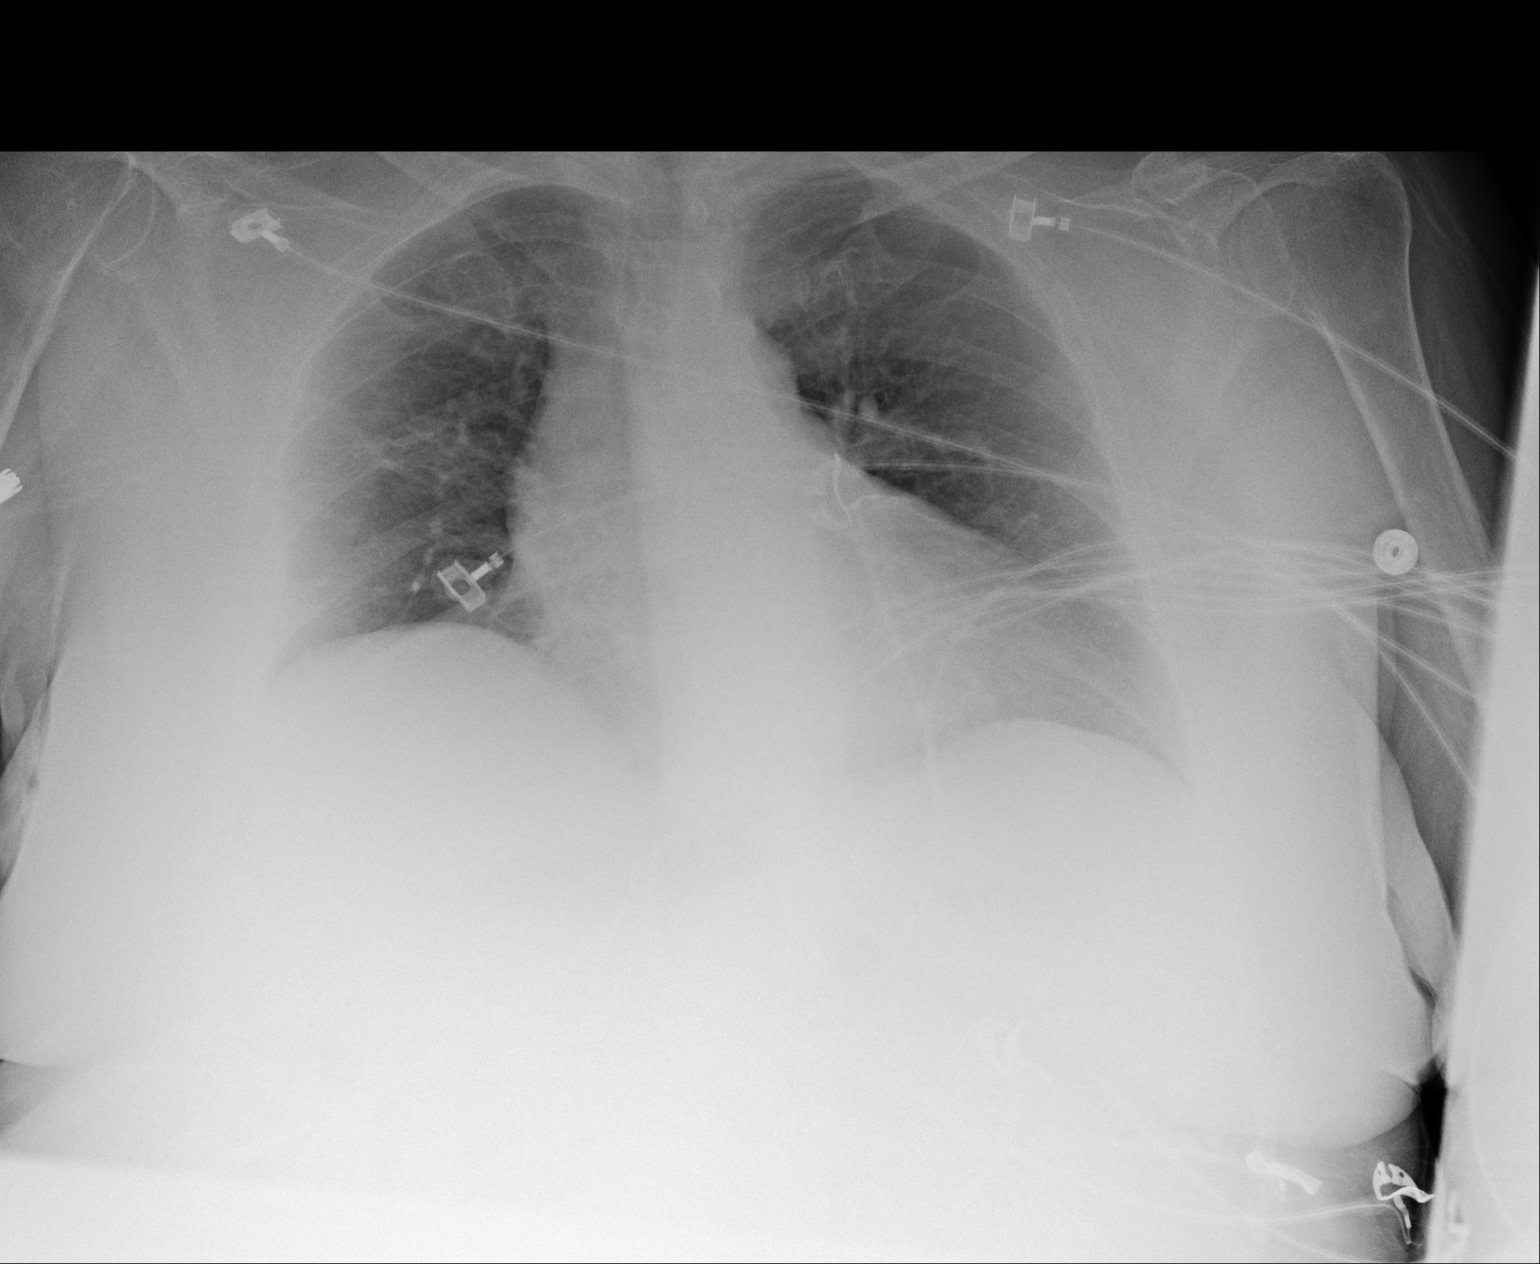

[1 of 1 positions shown; findings below may reference images not displayed]

FINDINGS: The heart size and mediastinal contours are within normal limits.
Low lung volumes are noted, however both lungs are clear. No
evidence of pleural effusion.
IMPRESSION: No active disease.

## 2015-05-15 DIAGNOSIS — Z961 Presence of intraocular lens: Secondary | ICD-10-CM | POA: Diagnosis not present

## 2015-05-15 DIAGNOSIS — E119 Type 2 diabetes mellitus without complications: Secondary | ICD-10-CM | POA: Diagnosis not present

## 2015-05-16 DIAGNOSIS — I509 Heart failure, unspecified: Secondary | ICD-10-CM | POA: Diagnosis not present

## 2015-05-16 DIAGNOSIS — R269 Unspecified abnormalities of gait and mobility: Secondary | ICD-10-CM | POA: Diagnosis not present

## 2015-05-16 DIAGNOSIS — Z933 Colostomy status: Secondary | ICD-10-CM | POA: Diagnosis not present

## 2015-05-16 DIAGNOSIS — J449 Chronic obstructive pulmonary disease, unspecified: Secondary | ICD-10-CM | POA: Diagnosis not present

## 2015-06-16 DIAGNOSIS — J449 Chronic obstructive pulmonary disease, unspecified: Secondary | ICD-10-CM | POA: Diagnosis not present

## 2015-06-16 DIAGNOSIS — Z933 Colostomy status: Secondary | ICD-10-CM | POA: Diagnosis not present

## 2015-06-16 DIAGNOSIS — R269 Unspecified abnormalities of gait and mobility: Secondary | ICD-10-CM | POA: Diagnosis not present

## 2015-06-16 DIAGNOSIS — I509 Heart failure, unspecified: Secondary | ICD-10-CM | POA: Diagnosis not present

## 2015-06-20 DIAGNOSIS — E119 Type 2 diabetes mellitus without complications: Secondary | ICD-10-CM | POA: Diagnosis not present

## 2015-06-20 DIAGNOSIS — Z933 Colostomy status: Secondary | ICD-10-CM | POA: Diagnosis not present

## 2015-06-30 DIAGNOSIS — R809 Proteinuria, unspecified: Secondary | ICD-10-CM | POA: Diagnosis not present

## 2015-06-30 DIAGNOSIS — Z79899 Other long term (current) drug therapy: Secondary | ICD-10-CM | POA: Diagnosis not present

## 2015-06-30 DIAGNOSIS — N183 Chronic kidney disease, stage 3 (moderate): Secondary | ICD-10-CM | POA: Diagnosis not present

## 2015-06-30 DIAGNOSIS — D649 Anemia, unspecified: Secondary | ICD-10-CM | POA: Diagnosis not present

## 2015-06-30 DIAGNOSIS — E559 Vitamin D deficiency, unspecified: Secondary | ICD-10-CM | POA: Diagnosis not present

## 2015-06-30 DIAGNOSIS — I10 Essential (primary) hypertension: Secondary | ICD-10-CM | POA: Diagnosis not present

## 2015-07-02 DIAGNOSIS — I10 Essential (primary) hypertension: Secondary | ICD-10-CM | POA: Diagnosis not present

## 2015-07-02 DIAGNOSIS — N183 Chronic kidney disease, stage 3 (moderate): Secondary | ICD-10-CM | POA: Diagnosis not present

## 2015-07-02 DIAGNOSIS — E559 Vitamin D deficiency, unspecified: Secondary | ICD-10-CM | POA: Diagnosis not present

## 2015-07-02 DIAGNOSIS — N2581 Secondary hyperparathyroidism of renal origin: Secondary | ICD-10-CM | POA: Diagnosis not present

## 2015-07-16 DIAGNOSIS — E1129 Type 2 diabetes mellitus with other diabetic kidney complication: Secondary | ICD-10-CM | POA: Diagnosis not present

## 2015-07-16 DIAGNOSIS — G894 Chronic pain syndrome: Secondary | ICD-10-CM | POA: Diagnosis not present

## 2015-07-16 DIAGNOSIS — Z1389 Encounter for screening for other disorder: Secondary | ICD-10-CM | POA: Diagnosis not present

## 2015-07-16 DIAGNOSIS — Z6834 Body mass index (BMI) 34.0-34.9, adult: Secondary | ICD-10-CM | POA: Diagnosis not present

## 2015-07-16 DIAGNOSIS — I10 Essential (primary) hypertension: Secondary | ICD-10-CM | POA: Diagnosis not present

## 2015-07-16 DIAGNOSIS — J449 Chronic obstructive pulmonary disease, unspecified: Secondary | ICD-10-CM | POA: Diagnosis not present

## 2015-07-16 DIAGNOSIS — E782 Mixed hyperlipidemia: Secondary | ICD-10-CM | POA: Diagnosis not present

## 2015-07-17 DIAGNOSIS — Z933 Colostomy status: Secondary | ICD-10-CM | POA: Diagnosis not present

## 2015-07-17 DIAGNOSIS — R269 Unspecified abnormalities of gait and mobility: Secondary | ICD-10-CM | POA: Diagnosis not present

## 2015-07-17 DIAGNOSIS — I509 Heart failure, unspecified: Secondary | ICD-10-CM | POA: Diagnosis not present

## 2015-07-17 DIAGNOSIS — J449 Chronic obstructive pulmonary disease, unspecified: Secondary | ICD-10-CM | POA: Diagnosis not present

## 2015-07-28 DIAGNOSIS — E119 Type 2 diabetes mellitus without complications: Secondary | ICD-10-CM | POA: Diagnosis not present

## 2015-07-28 DIAGNOSIS — R32 Unspecified urinary incontinence: Secondary | ICD-10-CM | POA: Diagnosis not present

## 2015-07-28 DIAGNOSIS — J449 Chronic obstructive pulmonary disease, unspecified: Secondary | ICD-10-CM | POA: Diagnosis not present

## 2015-07-28 DIAGNOSIS — I509 Heart failure, unspecified: Secondary | ICD-10-CM | POA: Diagnosis not present

## 2015-08-16 DIAGNOSIS — J449 Chronic obstructive pulmonary disease, unspecified: Secondary | ICD-10-CM | POA: Diagnosis not present

## 2015-08-16 DIAGNOSIS — I509 Heart failure, unspecified: Secondary | ICD-10-CM | POA: Diagnosis not present

## 2015-08-16 DIAGNOSIS — R269 Unspecified abnormalities of gait and mobility: Secondary | ICD-10-CM | POA: Diagnosis not present

## 2015-08-16 DIAGNOSIS — Z933 Colostomy status: Secondary | ICD-10-CM | POA: Diagnosis not present

## 2015-08-26 DIAGNOSIS — C060 Malignant neoplasm of cheek mucosa: Secondary | ICD-10-CM | POA: Diagnosis not present

## 2015-08-26 DIAGNOSIS — K1321 Leukoplakia of oral mucosa, including tongue: Secondary | ICD-10-CM | POA: Diagnosis not present

## 2015-08-29 DIAGNOSIS — I509 Heart failure, unspecified: Secondary | ICD-10-CM | POA: Diagnosis not present

## 2015-08-29 DIAGNOSIS — J449 Chronic obstructive pulmonary disease, unspecified: Secondary | ICD-10-CM | POA: Diagnosis not present

## 2015-08-29 DIAGNOSIS — E119 Type 2 diabetes mellitus without complications: Secondary | ICD-10-CM | POA: Diagnosis not present

## 2015-09-16 DIAGNOSIS — J449 Chronic obstructive pulmonary disease, unspecified: Secondary | ICD-10-CM | POA: Diagnosis not present

## 2015-09-16 DIAGNOSIS — Z933 Colostomy status: Secondary | ICD-10-CM | POA: Diagnosis not present

## 2015-09-16 DIAGNOSIS — R269 Unspecified abnormalities of gait and mobility: Secondary | ICD-10-CM | POA: Diagnosis not present

## 2015-09-16 DIAGNOSIS — I509 Heart failure, unspecified: Secondary | ICD-10-CM | POA: Diagnosis not present

## 2015-09-30 DIAGNOSIS — I509 Heart failure, unspecified: Secondary | ICD-10-CM | POA: Diagnosis not present

## 2015-09-30 DIAGNOSIS — E119 Type 2 diabetes mellitus without complications: Secondary | ICD-10-CM | POA: Diagnosis not present

## 2015-09-30 DIAGNOSIS — J449 Chronic obstructive pulmonary disease, unspecified: Secondary | ICD-10-CM | POA: Diagnosis not present

## 2015-10-16 DIAGNOSIS — J449 Chronic obstructive pulmonary disease, unspecified: Secondary | ICD-10-CM | POA: Diagnosis not present

## 2015-10-16 DIAGNOSIS — R269 Unspecified abnormalities of gait and mobility: Secondary | ICD-10-CM | POA: Diagnosis not present

## 2015-10-16 DIAGNOSIS — Z933 Colostomy status: Secondary | ICD-10-CM | POA: Diagnosis not present

## 2015-10-16 DIAGNOSIS — I509 Heart failure, unspecified: Secondary | ICD-10-CM | POA: Diagnosis not present

## 2015-10-22 DIAGNOSIS — C060 Malignant neoplasm of cheek mucosa: Secondary | ICD-10-CM | POA: Diagnosis not present

## 2015-10-30 DIAGNOSIS — E119 Type 2 diabetes mellitus without complications: Secondary | ICD-10-CM | POA: Diagnosis not present

## 2015-10-30 DIAGNOSIS — R32 Unspecified urinary incontinence: Secondary | ICD-10-CM | POA: Diagnosis not present

## 2015-10-30 DIAGNOSIS — J449 Chronic obstructive pulmonary disease, unspecified: Secondary | ICD-10-CM | POA: Diagnosis not present

## 2015-10-30 DIAGNOSIS — I509 Heart failure, unspecified: Secondary | ICD-10-CM | POA: Diagnosis not present

## 2015-11-04 DIAGNOSIS — Z23 Encounter for immunization: Secondary | ICD-10-CM | POA: Diagnosis not present

## 2015-11-04 DIAGNOSIS — Z0001 Encounter for general adult medical examination with abnormal findings: Secondary | ICD-10-CM | POA: Diagnosis not present

## 2015-11-04 DIAGNOSIS — D649 Anemia, unspecified: Secondary | ICD-10-CM | POA: Diagnosis not present

## 2015-11-04 DIAGNOSIS — N189 Chronic kidney disease, unspecified: Secondary | ICD-10-CM | POA: Diagnosis not present

## 2015-11-04 DIAGNOSIS — Z1389 Encounter for screening for other disorder: Secondary | ICD-10-CM | POA: Diagnosis not present

## 2015-11-04 DIAGNOSIS — Z6837 Body mass index (BMI) 37.0-37.9, adult: Secondary | ICD-10-CM | POA: Diagnosis not present

## 2015-11-04 DIAGNOSIS — C069 Malignant neoplasm of mouth, unspecified: Secondary | ICD-10-CM | POA: Diagnosis not present

## 2015-11-04 DIAGNOSIS — K9409 Other complications of colostomy: Secondary | ICD-10-CM | POA: Diagnosis not present

## 2015-11-16 DIAGNOSIS — R269 Unspecified abnormalities of gait and mobility: Secondary | ICD-10-CM | POA: Diagnosis not present

## 2015-11-16 DIAGNOSIS — Z933 Colostomy status: Secondary | ICD-10-CM | POA: Diagnosis not present

## 2015-11-16 DIAGNOSIS — I509 Heart failure, unspecified: Secondary | ICD-10-CM | POA: Diagnosis not present

## 2015-11-16 DIAGNOSIS — J449 Chronic obstructive pulmonary disease, unspecified: Secondary | ICD-10-CM | POA: Diagnosis not present

## 2015-11-24 DIAGNOSIS — Z933 Colostomy status: Secondary | ICD-10-CM | POA: Diagnosis not present

## 2015-12-01 DIAGNOSIS — J449 Chronic obstructive pulmonary disease, unspecified: Secondary | ICD-10-CM | POA: Diagnosis not present

## 2015-12-01 DIAGNOSIS — I509 Heart failure, unspecified: Secondary | ICD-10-CM | POA: Diagnosis not present

## 2015-12-01 DIAGNOSIS — E119 Type 2 diabetes mellitus without complications: Secondary | ICD-10-CM | POA: Diagnosis not present

## 2015-12-31 DIAGNOSIS — J449 Chronic obstructive pulmonary disease, unspecified: Secondary | ICD-10-CM | POA: Diagnosis not present

## 2015-12-31 DIAGNOSIS — E119 Type 2 diabetes mellitus without complications: Secondary | ICD-10-CM | POA: Diagnosis not present

## 2015-12-31 DIAGNOSIS — I509 Heart failure, unspecified: Secondary | ICD-10-CM | POA: Diagnosis not present

## 2016-01-28 DIAGNOSIS — R32 Unspecified urinary incontinence: Secondary | ICD-10-CM | POA: Diagnosis not present

## 2016-01-28 DIAGNOSIS — E119 Type 2 diabetes mellitus without complications: Secondary | ICD-10-CM | POA: Diagnosis not present

## 2016-01-28 DIAGNOSIS — J449 Chronic obstructive pulmonary disease, unspecified: Secondary | ICD-10-CM | POA: Diagnosis not present

## 2016-01-28 DIAGNOSIS — I509 Heart failure, unspecified: Secondary | ICD-10-CM | POA: Diagnosis not present

## 2016-02-02 DIAGNOSIS — R809 Proteinuria, unspecified: Secondary | ICD-10-CM | POA: Diagnosis not present

## 2016-02-02 DIAGNOSIS — I10 Essential (primary) hypertension: Secondary | ICD-10-CM | POA: Diagnosis not present

## 2016-02-02 DIAGNOSIS — Z79899 Other long term (current) drug therapy: Secondary | ICD-10-CM | POA: Diagnosis not present

## 2016-02-02 DIAGNOSIS — D509 Iron deficiency anemia, unspecified: Secondary | ICD-10-CM | POA: Diagnosis not present

## 2016-02-02 DIAGNOSIS — E559 Vitamin D deficiency, unspecified: Secondary | ICD-10-CM | POA: Diagnosis not present

## 2016-02-02 DIAGNOSIS — N183 Chronic kidney disease, stage 3 (moderate): Secondary | ICD-10-CM | POA: Diagnosis not present

## 2016-02-11 DIAGNOSIS — I2510 Atherosclerotic heart disease of native coronary artery without angina pectoris: Secondary | ICD-10-CM | POA: Diagnosis not present

## 2016-02-25 DIAGNOSIS — C060 Malignant neoplasm of cheek mucosa: Secondary | ICD-10-CM | POA: Diagnosis not present

## 2016-02-28 DIAGNOSIS — E119 Type 2 diabetes mellitus without complications: Secondary | ICD-10-CM | POA: Diagnosis not present

## 2016-02-28 DIAGNOSIS — I509 Heart failure, unspecified: Secondary | ICD-10-CM | POA: Diagnosis not present

## 2016-02-28 DIAGNOSIS — J449 Chronic obstructive pulmonary disease, unspecified: Secondary | ICD-10-CM | POA: Diagnosis not present

## 2016-02-28 DIAGNOSIS — R32 Unspecified urinary incontinence: Secondary | ICD-10-CM | POA: Diagnosis not present

## 2016-03-02 DIAGNOSIS — I10 Essential (primary) hypertension: Secondary | ICD-10-CM | POA: Diagnosis not present

## 2016-03-02 DIAGNOSIS — D649 Anemia, unspecified: Secondary | ICD-10-CM | POA: Diagnosis not present

## 2016-03-02 DIAGNOSIS — E1129 Type 2 diabetes mellitus with other diabetic kidney complication: Secondary | ICD-10-CM | POA: Diagnosis not present

## 2016-03-02 DIAGNOSIS — N183 Chronic kidney disease, stage 3 (moderate): Secondary | ICD-10-CM | POA: Diagnosis not present

## 2016-03-04 DIAGNOSIS — N184 Chronic kidney disease, stage 4 (severe): Secondary | ICD-10-CM | POA: Diagnosis not present

## 2016-03-04 DIAGNOSIS — E1129 Type 2 diabetes mellitus with other diabetic kidney complication: Secondary | ICD-10-CM | POA: Diagnosis not present

## 2016-03-04 DIAGNOSIS — Z1389 Encounter for screening for other disorder: Secondary | ICD-10-CM | POA: Diagnosis not present

## 2016-03-23 ENCOUNTER — Encounter: Payer: Self-pay | Admitting: Cardiology

## 2016-03-23 ENCOUNTER — Ambulatory Visit (INDEPENDENT_AMBULATORY_CARE_PROVIDER_SITE_OTHER): Payer: Medicare Other | Admitting: Cardiology

## 2016-03-23 VITALS — BP 118/68 | HR 73 | Ht 61.0 in | Wt 190.0 lb

## 2016-03-23 DIAGNOSIS — E785 Hyperlipidemia, unspecified: Secondary | ICD-10-CM | POA: Diagnosis not present

## 2016-03-23 DIAGNOSIS — I2510 Atherosclerotic heart disease of native coronary artery without angina pectoris: Secondary | ICD-10-CM

## 2016-03-23 NOTE — Patient Instructions (Signed)

## 2016-03-23 NOTE — Progress Notes (Addendum)
Patient ID: Cathi Roan, female   DOB: Jul 19, 1941, 75 y.o.   MRN: [id number]     Clinical Summary Ms. Khang is a 75 y.o.female seen today for follow up of the following medical problems.   1. Nonobstructive CAD - non-obstructive CAD from cath in 2003 - metoprolol and lisinpril stopped in the past after admission with dehdyration and hypotension - denies any chest. No SOB or DOE - compliant with meds. No statin it appeasr due to prior muscle aches.   2. Hx of CVA - on plavix for secondary prevention Past Medical History  Diagnosis Date  . Anxiety   . Depression   . Hypertension   . GERD (gastroesophageal reflux disease)   . Hyperlipidemia   . Myocardial infarction ([place])   . Stroke     times 2  . Diabetes mellitus without complication   . Cancer      Allergies  Allergen Reactions  . Ciprofloxacin Anaphylaxis  . Aspirin Nausea And Vomiting  . Penicillins Nausea And Vomiting  . Propoxyphene N-Acetaminophen Nausea And Vomiting  . Sulfamethoxazole Rash     Current Outpatient Prescriptions  Medication Sig Dispense Refill  . acetaminophen (TYLENOL) 500 MG tablet Take 1 tablet (500 mg total) by mouth every 6 (six) hours as needed for mild pain.    Marland Kitchen albuterol (PROVENTIL HFA;VENTOLIN HFA) 108 (90 BASE) MCG/ACT inhaler Inhale 2 puffs into the lungs every 6 (six) hours as needed for wheezing or shortness of breath.     . ALPRAZolam (XANAX) 0.5 MG tablet Take 1 tablet (0.5 mg total) by mouth 4 (four) times daily as needed for anxiety or sleep. 10 tablet 0  . clopidogrel (PLAVIX) 75 MG tablet Take 1 tablet (75 mg total) by mouth daily. 30 tablet 6  . escitalopram (LEXAPRO) 20 MG tablet Take 20 mg by mouth daily.    Marland Kitchen ezetimibe (ZETIA) 10 MG tablet Take 10 mg by mouth daily.    . feeding supplement, RESOURCE BREEZE, (RESOURCE BREEZE) LIQD Take 1 Container by mouth 3 (three) times daily between meals. (Patient not taking: Reported on 03/10/2015)  0  . HYDROcodone-acetaminophen  (NORCO) 10-325 MG per tablet Take 1 tablet by mouth every 6 (six) hours as needed for moderate pain. (Patient taking differently: Take 0.5-1 tablets by mouth every 6 (six) hours as needed for moderate pain. ) 10 tablet 0  . lidocaine (XYLOCAINE) 2 % solution Use as directed 15 mLs in the mouth or throat as needed for mouth pain (to be used for up to 10 days starting on 10/12/2014).    Marland Kitchen NITROSTAT 0.4 MG SL tablet Place 0.4 mg under the tongue every 5 (five) minutes as needed for chest pain.     . pantoprazole (PROTONIX) 40 MG tablet Take 40 mg by mouth daily.    Marland Kitchen zolpidem (AMBIEN) 5 MG tablet Take 1 tablet (5 mg total) by mouth at bedtime. (Patient taking differently: Take 10 mg by mouth at bedtime. )     No current facility-administered medications for this visit.     Past Surgical History  Procedure Laterality Date  . Abdominal hysterectomy    . Cholecystectomy    . Oophorectomy    . Colostomy    . Tonsillectomy    . Cataract extraction w/phaco Right 03/20/2013    Procedure: CATARACT EXTRACTION PHACO AND INTRAOCULAR LENS PLACEMENT (IOC);  Surgeon: Elta Guadeloupe T. Gershon Crane, MD;  Location: [facility];  Service: Ophthalmology;  Laterality: Right;  CDE:  6.29  Allergies  Allergen Reactions  . Ciprofloxacin Anaphylaxis  . Aspirin Nausea And Vomiting  . Penicillins Nausea And Vomiting  . Propoxyphene N-Acetaminophen Nausea And Vomiting  . Sulfamethoxazole Rash      Family History  Problem Relation Age of Onset  . Cancer Other   . Diabetes Other   . Abnormal EKG Other   . Arthritis Other   . Asthma Other   . Kidney disease Other      Social History Ms. Debow reports that she quit smoking about 43 years ago. Her smoking use included Cigarettes. She started smoking about 59 years ago. She has a 3.75 pack-year smoking history. Her smokeless tobacco use includes Snuff. Ms. Xu reports that she does not drink alcohol.   Review of Systems CONSTITUTIONAL: No weight loss, fever,  chills, weakness or fatigue.  HEENT: Eyes: No visual loss, blurred vision, double vision or yellow sclerae.No hearing loss, sneezing, congestion, runny nose or sore throat.  SKIN: No rash or itching.  CARDIOVASCULAR: per HPI RESPIRATORY: No shortness of breath, cough or sputum.  GASTROINTESTINAL: No anorexia, nausea, vomiting or diarrhea. No abdominal pain or blood.  GENITOURINARY: No burning on urination, no polyuria NEUROLOGICAL: No headache, dizziness, syncope, paralysis, ataxia, numbness or tingling in the extremities. No change in bowel or bladder control.  MUSCULOSKELETAL: No muscle, back pain, joint pain or stiffness.  LYMPHATICS: No enlarged nodes. No history of splenectomy.  PSYCHIATRIC: No history of depression or anxiety.  ENDOCRINOLOGIC: No reports of sweating, cold or heat intolerance. No polyuria or polydipsia.  Marland Kitchen   Physical Examination Filed Vitals:   03/23/16 1252  BP: 118/68  Pulse: 73   Filed Vitals:   03/23/16 1252  Height: 5\' 1"  (1.549 m)  Weight: 190 lb (86.183 kg)    Gen: resting comfortably, no acute distress HEENT: no scleral icterus, pupils equal round and reactive, no palptable cervical adenopathy,  CV: RRR, 2/6 sysotlc murmur RUSB, no jvd Resp: Clear to auscultation bilaterally GI: abdomen is soft, non-tender, non-distended, normal bowel sounds, no hepatosplenomegaly MSK: extremities are warm, no edema.  Skin: warm, no rash Neuro:  no focal deficits Psych: appropriate affect   Diagnostic Studies Jan 2014 Echo Study Conclusions  - Left ventricle: The cavity size was normal. There was mild concentric hypertrophy. Systolic function was vigorous. The estimated ejection fraction was in the range of 65% to 70%. Wall motion was normal; there were no regional wall motion abnormalities. - Aortic valve: Mildly to moderately calcified annulus. Mildly thickened, mildly calcified leaflets. There wastrivial, if any,stenosis. - Mitral valve:  Calcified annulus. - Left atrium: The atrium was mildly dilated. - Right ventricle: The cavity size was normal. Wall thickness was moderately increased. - Atrial septum: No defect or patent foramen ovale was identified. Impressions:  - Compared to the prior study performed 08/11/11, there has been no significant interval change.  12/2013 Carotid US IMPRESSION: 1. Mild heterogeneous plaque bilaterally without evidence of stenosis or interval progression compared to 02/26/2010. 2. Vertebral arteries are patent with normal antegrade flow.   06/2002 cath PRESSURES: 1. LV: 150/0; LVEDP 18 mmHg. 2. CA: 150/80 mmHg. 3. There was no gradient across the aortic valve on catheter pullback.  ANGIOGRAPHY: Fluoroscopy did not reveal any significant coronary, intracardiac, or valvular calcification. There was a prosthetic ring in place in the area of the stomach and distal esophagus, presumably from the patient's prior hiatal hernia surgery he had done at [facility] several years ago. 1. The main left coronary was normal. 2.  The left anterior descending artery had minor irregularity with less than  20-30% minimal narrowing beyond the first diagonal Shiquan Mathieu. The distal  third of the LAD was mildly irregular but no significant stenosis or  atherosclerotic lesions with good flow to the apex where it bifurcated. 3. The first diagonal Lahela Woodin arose before SP-1 and 2 bifurcated and it was  normal. 4. The circumflex was nondominant but moderate sized. There was a small OM-  1 that was normal and moderately large OM-2 that bifurcated and was  normal, a moderate sized OM-3 that was normal. Beyond this, there was a  40-50% narrowing at the PABG Itzabella Sorrels which was small and bifurcated. 5. The right coronary was a dominant vessel. There was dilatation a  centimeter after the proximal portion of the ostia. There was no ostial  or proximal stenosis and the  dilatation was mild without aneurysm  formation. The remainder of the vessel was widely patent and smooth with  a small but normal PDA and PLA and normal RV branches. 6. LV angiogram in the RAO and LAO projection showed a normally contracting  ventricle which was vigorous, EF approximately 60% or greater with  angiographic LVH. There was no mitral regurgitation. 7. Abdominal aortic angiogram in the midstream PA projection showed a normal  SMA and celiac access, normal single right renal artery, and dual normal  left renal arteries. The infrarenal abdominal aorta was widely patent  with no stenosis or aneurysm formation and no significant irregularity.  The common iliacs were normal. The proximal external iliacs were normal,  mildly tortuous, and the hypogastrics were intact with good runoff  bilaterally.  DISCUSSION: This 75 year old patient of Dr. Caron Presume has a remote history of catheterization without significant coronary disease over a decade ago at [facility] but these records are not available. She has morbid obesity, a history of diabetes, past CVAs, nonsmoker with COPD and asthma, remote cholecystectomy, and remote hiatal hernia repair presumably for hiatal hernia and GI bleeding several years ago at [place]. [facility]. She is admitted now with chest pain felt to be compatible with ischemia. Diagnostic catheterization shows no significant coronary artery disease. There is mild dilatation in the proximal third of the RCA but no aneurysm formation and no significant stenosis. There is less than 50% lesion of the distal circumflex, good normal LV function and mild hypertension.  RECOMMENDATIONS: I would recommend medical therapy. She will probably need GI evaluation with her history of chest pain and past GI disease.  CATHETERIZATION DIAGNOSES: 1. Chest pain etiology not determined. 2. Possible gastroesophageal reflux disease and/or  esophageal spasm. 3. Remote hiatal hernia repair, [place]. 4. Remote cholecystectomy. 5. Severe morbid obesity. 6. Osteoarthritis. 7. Bilateral cataracts. 8. Adult onset diabetes mellitus. 9. Prior cerebrovascular accidents. 10. Remote hysterectomy. 11. History of palpitations.       Assessment and Plan  1. CAD -nonobstructive CAD by prior cath - EKG in clinic SR without ischemic changes - no recent chest pain symptoms - we will continue current meds.   2. Hyperlipidemia - intolerant to statins, on zetia. We will request pcp labs   F/u 6 months     Arnoldo Lenis, M.D.

## 2016-03-29 DIAGNOSIS — I509 Heart failure, unspecified: Secondary | ICD-10-CM | POA: Diagnosis not present

## 2016-03-29 DIAGNOSIS — J449 Chronic obstructive pulmonary disease, unspecified: Secondary | ICD-10-CM | POA: Diagnosis not present

## 2016-03-29 DIAGNOSIS — R32 Unspecified urinary incontinence: Secondary | ICD-10-CM | POA: Diagnosis not present

## 2016-03-29 DIAGNOSIS — E119 Type 2 diabetes mellitus without complications: Secondary | ICD-10-CM | POA: Diagnosis not present

## 2016-04-16 DIAGNOSIS — Z933 Colostomy status: Secondary | ICD-10-CM | POA: Diagnosis not present

## 2016-04-21 DIAGNOSIS — J449 Chronic obstructive pulmonary disease, unspecified: Secondary | ICD-10-CM | POA: Diagnosis not present

## 2016-04-21 DIAGNOSIS — E119 Type 2 diabetes mellitus without complications: Secondary | ICD-10-CM | POA: Diagnosis not present

## 2016-04-21 DIAGNOSIS — E1129 Type 2 diabetes mellitus with other diabetic kidney complication: Secondary | ICD-10-CM | POA: Diagnosis not present

## 2016-04-21 DIAGNOSIS — Z1389 Encounter for screening for other disorder: Secondary | ICD-10-CM | POA: Diagnosis not present

## 2016-04-21 DIAGNOSIS — F419 Anxiety disorder, unspecified: Secondary | ICD-10-CM | POA: Diagnosis not present

## 2016-04-21 DIAGNOSIS — C069 Malignant neoplasm of mouth, unspecified: Secondary | ICD-10-CM | POA: Diagnosis not present

## 2016-04-21 DIAGNOSIS — I10 Essential (primary) hypertension: Secondary | ICD-10-CM | POA: Diagnosis not present

## 2016-04-29 DIAGNOSIS — I509 Heart failure, unspecified: Secondary | ICD-10-CM | POA: Diagnosis not present

## 2016-04-29 DIAGNOSIS — J449 Chronic obstructive pulmonary disease, unspecified: Secondary | ICD-10-CM | POA: Diagnosis not present

## 2016-04-29 DIAGNOSIS — R32 Unspecified urinary incontinence: Secondary | ICD-10-CM | POA: Diagnosis not present

## 2016-04-29 DIAGNOSIS — E119 Type 2 diabetes mellitus without complications: Secondary | ICD-10-CM | POA: Diagnosis not present

## 2016-05-29 DIAGNOSIS — J449 Chronic obstructive pulmonary disease, unspecified: Secondary | ICD-10-CM | POA: Diagnosis not present

## 2016-05-29 DIAGNOSIS — I509 Heart failure, unspecified: Secondary | ICD-10-CM | POA: Diagnosis not present

## 2016-05-29 DIAGNOSIS — E119 Type 2 diabetes mellitus without complications: Secondary | ICD-10-CM | POA: Diagnosis not present

## 2016-06-16 DIAGNOSIS — J018 Other acute sinusitis: Secondary | ICD-10-CM | POA: Diagnosis not present

## 2016-06-16 DIAGNOSIS — J449 Chronic obstructive pulmonary disease, unspecified: Secondary | ICD-10-CM | POA: Diagnosis not present

## 2016-06-16 DIAGNOSIS — J069 Acute upper respiratory infection, unspecified: Secondary | ICD-10-CM | POA: Diagnosis not present

## 2016-06-16 DIAGNOSIS — I10 Essential (primary) hypertension: Secondary | ICD-10-CM | POA: Diagnosis not present

## 2016-06-16 DIAGNOSIS — G894 Chronic pain syndrome: Secondary | ICD-10-CM | POA: Diagnosis not present

## 2016-06-18 ENCOUNTER — Other Ambulatory Visit (HOSPITAL_COMMUNITY): Payer: Self-pay | Admitting: Internal Medicine

## 2016-06-28 DIAGNOSIS — I10 Essential (primary) hypertension: Secondary | ICD-10-CM | POA: Diagnosis not present

## 2016-06-28 DIAGNOSIS — R809 Proteinuria, unspecified: Secondary | ICD-10-CM | POA: Diagnosis not present

## 2016-06-28 DIAGNOSIS — E1129 Type 2 diabetes mellitus with other diabetic kidney complication: Secondary | ICD-10-CM | POA: Diagnosis not present

## 2016-06-28 DIAGNOSIS — Z79899 Other long term (current) drug therapy: Secondary | ICD-10-CM | POA: Diagnosis not present

## 2016-06-28 DIAGNOSIS — N183 Chronic kidney disease, stage 3 (moderate): Secondary | ICD-10-CM | POA: Diagnosis not present

## 2016-06-29 DIAGNOSIS — K1321 Leukoplakia of oral mucosa, including tongue: Secondary | ICD-10-CM | POA: Diagnosis not present

## 2016-06-29 DIAGNOSIS — Z720 Tobacco use: Secondary | ICD-10-CM | POA: Diagnosis not present

## 2016-06-29 DIAGNOSIS — C060 Malignant neoplasm of cheek mucosa: Secondary | ICD-10-CM | POA: Diagnosis not present

## 2016-07-06 DIAGNOSIS — N183 Chronic kidney disease, stage 3 (moderate): Secondary | ICD-10-CM | POA: Diagnosis not present

## 2016-07-06 DIAGNOSIS — I10 Essential (primary) hypertension: Secondary | ICD-10-CM | POA: Diagnosis not present

## 2016-07-06 DIAGNOSIS — E559 Vitamin D deficiency, unspecified: Secondary | ICD-10-CM | POA: Diagnosis not present

## 2016-07-06 DIAGNOSIS — D649 Anemia, unspecified: Secondary | ICD-10-CM | POA: Diagnosis not present

## 2016-07-06 DIAGNOSIS — E1129 Type 2 diabetes mellitus with other diabetic kidney complication: Secondary | ICD-10-CM | POA: Diagnosis not present

## 2016-07-21 ENCOUNTER — Telehealth: Payer: Self-pay | Admitting: Cardiology

## 2016-07-21 NOTE — Telephone Encounter (Signed)
Patient states that she has been having issues with her BP and HR for a while. Would like return phone call. / tg

## 2016-07-21 NOTE — Telephone Encounter (Signed)
Please start her on norvasc 5mg  daily. She needs to contact her pcp regarding her other symptoms, they do not sound cardiac and high bp alone would not cause these symptoms   Zandra Abts MD

## 2016-07-21 NOTE — Telephone Encounter (Signed)
Returned pt call.Pt  Asked me to speak with the aide that was there helping her. She stated that the pt was started on Vit D3 50,000 UNITS last Tuesday and for the past few days she has not been feeling right. She has been sweaty, dizzy and feeling weird. Pt's aide stated her bp has been steadily rising and she is concerned - Some of her bp's - 146/79, 149/80, 199/87, 175/94, 187/95 and 146/79. Please advise.

## 2016-07-22 MED ORDER — AMLODIPINE BESYLATE 5 MG PO TABS: 5.0000 mg | ORAL_TABLET | Freq: Every day | ORAL | 3 refills | Status: DC

## 2016-07-27 ENCOUNTER — Telehealth: Payer: Self-pay | Admitting: *Deleted

## 2016-07-27 NOTE — Telephone Encounter (Signed)
Patient notified of medication change.

## 2016-07-30 DIAGNOSIS — J449 Chronic obstructive pulmonary disease, unspecified: Secondary | ICD-10-CM | POA: Diagnosis not present

## 2016-07-30 DIAGNOSIS — I509 Heart failure, unspecified: Secondary | ICD-10-CM | POA: Diagnosis not present

## 2016-07-30 DIAGNOSIS — E119 Type 2 diabetes mellitus without complications: Secondary | ICD-10-CM | POA: Diagnosis not present

## 2016-08-13 DIAGNOSIS — Z933 Colostomy status: Secondary | ICD-10-CM | POA: Diagnosis not present

## 2016-08-29 DIAGNOSIS — E119 Type 2 diabetes mellitus without complications: Secondary | ICD-10-CM | POA: Diagnosis not present

## 2016-08-29 DIAGNOSIS — I509 Heart failure, unspecified: Secondary | ICD-10-CM | POA: Diagnosis not present

## 2016-08-29 DIAGNOSIS — J449 Chronic obstructive pulmonary disease, unspecified: Secondary | ICD-10-CM | POA: Diagnosis not present

## 2016-09-16 DIAGNOSIS — E1129 Type 2 diabetes mellitus with other diabetic kidney complication: Secondary | ICD-10-CM | POA: Diagnosis not present

## 2016-09-16 DIAGNOSIS — L84 Corns and callosities: Secondary | ICD-10-CM | POA: Diagnosis not present

## 2016-09-16 DIAGNOSIS — R201 Hypoesthesia of skin: Secondary | ICD-10-CM | POA: Diagnosis not present

## 2016-09-16 DIAGNOSIS — N184 Chronic kidney disease, stage 4 (severe): Secondary | ICD-10-CM | POA: Diagnosis not present

## 2016-09-21 ENCOUNTER — Encounter: Payer: Self-pay | Admitting: Gastroenterology

## 2016-09-29 DIAGNOSIS — E119 Type 2 diabetes mellitus without complications: Secondary | ICD-10-CM | POA: Diagnosis not present

## 2016-09-29 DIAGNOSIS — I509 Heart failure, unspecified: Secondary | ICD-10-CM | POA: Diagnosis not present

## 2016-09-29 DIAGNOSIS — J449 Chronic obstructive pulmonary disease, unspecified: Secondary | ICD-10-CM | POA: Diagnosis not present

## 2016-10-06 ENCOUNTER — Ambulatory Visit: Payer: Medicare Other | Admitting: Nurse Practitioner

## 2016-10-11 ENCOUNTER — Ambulatory Visit: Payer: Medicare Other | Admitting: Cardiology

## 2016-10-11 ENCOUNTER — Encounter: Payer: Self-pay | Admitting: Cardiology

## 2016-10-11 NOTE — Progress Notes (Deleted)
Clinical Summary [name] [name] is a 75 y.o.female today for follow up of the following medical problems.   1. Nonobstructive CAD - non-obstructive CAD from cath in 2003 - metoprolol and lisinpril stopped in the past after admission with dehdyration and hypotension - denies any chest. No SOB or DOE - compliant with meds. No statin it appeasr due to prior muscle aches.   2. Hx of CVA - on plavix for secondary prevention  3. HTN - started on norvasc 5mg  daily due to elevated bp's at home   Past Medical History:  Diagnosis Date  . Anxiety   . Cancer ([place])   . Depression   . Diabetes mellitus without complication ([place])   . GERD (gastroesophageal reflux disease)   . Hyperlipidemia   . Hypertension   . Myocardial infarction   . Stroke [facility])    times 2     Allergies  Allergen Reactions  . Ciprofloxacin Anaphylaxis  . Aspirin Nausea And Vomiting  . Penicillins Nausea And Vomiting  . Propoxyphene N-Acetaminophen Nausea And Vomiting  . Sulfamethoxazole Rash     Current Outpatient Prescriptions  Medication Sig Dispense Refill  . acetaminophen (TYLENOL) 500 MG tablet Take 1 tablet (500 mg total) by mouth every 6 (six) hours as needed for mild pain.    Marland Kitchen albuterol (PROVENTIL HFA;VENTOLIN HFA) 108 (90 BASE) MCG/ACT inhaler Inhale 2 puffs into the lungs every 6 (six) hours as needed for wheezing or shortness of breath.     . ALPRAZolam (XANAX) 0.5 MG tablet Take 1 tablet (0.5 mg total) by mouth 4 (four) times daily as needed for anxiety or sleep. 10 tablet 0  . amLODipine (NORVASC) 5 MG tablet Take 1 tablet (5 mg total) by mouth daily. 90 tablet 3  . clopidogrel (PLAVIX) 75 MG tablet Take 1 tablet (75 mg total) by mouth daily. 30 tablet 6  . escitalopram (LEXAPRO) 20 MG tablet Take 20 mg by mouth daily.    Marland Kitchen ezetimibe (ZETIA) 10 MG tablet Take 10 mg by mouth daily.    . feeding supplement, RESOURCE BREEZE, (RESOURCE BREEZE) LIQD Take 1 Container by mouth 3 (three) times  daily between meals.  0  . HYDROcodone-acetaminophen (NORCO) 10-325 MG per tablet Take 1 tablet by mouth every 6 (six) hours as needed for moderate pain. (Patient taking differently: Take 0.5-1 tablets by mouth every 6 (six) hours as needed for moderate pain. ) 10 tablet 0  . lidocaine (XYLOCAINE) 2 % solution Use as directed 15 mLs in the mouth or throat as needed for mouth pain (to be used for up to 10 days starting on 10/12/2014).    Marland Kitchen NITROSTAT 0.4 MG SL tablet Place 0.4 mg under the tongue every 5 (five) minutes as needed for chest pain.     . pantoprazole (PROTONIX) 40 MG tablet Take 40 mg by mouth daily.    Marland Kitchen zolpidem (AMBIEN) 5 MG tablet Take 1 tablet (5 mg total) by mouth at bedtime. (Patient taking differently: Take 10 mg by mouth at bedtime. )     No current facility-administered medications for this visit.      Past Surgical History:  Procedure Laterality Date  . ABDOMINAL HYSTERECTOMY    . CATARACT EXTRACTION W/PHACO Right 03/20/2013   Procedure: CATARACT EXTRACTION PHACO AND INTRAOCULAR LENS PLACEMENT (IOC);  Surgeon: Elta Guadeloupe T. Gershon Crane, MD;  Location: [facility];  Service: Ophthalmology;  Laterality: Right;  CDE:  6.29   . CHOLECYSTECTOMY    . COLOSTOMY    .  OOPHORECTOMY    . TONSILLECTOMY       Allergies  Allergen Reactions  . Ciprofloxacin Anaphylaxis  . Aspirin Nausea And Vomiting  . Penicillins Nausea And Vomiting  . Propoxyphene N-Acetaminophen Nausea And Vomiting  . Sulfamethoxazole Rash      Family History  Problem Relation Age of Onset  . Cancer Other   . Diabetes Other   . Abnormal EKG Other   . Arthritis Other   . Asthma Other   . Kidney disease Other      Social History Ms. Mahaney reports that she quit smoking about 44 years ago. Her smoking use included Cigarettes. She started smoking about 59 years ago. She has a 3.75 pack-year smoking history. Her smokeless tobacco use includes Snuff. Ms. Tlatelpa reports that she does not drink  alcohol.   Review of Systems CONSTITUTIONAL: No weight loss, fever, chills, weakness or fatigue.  HEENT: Eyes: No visual loss, blurred vision, double vision or yellow sclerae.No hearing loss, sneezing, congestion, runny nose or sore throat.  SKIN: No rash or itching.  CARDIOVASCULAR:  RESPIRATORY: No shortness of breath, cough or sputum.  GASTROINTESTINAL: No anorexia, nausea, vomiting or diarrhea. No abdominal pain or blood.  GENITOURINARY: No burning on urination, no polyuria NEUROLOGICAL: No headache, dizziness, syncope, paralysis, ataxia, numbness or tingling in the extremities. No change in bowel or bladder control.  MUSCULOSKELETAL: No muscle, back pain, joint pain or stiffness.  LYMPHATICS: No enlarged nodes. No history of splenectomy.  PSYCHIATRIC: No history of depression or anxiety.  ENDOCRINOLOGIC: No reports of sweating, cold or heat intolerance. No polyuria or polydipsia.  Marland Kitchen   Physical Examination There were no vitals filed for this visit. There were no vitals filed for this visit.  Gen: resting comfortably, no acute distress HEENT: no scleral icterus, pupils equal round and reactive, no palptable cervical adenopathy,  CV Resp: Clear to auscultation bilaterally GI: abdomen is soft, non-tender, non-distended, normal bowel sounds, no hepatosplenomegaly MSK: extremities are warm, no edema.  Skin: warm, no rash Neuro:  no focal deficits Psych: appropriate affect   Diagnostic Studies Jan 2014 Echo Study Conclusions  - Left ventricle: The cavity size was normal. There was mild concentric hypertrophy. Systolic function was vigorous. The estimated ejection fraction was in the range of 65% to 70%. Wall motion was normal; there were no regional wall motion abnormalities. - Aortic valve: Mildly to moderately calcified annulus. Mildly thickened, mildly calcified leaflets. There wastrivial, if any,stenosis. - Mitral valve: Calcified annulus. - Left atrium:  The atrium was mildly dilated. - Right ventricle: The cavity size was normal. Wall thickness was moderately increased. - Atrial septum: No defect or patent foramen ovale was identified. Impressions:  - Compared to the prior study performed 08/11/11, there has been no significant interval change.  12/2013 Carotid US IMPRESSION: 1. Mild heterogeneous plaque bilaterally without evidence of stenosis or interval progression compared to 02/26/2010. 2. Vertebral arteries are patent with normal antegrade flow.   06/2002 cath PRESSURES: 1. LV: 150/0; LVEDP 18 mmHg. 2. CA: 150/80 mmHg. 3. There was no gradient across the aortic valve on catheter pullback.  ANGIOGRAPHY: Fluoroscopy did not reveal any significant coronary, intracardiac, or valvular calcification. There was a prosthetic ring in place in the area of the stomach and distal esophagus, presumably from the patient's prior hiatal hernia surgery he had done at [facility] several years ago. 1. The main left coronary was normal. 2. The left anterior descending artery had minor irregularity with less than  20-30% minimal  narrowing beyond the first diagonal Braylie Badami. The distal  third of the LAD was mildly irregular but no significant stenosis or  atherosclerotic lesions with good flow to the apex where it bifurcated. 3. The first diagonal Masiel Gentzler arose before SP-1 and 2 bifurcated and it was  normal. 4. The circumflex was nondominant but moderate sized. There was a small OM-  1 that was normal and moderately large OM-2 that bifurcated and was  normal, a moderate sized OM-3 that was normal. Beyond this, there was a  40-50% narrowing at the PABG Raequan Vanschaick which was small and bifurcated. 5. The right coronary was a dominant vessel. There was dilatation a  centimeter after the proximal portion of the ostia. There was no ostial  or proximal stenosis and the dilatation was mild without  aneurysm  formation. The remainder of the vessel was widely patent and smooth with  a small but normal PDA and PLA and normal RV branches. 6. LV angiogram in the RAO and LAO projection showed a normally contracting  ventricle which was vigorous, EF approximately 60% or greater with  angiographic LVH. There was no mitral regurgitation. 7. Abdominal aortic angiogram in the midstream PA projection showed a normal  SMA and celiac access, normal single right renal artery, and dual normal  left renal arteries. The infrarenal abdominal aorta was widely patent  with no stenosis or aneurysm formation and no significant irregularity.  The common iliacs were normal. The proximal external iliacs were normal,  mildly tortuous, and the hypogastrics were intact with good runoff  bilaterally.  DISCUSSION: This 75 year old patient of Dr. Caron Presume has a remote history of catheterization without significant coronary disease over a decade ago at [facility] but these records are not available. She has morbid obesity, a history of diabetes, past CVAs, nonsmoker with COPD and asthma, remote cholecystectomy, and remote hiatal hernia repair presumably for hiatal hernia and GI bleeding several years ago at [place]. She is admitted now with chest pain felt to be compatible with ischemia. Diagnostic catheterization shows no significant coronary artery disease. There is mild dilatation in the proximal third of the RCA but no aneurysm formation and no significant stenosis. There is less than 50% lesion of the distal circumflex, good normal LV function and mild hypertension.  RECOMMENDATIONS: I would recommend medical therapy. She will probably need GI evaluation with her history of chest pain and past GI disease.  CATHETERIZATION DIAGNOSES: 1. Chest pain etiology not determined. 2. Possible gastroesophageal reflux disease and/or esophageal spasm. 3. Remote  hiatal hernia repair, [place] [facility]. 4. Remote cholecystectomy. 5. Severe morbid obesity. 6. Osteoarthritis. 7. Bilateral cataracts. 8. Adult onset diabetes mellitus. 9. Prior cerebrovascular accidents. 10. Remote hysterectomy. 11. History of palpitations.      Assessment and Plan  1. CAD -nonobstructive CAD by prior cath - EKG in clinic SR without ischemic changes - no recent chest pain symptoms - we will continue current meds.     2. Hyperlipidemia - intolerant to statins, on zetia. We will request pcp labs   F/u 6 months      Arnoldo Lenis, M.D., F.A.C.C.

## 2016-10-20 ENCOUNTER — Encounter: Payer: Self-pay | Admitting: Cardiology

## 2016-10-20 ENCOUNTER — Ambulatory Visit (INDEPENDENT_AMBULATORY_CARE_PROVIDER_SITE_OTHER): Payer: Medicare Other | Admitting: Cardiology

## 2016-10-20 VITALS — BP 114/61 | HR 73 | Ht 60.0 in | Wt 187.2 lb

## 2016-10-20 DIAGNOSIS — I2510 Atherosclerotic heart disease of native coronary artery without angina pectoris: Secondary | ICD-10-CM

## 2016-10-20 DIAGNOSIS — I10 Essential (primary) hypertension: Secondary | ICD-10-CM | POA: Diagnosis not present

## 2016-10-20 DIAGNOSIS — E782 Mixed hyperlipidemia: Secondary | ICD-10-CM | POA: Diagnosis not present

## 2016-10-20 NOTE — Patient Instructions (Signed)

## 2016-10-20 NOTE — Progress Notes (Signed)
Clinical Summary Ms. Jentzsch is a 75 y.o.female  seen today for follow up of the following medical problems.   1. Nonobstructive CAD - non-obstructive CAD from cath in 2003 - metoprolol and lisinpril stopped in the past after admission with dehdyration and hypotension    - dull pain, starts in neck down into both shoulders and into chest. She reports chronic neck stiffness. Previous CT cervical spine showed severe OA.   2. Hx of CVA - on plavix for secondary prevention  3. HTN - recently started on norvasc. - she reports pcp stopped lopressor in the past   4. HL - she is on zetia - reports recent labs with pcp  5. CKD - followed by Dr Hinda Lenis   Past Medical History:  Diagnosis Date  . Anxiety   . Cancer ([place])   . Depression   . Diabetes mellitus without complication ([place])   . GERD (gastroesophageal reflux disease)   . Hyperlipidemia   . Hypertension   . Myocardial infarction   . Stroke [facility])    times 2     Allergies  Allergen Reactions  . Ciprofloxacin Anaphylaxis  . Aspirin Nausea And Vomiting  . Penicillins Nausea And Vomiting  . Propoxyphene N-Acetaminophen Nausea And Vomiting  . Sulfamethoxazole Rash     Current Outpatient Prescriptions  Medication Sig Dispense Refill  . acetaminophen (TYLENOL) 500 MG tablet Take 1 tablet (500 mg total) by mouth every 6 (six) hours as needed for mild pain.    Marland Kitchen albuterol (PROVENTIL HFA;VENTOLIN HFA) 108 (90 BASE) MCG/ACT inhaler Inhale 2 puffs into the lungs every 6 (six) hours as needed for wheezing or shortness of breath.     . ALPRAZolam (XANAX) 0.5 MG tablet Take 1 tablet (0.5 mg total) by mouth 4 (four) times daily as needed for anxiety or sleep. 10 tablet 0  . amLODipine (NORVASC) 5 MG tablet Take 1 tablet (5 mg total) by mouth daily. 90 tablet 3  . clopidogrel (PLAVIX) 75 MG tablet Take 1 tablet (75 mg total) by mouth daily. 30 tablet 6  . escitalopram (LEXAPRO) 20 MG tablet Take 20 mg by mouth daily.      Marland Kitchen ezetimibe (ZETIA) 10 MG tablet Take 10 mg by mouth daily.    . feeding supplement, RESOURCE BREEZE, (RESOURCE BREEZE) LIQD Take 1 Container by mouth 3 (three) times daily between meals.  0  . HYDROcodone-acetaminophen (NORCO) 10-325 MG per tablet Take 1 tablet by mouth every 6 (six) hours as needed for moderate pain. (Patient taking differently: Take 0.5-1 tablets by mouth every 6 (six) hours as needed for moderate pain. ) 10 tablet 0  . lidocaine (XYLOCAINE) 2 % solution Use as directed 15 mLs in the mouth or throat as needed for mouth pain (to be used for up to 10 days starting on 10/12/2014).    Marland Kitchen NITROSTAT 0.4 MG SL tablet Place 0.4 mg under the tongue every 5 (five) minutes as needed for chest pain.     . pantoprazole (PROTONIX) 40 MG tablet Take 40 mg by mouth daily.    Marland Kitchen zolpidem (AMBIEN) 5 MG tablet Take 1 tablet (5 mg total) by mouth at bedtime. (Patient taking differently: Take 10 mg by mouth at bedtime. )     No current facility-administered medications for this visit.      Past Surgical History:  Procedure Laterality Date  . ABDOMINAL HYSTERECTOMY    . CATARACT EXTRACTION W/PHACO Right 03/20/2013   Procedure: CATARACT EXTRACTION PHACO AND  INTRAOCULAR LENS PLACEMENT (IOC);  Surgeon: Elta Guadeloupe T. Gershon Crane, MD;  Location: [facility];  Service: Ophthalmology;  Laterality: Right;  CDE:  6.29   . CHOLECYSTECTOMY    . COLOSTOMY    . OOPHORECTOMY    . TONSILLECTOMY       Allergies  Allergen Reactions  . Ciprofloxacin Anaphylaxis  . Aspirin Nausea And Vomiting  . Penicillins Nausea And Vomiting  . Propoxyphene N-Acetaminophen Nausea And Vomiting  . Sulfamethoxazole Rash      Family History  Problem Relation Age of Onset  . Cancer Other   . Diabetes Other   . Abnormal EKG Other   . Arthritis Other   . Asthma Other   . Kidney disease Other      Social History Ms. Lotter reports that she quit smoking about 44 years ago. Her smoking use included Cigarettes. She started  smoking about 59 years ago. She has a 3.75 pack-year smoking history. Her smokeless tobacco use includes Snuff. Ms. Wyche reports that she does not drink alcohol.   Review of Systems CONSTITUTIONAL: No weight loss, fever, chills, weakness or fatigue.  HEENT: Eyes: No visual loss, blurred vision, double vision or yellow sclerae.No hearing loss, sneezing, congestion, runny nose or sore throat.  SKIN: No rash or itching.  CARDIOVASCULAR: per hpi RESPIRATORY: No shortness of breath, cough or sputum.  GASTROINTESTINAL: No anorexia, nausea, vomiting or diarrhea. No abdominal pain or blood.  GENITOURINARY: No burning on urination, no polyuria NEUROLOGICAL: No headache, dizziness, syncope, paralysis, ataxia, numbness or tingling in the extremities. No change in bowel or bladder control.  MUSCULOSKELETAL: No muscle, back pain, joint pain or stiffness.  LYMPHATICS: No enlarged nodes. No history of splenectomy.  PSYCHIATRIC: No history of depression or anxiety.  ENDOCRINOLOGIC: No reports of sweating, cold or heat intolerance. No polyuria or polydipsia.  Marland Kitchen   Physical Examination Vitals:   10/20/16 1348  BP: 114/61  Pulse: 73   Vitals:   10/20/16 1348  Weight: 187 lb 3.2 oz (84.9 kg)  Height: 5' (1.524 m)    Gen: resting comfortably, no acute distress HEENT: no scleral icterus, pupils equal round and reactive, no palptable cervical adenopathy,  CV: RRR, no mrg, no jvd Resp: Clear to auscultation bilaterally GI: abdomen is soft, non-tender, non-distended, normal bowel sounds, no hepatosplenomegaly MSK: extremities are warm, no edema.  Skin: warm, no rash Neuro:  no focal deficits Psych: appropriate affect   Diagnostic Studies Jan 2014 Echo Study Conclusions  - Left ventricle: The cavity size was normal. There was mild concentric hypertrophy. Systolic function was vigorous. The estimated ejection fraction was in the range of 65% to 70%. Wall motion was normal; there were  no regional wall motion abnormalities. - Aortic valve: Mildly to moderately calcified annulus. Mildly thickened, mildly calcified leaflets. There wastrivial, if any,stenosis. - Mitral valve: Calcified annulus. - Left atrium: The atrium was mildly dilated. - Right ventricle: The cavity size was normal. Wall thickness was moderately increased. - Atrial septum: No defect or patent foramen ovale was identified. Impressions:  - Compared to the prior study performed 08/11/11, there has been no significant interval change.  12/2013 Carotid US IMPRESSION: 1. Mild heterogeneous plaque bilaterally without evidence of stenosis or interval progression compared to 02/26/2010. 2. Vertebral arteries are patent with normal antegrade flow.   06/2002 cath PRESSURES: 1. LV: 150/0; LVEDP 18 mmHg. 2. CA: 150/80 mmHg. 3. There was no gradient across the aortic valve on catheter pullback.  ANGIOGRAPHY: Fluoroscopy did not reveal any significant  coronary, intracardiac, or valvular calcification. There was a prosthetic ring in place in the area of the stomach and distal esophagus, presumably from the patient's prior hiatal hernia surgery he had done at [facility] several years ago. 1. The main left coronary was normal. 2. The left anterior descending artery had minor irregularity with less than  20-30% minimal narrowing beyond the first diagonal Elihu Milstein. The distal  third of the LAD was mildly irregular but no significant stenosis or  atherosclerotic lesions with good flow to the apex where it bifurcated. 3. The first diagonal Lavere Shinsky arose before SP-1 and 2 bifurcated and it was  normal. 4. The circumflex was nondominant but moderate sized. There was a small OM-  1 that was normal and moderately large OM-2 that bifurcated and was  normal, a moderate sized OM-3 that was normal. Beyond this, there was a  40-50% narrowing at the PABG Diontay Rosencrans which was  small and bifurcated. 5. The right coronary was a dominant vessel. There was dilatation a  centimeter after the proximal portion of the ostia. There was no ostial  or proximal stenosis and the dilatation was mild without aneurysm  formation. The remainder of the vessel was widely patent and smooth with  a small but normal PDA and PLA and normal RV branches. 6. LV angiogram in the RAO and LAO projection showed a normally contracting  ventricle which was vigorous, EF approximately 60% or greater with  angiographic LVH. There was no mitral regurgitation. 7. Abdominal aortic angiogram in the midstream PA projection showed a normal  SMA and celiac access, normal single right renal artery, and dual normal  left renal arteries. The infrarenal abdominal aorta was widely patent  with no stenosis or aneurysm formation and no significant irregularity.  The common iliacs were normal. The proximal external iliacs were normal,  mildly tortuous, and the hypogastrics were intact with good runoff  bilaterally.  DISCUSSION: This 75 year old patient of Dr. Caron Presume has a remote history of catheterization without significant coronary disease over a decade ago at [facility] but these records are not available. She has morbid obesity, a history of diabetes, past CVAs, nonsmoker with COPD and asthma, remote cholecystectomy, and remote hiatal hernia repair presumably for hiatal hernia and GI bleeding several years ago at [place]. She is admitted now with chest pain felt to be compatible with ischemia. Diagnostic catheterization shows no significant coronary artery disease. There is mild dilatation in the proximal third of the RCA but no aneurysm formation and no significant stenosis. There is less than 50% lesion of the distal circumflex, good normal LV function and mild hypertension.  RECOMMENDATIONS: I would recommend medical therapy. She will  probably need GI evaluation with her history of chest pain and past GI disease.  CATHETERIZATION DIAGNOSES: 1. Chest pain etiology not determined. 2. Possible gastroesophageal reflux disease and/or esophageal spasm. 3. Remote hiatal hernia repair, [place]. 4. Remote cholecystectomy. 5. Severe morbid obesity. 6. Osteoarthritis. 7. Bilateral cataracts. 8. Adult onset diabetes mellitus. 9. Prior cerebrovascular accidents. 10. Remote hysterectomy. 11. History of palpitations.    Assessment and Plan  1. CAD -nonobstructive CAD by prior cath -no recent symptoms - we will continue current meds.   2. Hyperlipidemia - intolerant to statins, on zetia.  - request labs from pcp  3. HTN - at goal, continue current meds  F/u 6 months       Arnoldo Lenis, M.D.

## 2016-10-29 DIAGNOSIS — E119 Type 2 diabetes mellitus without complications: Secondary | ICD-10-CM | POA: Diagnosis not present

## 2016-10-29 DIAGNOSIS — I509 Heart failure, unspecified: Secondary | ICD-10-CM | POA: Diagnosis not present

## 2016-10-29 DIAGNOSIS — J449 Chronic obstructive pulmonary disease, unspecified: Secondary | ICD-10-CM | POA: Diagnosis not present

## 2016-11-09 DIAGNOSIS — C801 Malignant (primary) neoplasm, unspecified: Secondary | ICD-10-CM | POA: Diagnosis not present

## 2016-11-09 DIAGNOSIS — C060 Malignant neoplasm of cheek mucosa: Secondary | ICD-10-CM | POA: Diagnosis not present

## 2016-11-09 DIAGNOSIS — K1321 Leukoplakia of oral mucosa, including tongue: Secondary | ICD-10-CM | POA: Diagnosis not present

## 2016-11-09 DIAGNOSIS — Z720 Tobacco use: Secondary | ICD-10-CM | POA: Diagnosis not present

## 2016-11-10 DIAGNOSIS — D509 Iron deficiency anemia, unspecified: Secondary | ICD-10-CM | POA: Diagnosis not present

## 2016-11-10 DIAGNOSIS — Z79899 Other long term (current) drug therapy: Secondary | ICD-10-CM | POA: Diagnosis not present

## 2016-11-10 DIAGNOSIS — I10 Essential (primary) hypertension: Secondary | ICD-10-CM | POA: Diagnosis not present

## 2016-11-10 DIAGNOSIS — E559 Vitamin D deficiency, unspecified: Secondary | ICD-10-CM | POA: Diagnosis not present

## 2016-11-10 DIAGNOSIS — N183 Chronic kidney disease, stage 3 (moderate): Secondary | ICD-10-CM | POA: Diagnosis not present

## 2016-11-22 DIAGNOSIS — N183 Chronic kidney disease, stage 3 (moderate): Secondary | ICD-10-CM | POA: Diagnosis not present

## 2016-11-22 DIAGNOSIS — I10 Essential (primary) hypertension: Secondary | ICD-10-CM | POA: Diagnosis not present

## 2016-11-22 DIAGNOSIS — E559 Vitamin D deficiency, unspecified: Secondary | ICD-10-CM | POA: Diagnosis not present

## 2016-11-22 DIAGNOSIS — N2581 Secondary hyperparathyroidism of renal origin: Secondary | ICD-10-CM | POA: Diagnosis not present

## 2016-11-29 DIAGNOSIS — I509 Heart failure, unspecified: Secondary | ICD-10-CM | POA: Diagnosis not present

## 2016-11-29 DIAGNOSIS — J449 Chronic obstructive pulmonary disease, unspecified: Secondary | ICD-10-CM | POA: Diagnosis not present

## 2016-11-29 DIAGNOSIS — E119 Type 2 diabetes mellitus without complications: Secondary | ICD-10-CM | POA: Diagnosis not present

## 2016-11-29 DIAGNOSIS — Z933 Colostomy status: Secondary | ICD-10-CM | POA: Diagnosis not present

## 2016-12-08 DIAGNOSIS — G894 Chronic pain syndrome: Secondary | ICD-10-CM | POA: Diagnosis not present

## 2016-12-30 DIAGNOSIS — E119 Type 2 diabetes mellitus without complications: Secondary | ICD-10-CM | POA: Diagnosis not present

## 2016-12-30 DIAGNOSIS — J449 Chronic obstructive pulmonary disease, unspecified: Secondary | ICD-10-CM | POA: Diagnosis not present

## 2016-12-30 DIAGNOSIS — I509 Heart failure, unspecified: Secondary | ICD-10-CM | POA: Diagnosis not present

## 2016-12-30 DIAGNOSIS — K5792 Diverticulitis of intestine, part unspecified, without perforation or abscess without bleeding: Secondary | ICD-10-CM | POA: Diagnosis not present

## 2016-12-31 ENCOUNTER — Encounter: Payer: Self-pay | Admitting: Hematology and Oncology

## 2016-12-31 ENCOUNTER — Telehealth: Payer: Self-pay | Admitting: Hematology and Oncology

## 2016-12-31 NOTE — Telephone Encounter (Signed)
Appt has been scheduled for the pt to see Dr. Alvy Bimler on 3/2 at 2pm. Pt aware to arrive 30 minutes early. Demographics verified. Letter mailed.

## 2017-01-03 DIAGNOSIS — E1129 Type 2 diabetes mellitus with other diabetic kidney complication: Secondary | ICD-10-CM | POA: Diagnosis not present

## 2017-01-07 ENCOUNTER — Encounter: Payer: Self-pay | Admitting: Hematology and Oncology

## 2017-01-07 ENCOUNTER — Other Ambulatory Visit: Payer: Self-pay | Admitting: Hematology and Oncology

## 2017-01-07 ENCOUNTER — Ambulatory Visit (HOSPITAL_BASED_OUTPATIENT_CLINIC_OR_DEPARTMENT_OTHER): Payer: Medicare Other | Admitting: Hematology and Oncology

## 2017-01-07 ENCOUNTER — Encounter: Payer: Self-pay | Admitting: *Deleted

## 2017-01-07 VITALS — BP 130/60 | HR 85 | Temp 97.9°F | Resp 18 | Ht 60.0 in | Wt 188.7 lb

## 2017-01-07 DIAGNOSIS — C060 Malignant neoplasm of cheek mucosa: Secondary | ICD-10-CM | POA: Diagnosis not present

## 2017-01-07 DIAGNOSIS — Z720 Tobacco use: Secondary | ICD-10-CM | POA: Diagnosis not present

## 2017-01-07 DIAGNOSIS — K1321 Leukoplakia of oral mucosa, including tongue: Secondary | ICD-10-CM

## 2017-01-07 DIAGNOSIS — N184 Chronic kidney disease, stage 4 (severe): Secondary | ICD-10-CM | POA: Diagnosis not present

## 2017-01-07 DIAGNOSIS — Z8673 Personal history of transient ischemic attack (TIA), and cerebral infarction without residual deficits: Secondary | ICD-10-CM | POA: Diagnosis not present

## 2017-01-07 DIAGNOSIS — C069 Malignant neoplasm of mouth, unspecified: Secondary | ICD-10-CM

## 2017-01-07 DIAGNOSIS — Z789 Other specified health status: Secondary | ICD-10-CM

## 2017-01-07 NOTE — Assessment & Plan Note (Signed)
I explained to the patient that the cancer is due to her tobacco dipping habit. The patient does not appear to be interested to quit smoking.  Having said that, she has tried to cut down the consumption significantly over the past few months

## 2017-01-07 NOTE — Progress Notes (Signed)
Oncology Nurse Navigator Documentation  Met with Ms. Bradt during initial consult with Dr. Gorsuch.  She was accompanied by her son. 1. Introduced myself as her/their Navigator, explained my role as a member of the Care Team. 2. Provided New Patient Information packet:  Contact information for physician, this navigator, other members of the Care Team  Advance Directive information ([facility] blue pamphlet with LCSW insert).  Provided copy of [facility] ADL Document.  Fall Prevention Patient Safety Plan  Appointment Guideline  Financial Assistance Information sheet  [facility] campus map with highlight of [facility]. 3. They verbalized understanding of information provided. I encouraged them to call with questions/concerns, they verbalized understanding.  Rick Diehl, RN, BSN, CHPN Head & Neck Oncology Nurse Navigator [facility] [phone]  

## 2017-01-07 NOTE — Assessment & Plan Note (Signed)
The patient have recurrent acute on chronic renal failure and baseline chronic kidney disease stage IV. Common chemotherapy for invasive squamous cell carcinoma are cisplatin based treatment and the patient is not a candidate for this. I explained to the patient the rationale behind not prescribing chemotherapy right now and only resuming chemotherapy for palliative option.

## 2017-01-07 NOTE — Assessment & Plan Note (Signed)
She has multiple cardiovascular risk factors. She will continue Plavix and medical management

## 2017-01-07 NOTE — Progress Notes (Signed)
[place] CONSULT NOTE  Patient Care Team: Redmond School, MD as PCP - General (Internal Medicine) Renella Cunas, MD (Inactive) (Cardiology) Danie Binder, MD as Consulting Physician (Gastroenterology)  CHIEF COMPLAINTS/PURPOSE OF CONSULTATION:  Recurrent, invasive squamous cell carcinoma of the lip and buccal mucosa  She is here today accompanied by her son   HISTORY OF PRESENTING ILLNESS:  [name] [name] 76 y.o. female is here, referred by her surgeon at [facility] due to persistent and recurrent invasive squamous cell carcinoma. According to the patient, the first initial presentation was due to chronic leukoplakia  The patient is a long-term tobacco dipper user./dysplasia. I reviewed her records extensively and outside records and summarized as follows    Squamous cell cancer of buccal mucosa ([place])   09/06/2002 Pathology Results    1. ORAL MUCOSA, BIOPSY, ANTERIOR BUCCAL: SQUAMOUS DYSPLASIA, SEE DESCRIPTION  2. ORAL MUCOSA, BIOPSY, POSTERIOR BUCCAL: SQUAMOUS DYSPLASIA, SEE DESCRIPTION      09/29/2012 Pathology Results    ACCESSION NUMBER: [id number] RECEIVED: 10/02/2012 ORDERING PHYSICIAN:JOSHUA Tommas Olp , MD PATIENT NAME:[name] [name] [facility] SURGICAL PATHOLOGY REPORT  FINAL PATHOLOGIC DIAGNOSIS MICROSCOPIC EXAMINATION AND DIAGNOSIS  CONSULT SLIDES, OUTSIDE CASE [phone]  SOFT TISSUE, LEFT BUCCAL MUCOSA, BIOPSY (1): Verruca vulgaris, inflamed.  SOFT TISSUE, RIGHT GINGIVAL SULCUS, BIOPSY (2): Verruca vulgaris, inflamed.      05/22/2014 Pathology Results    SKIN, LEFT LIP, BIOPSY: Verruca vulgaris and a surface of an epidermal cyst.      10/11/2014 Pathology Results    ACCESSION [id number] RECEIVED: 10/11/2014 ORDERING PHYSICIAN:JOSHUA DOUGLASS [name] [name] , MD PATIENT NAME:Cedotal, [name] [name] SURGICAL PATHOLOGY REPORT  FINAL PATHOLOGIC DIAGNOSIS MICROSCOPIC EXAMINATION AND  DIAGNOSIS  A.MIDLINE LOWER LIP LESION, EXCISION: Verrucous carcinoma, extending to all the margins.  B.LEFT UPPER GINGIVA LESION, EXCISION: Invasive squamous cell carcinoma, extending to all the margins.  C.LEFT BUCCAL LESION, EXCISION: Invasive squamous cell carcinoma, extending to all the margins.      10/14/2014 Surgery    At [facility], she underwent a resection of the left labial, buccal, hard palate and gingival lesion on 10/14/14. Pathology showed invasive squamous cell carcinoma. She was scheduled to undergo additional resection on the right in March 2016 but cancelled the surgery.           11/21/2014 Imaging    1. Limited examination secondary to lack of intravenous contrast without mass lesion identified. 2. Asymmetric diffuse irregularity of the left maxillary alveolar surface without associated soft tissue mass. Direct inspection is recommended. 3. Soft tissue density contiguous with the posterior margin of the proximal cervical esophagus as detailed above may reflect a Zenker's diverticulum. Appearance is not typical for neoplastic disease. This could be verified with barium swallow if clinically indicated.       she denies any hearing deficit, swallowing difficulties, painful swallowing, oral bleeding, changes in the quality of her voice or abnormal weight loss. She has chronic difficulties with chewing her food.  The patient has no teeth. She can tolerate soft diet The patient have remote history of myocardial infarction and stroke.  She had no residual permanent neurological deficit. She takes occasional nitroglycerin for atypical chest pain. At baseline, she lives a very sedentary lifestyle.  She relies on multiple family members for all activities of daily living  MEDICAL HISTORY:  Past Medical History:  Diagnosis Date  . Anxiety   . Cancer ([place])   . Depression   . Diabetes mellitus without complication ([place])   . GERD (gastroesophageal  reflux  disease)   . Hyperlipidemia   . Hypertension   . Myocardial infarction   . Stroke [facility])    times 2    SURGICAL HISTORY: Past Surgical History:  Procedure Laterality Date  . ABDOMINAL HYSTERECTOMY    . CATARACT EXTRACTION W/PHACO Right 03/20/2013   Procedure: CATARACT EXTRACTION PHACO AND INTRAOCULAR LENS PLACEMENT (IOC);  Surgeon: Elta Guadeloupe T. Gershon Crane, MD;  Location: [facility];  Service: Ophthalmology;  Laterality: Right;  CDE:  6.29   . CHOLECYSTECTOMY    . COLOSTOMY    . OOPHORECTOMY    . TONSILLECTOMY      SOCIAL HISTORY: Social History   Social History  . Marital status: Widowed    Spouse name: N/A  . Number of children: N/A  . Years of education: N/A   Occupational History  . disabled    Social History Main Topics  . Smoking status: Former Smoker    Packs/day: 0.25    Years: 15.00    Types: Cigarettes    Start date: 12/12/1956    Quit date: 08/18/1972  . Smokeless tobacco: Current User    Types: Snuff     Comment: 1-2 cigs daily x 6 months  . Alcohol use No  . Drug use: No  . Sexual activity: Yes    Birth control/ protection: Surgical   Other Topics Concern  . Not on file   Social History Narrative  . No narrative on file    FAMILY HISTORY: Family History  Problem Relation Age of Onset  . Cancer Other   . Diabetes Other   . Abnormal EKG Other   . Arthritis Other   . Asthma Other   . Kidney disease Other     ALLERGIES:  is allergic to ciprofloxacin; aspirin; penicillins; propoxyphene n-acetaminophen; and sulfamethoxazole.  MEDICATIONS:  Current Outpatient Prescriptions  Medication Sig Dispense Refill  . acetaminophen (TYLENOL) 500 MG tablet Take 1 tablet (500 mg total) by mouth every 6 (six) hours as needed for mild pain.    Marland Kitchen ALPRAZolam (XANAX) 0.5 MG tablet Take 1 tablet (0.5 mg total) by mouth 4 (four) times daily as needed for anxiety or sleep. 10 tablet 0  . cholecalciferol (VITAMIN D) 1000 units tablet Take 1,000 Units by mouth daily.  2000 units daily    . clopidogrel (PLAVIX) 75 MG tablet Take 1 tablet (75 mg total) by mouth daily. 30 tablet 6  . feeding supplement, RESOURCE BREEZE, (RESOURCE BREEZE) LIQD Take 1 Container by mouth 3 (three) times daily between meals.  0  . HYDROcodone-acetaminophen (NORCO) 10-325 MG per tablet Take 1 tablet by mouth every 6 (six) hours as needed for moderate pain. (Patient taking differently: Take 0.5-1 tablets by mouth every 6 (six) hours as needed for moderate pain. ) 10 tablet 0  . amLODipine (NORVASC) 5 MG tablet Take 1 tablet (5 mg total) by mouth daily. 90 tablet 3  . escitalopram (LEXAPRO) 20 MG tablet Take 20 mg by mouth daily.    Marland Kitchen ezetimibe (ZETIA) 10 MG tablet Take 10 mg by mouth daily.    Marland Kitchen NITROSTAT 0.4 MG SL tablet Place 0.4 mg under the tongue every 5 (five) minutes as needed for chest pain.      No current facility-administered medications for this visit.     REVIEW OF SYSTEMS:   Constitutional: Denies fevers, chills or abnormal night sweats Eyes: Denies blurriness of vision, double vision or watery eyes Ears, nose, mouth, throat, and face: Denies mucositis or sore throat Respiratory: Denies cough,  dyspnea or wheezes Cardiovascular: Denies palpitation, chest discomfort or lower extremity swelling Gastrointestinal:  Denies nausea, heartburn or change in bowel habits Skin: Denies abnormal skin rashes Lymphatics: Denies new lymphadenopathy or easy bruising Neurological:Denies numbness, tingling or new weaknesses Behavioral/Psych: Mood is stable, no new changes  All other systems were reviewed with the patient and are negative.  PHYSICAL EXAMINATION: ECOG PERFORMANCE STATUS: 2 - Symptomatic, <50% confined to bed  Vitals:   01/07/17 1344  BP: 130/60  Pulse: 85  Resp: 18  Temp: 97.9 F (36.6 C)   Filed Weights   01/07/17 1344  Weight: 188 lb 11.2 oz (85.6 kg)    GENERAL:alert, no distress and comfortable.  She is obese and appears elderly SKIN: skin color,  texture, turgor are normal, no rashes or significant lesions EYES: normal, conjunctiva are pink and non-injected, sclera clear OROPHARYNX: Obvious cancer noted on her lips.  There is also abnormal changes in her gums.  With her permission, pictures of her mouth was taken NECK: supple, thyroid normal size, non-tender, without nodularity LYMPH:  no palpable lymphadenopathy in the cervical, axillary or inguinal LUNGS: clear to auscultation and percussion with normal breathing effort HEART: regular rate & rhythm and no murmurs and no lower extremity edema ABDOMEN:abdomen soft, non-tender and normal bowel sounds.  Colostomy in situ Musculoskeletal:no cyanosis of digits and no clubbing  PSYCH: alert & oriented x 3 with  NEURO: no focal motor/sensory deficits. Reduced speech fluency due to her cancer   LABORATORY DATA:  I have reviewed the data as listed Lab Results  Component Value Date   WBC 9.2 03/15/2015   HGB 9.0 (L) 03/15/2015   HCT 26.4 (L) 03/15/2015   MCV 86.3 03/15/2015   PLT 226 03/15/2015   Lab Results  Component Value Date   NA 141 03/16/2015   K 4.7 03/16/2015   CL 114 (H) 03/16/2015   CO2 23 03/16/2015      ASSESSMENT & PLAN:  Squamous cell cancer of buccal mucosa (HCC) This patient have chronic leukoplakia in the beginning and subsequently was diagnosed with invasive squamous cell carcinoma due to her chewing habit. She was offered surgery but the patient declined surgery. For some reason, she got the impression that chemotherapy may provide a cure without major side effects. The patient has significant comorbidities including history of stroke 3 and history of myocardial infarction. Although she does not have major neurological deficit and she does not have ongoing history of angina symptoms, the patient is very sedentary at home and rely on multiple family members for most activities of daily living.  She does not even take a shower. The patient have intermittent,  recurrent, acute on chronic renal failure. Her last serum creatinine and put her on stage IV chronic kidney disease She is not a good candidate for systemic chemotherapy.  At most, the role of chemotherapy would be strictly palliative. I reviewed with her and her son the current guidelines. Local therapy such as surgery or palliative radiation treatment are consider mainstay and first choice options. I will get her case presented at the next ENT tumor board. I will order a PET CT scan to complete her staging. We will get referral to radiation oncology. I will see her back as soon as PET CT scan is available for treatment recommendations  Chronic kidney disease (CKD), stage IV (severe) ([place]) The patient have recurrent acute on chronic renal failure and baseline chronic kidney disease stage IV. Common chemotherapy for invasive squamous cell carcinoma are  cisplatin based treatment and the patient is not a candidate for this. I explained to the patient the rationale behind not prescribing chemotherapy right now and only resuming chemotherapy for palliative option.  H/O: CVA (cerebrovascular accident) She has multiple cardiovascular risk factors. She will continue Plavix and medical management  Tobacco dipper I explained to the patient that the cancer is due to her tobacco dipping habit. The patient does not appear to be interested to quit smoking.  Having said that, she has tried to cut down the consumption significantly over the past few months    Orders Placed This Encounter  Procedures  . NM PET Image Initial (PI) Skull Base To Thigh    Standing Status:   Future    Standing Expiration Date:   02/11/2018    Order Specific Question:   Reason for exam:    Answer:   oropharyngeal cancer, staging    Order Specific Question:   Preferred imaging location?    Answer:   [facility]    All questions were answered. The patient knows to call the clinic with any problems, questions or  concerns. I spent 60 minutes counseling the patient face to face. The total time spent in the appointment was 60 minutes and more than 50% was on counseling.     Heath Lark, MD 01/07/17 4:20 PM

## 2017-01-07 NOTE — Assessment & Plan Note (Signed)
This patient have chronic leukoplakia in the beginning and subsequently was diagnosed with invasive squamous cell carcinoma due to her chewing habit. She was offered surgery but the patient declined surgery. For some reason, she got the impression that chemotherapy may provide a cure without major side effects. The patient has significant comorbidities including history of stroke 3 and history of myocardial infarction. Although she does not have major neurological deficit and she does not have ongoing history of angina symptoms, the patient is very sedentary at home and rely on multiple family members for most activities of daily living.  She does not even take a shower. The patient have intermittent, recurrent, acute on chronic renal failure. Her last serum creatinine and put her on stage IV chronic kidney disease She is not a good candidate for systemic chemotherapy.  At most, the role of chemotherapy would be strictly palliative. I reviewed with her and her son the current guidelines. Local therapy such as surgery or palliative radiation treatment are consider mainstay and first choice options. I will get her case presented at the next ENT tumor board. I will order a PET CT scan to complete her staging. We will get referral to radiation oncology. I will see her back as soon as PET CT scan is available for treatment recommendations

## 2017-01-10 ENCOUNTER — Encounter: Payer: Self-pay | Admitting: Radiation Oncology

## 2017-01-13 ENCOUNTER — Ambulatory Visit (HOSPITAL_COMMUNITY): Payer: Medicare Other

## 2017-01-13 ENCOUNTER — Telehealth: Payer: Self-pay | Admitting: *Deleted

## 2017-01-13 NOTE — Telephone Encounter (Signed)
Called patient with PET instructions.  States she can only do it if "put to sleep"  PET cancelled per Dr Alvy Bimler

## 2017-01-17 NOTE — Progress Notes (Signed)
Head and Neck Cancer Location of Tumor / Histology:   Patient presented  months ago with symptoms of: She was referred by her surgeon at [facility] due to persistent and recurrent invasive squamous cell carcinoma.  Biopsies of  revealed: This is the most recent biopsy found. 10/11/14 10/11/2014 Pathology Results     ACCESSION [id number] RECEIVED: 10/11/2014 ORDERING PHYSICIAN:JOSHUA DOUGLASS Nicolette Bang , MD PATIENT NAME:[name] [name], [name] [name] [facility] SURGICAL PATHOLOGY REPORT  FINAL PATHOLOGIC DIAGNOSIS MICROSCOPIC EXAMINATION AND DIAGNOSIS  A.MIDLINE LOWER LIP LESION, EXCISION: Verrucous carcinoma, extending to all the margins.  B.LEFT UPPER GINGIVA LESION, EXCISION: Invasive squamous cell carcinoma, extending to all the margins.  C.LEFT BUCCAL LESION, EXCISION: Invasive squamous cell carcinoma, extending to all the margins.        Nutrition Status Yes No Comments  Weight changes? []  [x]    Swallowing concerns? []  [x]    PEG? []  [x]     Referrals Yes No Comments  Social Work? []  [x]    Dentistry? []  [x]    Swallowing therapy? []  [x]    Nutrition? []  [x]    Med/Onc? []  []  Dr. Alvy Bimler 01/07/17   Safety Issues Yes No Comments  Prior radiation? []  [x]    Pacemaker/ICD? []  [x]    Possible current pregnancy? []  [x]    Is the patient on methotrexate? []  [x]     Tobacco/Marijuana/Snuff/ETOH use: She is a former smoker 1958-1973. 1/4 pack for 15 years. She is currently using snuff. No current alcohol use.   Past/Anticipated interventions by otolaryngology, if any:  Past/Anticipated interventions by medical oncology, if any:  Dr. Alvy Bimler 01/07/17 She is not a good candidate for systemic chemotherapy.  At most, the role of chemotherapy would be strictly palliative. I reviewed with her and her son the current guidelines. Local therapy such as surgery or palliative radiation treatment are consider mainstay and first choice options. I will get her case  presented at the next ENT tumor board. I will order a PET CT scan to complete her staging. We will get referral to radiation oncology. I will see her back as soon as PET CT scan is available for treatment recommendations  **PET scan cancelled by Dr. Alvy Bimler. Patient called and she stated she would have to be put to sleep to have the scan.  ---Patient tells [place] today that she misunderstood the question. She felt like she could have a PET scan done with something to help her relax.    Current Complaints / other details:   10/14/2014 Surgery     At [facility], she underwent a resection of the left labial, buccal, hard palate and gingival lesion on 10/14/14. Pathology showed invasive squamous cell carcinoma. She was scheduled to undergo additional resection on the right in March 2016 but cancelled the surgery.     PET cancelled per Dr. Alvy Bimler.   BP 129/72   Pulse 77   Temp 98.3 F (36.8 C)   Ht 5' (1.524 m)   Wt 186 lb 9.6 oz (84.6 kg)   SpO2 98% Comment: room air  BMI 36.44 kg/m    Wt Readings from Last 3 Encounters:  01/18/17 186 lb 9.6 oz (84.6 kg)  01/07/17 188 lb 11.2 oz (85.6 kg)  10/20/16 187 lb 3.2 oz (84.9 kg)

## 2017-01-18 ENCOUNTER — Ambulatory Visit
Admission: RE | Admit: 2017-01-18 | Discharge: 2017-01-18 | Disposition: A | Discharge status: Home / Self Care | Payer: Medicare Other | Source: Ambulatory Visit | Attending: Radiation Oncology | Admitting: Radiation Oncology

## 2017-01-18 ENCOUNTER — Encounter: Payer: Self-pay | Admitting: Radiation Oncology

## 2017-01-18 ENCOUNTER — Encounter: Payer: Self-pay | Admitting: *Deleted

## 2017-01-18 VITALS — BP 129/72 | HR 77 | Temp 98.3°F | Ht 60.0 in | Wt 186.6 lb

## 2017-01-18 DIAGNOSIS — C060 Malignant neoplasm of cheek mucosa: Secondary | ICD-10-CM | POA: Insufficient documentation

## 2017-01-18 DIAGNOSIS — F329 Major depressive disorder, single episode, unspecified: Secondary | ICD-10-CM | POA: Insufficient documentation

## 2017-01-18 DIAGNOSIS — F419 Anxiety disorder, unspecified: Secondary | ICD-10-CM | POA: Insufficient documentation

## 2017-01-18 DIAGNOSIS — E785 Hyperlipidemia, unspecified: Secondary | ICD-10-CM | POA: Insufficient documentation

## 2017-01-18 DIAGNOSIS — E119 Type 2 diabetes mellitus without complications: Secondary | ICD-10-CM | POA: Insufficient documentation

## 2017-01-18 DIAGNOSIS — C069 Malignant neoplasm of mouth, unspecified: Secondary | ICD-10-CM | POA: Insufficient documentation

## 2017-01-18 DIAGNOSIS — Z8673 Personal history of transient ischemic attack (TIA), and cerebral infarction without residual deficits: Secondary | ICD-10-CM | POA: Insufficient documentation

## 2017-01-18 DIAGNOSIS — I10 Essential (primary) hypertension: Secondary | ICD-10-CM | POA: Diagnosis not present

## 2017-01-18 DIAGNOSIS — I252 Old myocardial infarction: Secondary | ICD-10-CM | POA: Diagnosis not present

## 2017-01-18 DIAGNOSIS — K219 Gastro-esophageal reflux disease without esophagitis: Secondary | ICD-10-CM | POA: Insufficient documentation

## 2017-01-18 NOTE — Progress Notes (Signed)
Radiation Oncology         [phone]) [phone] ________________________________  Initial outpatient Consultation  Name: [name] [name] MRN: [id number]  Date: 01/18/2017  DOB: 1941-06-12  UX:NATFT,DDUKGURK J., MD  Heath Lark, MD   REFERRING PHYSICIAN: Heath Lark, MD  DIAGNOSIS:  Cancer Staging Squamous cell cancer of buccal mucosa ([place]) Staging form: Lip and Oral Cavity, AJCC 8th Edition - Clinical: Stage IVA (cT4a, cN0, cM0) - Signed by Eppie Gibson, MD on 01/19/2017    ICD-9-CM ICD-10-CM   1. Squamous cell cancer of buccal mucosa (HCC) 145.0 C06.0   2. 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 female [place] and has been chewing tobacco since age 88.  She has been followed for oral leukoplakia and squamous cell carcinoma by Dr. Nicolette Bang for years.  Most recently he noted progression and recommended surgery.  She is adamant against surgery.  She was hopeful for systemic therapy but Dr Alvy Bimler felt that the risks far outweighed potential gains.  The patient reports some difficulty with eating due to her lesions and burning sensation in her mouth.  She is with her son in law today. She missed the PET that was ordered recently for staging.    Squamous cell cancer of buccal mucosa ([place])   09/06/2002 Pathology Results    1. ORAL MUCOSA, BIOPSY, ANTERIOR BUCCAL: SQUAMOUS DYSPLASIA, SEE DESCRIPTION  2. ORAL MUCOSA, BIOPSY, POSTERIOR BUCCAL: SQUAMOUS DYSPLASIA, SEE DESCRIPTION      09/29/2012 Pathology Results    ACCESSION NUMBER: [id number] RECEIVED: 10/02/2012 ORDERING PHYSICIAN:JOSHUA Tommas Olp , MD PATIENT NAME:Araki, Argie [facility] SURGICAL PATHOLOGY REPORT  FINAL PATHOLOGIC DIAGNOSIS MICROSCOPIC EXAMINATION AND DIAGNOSIS  CONSULT SLIDES, OUTSIDE CASE [phone]  SOFT TISSUE, LEFT BUCCAL MUCOSA, BIOPSY  (1): Verruca vulgaris, inflamed.  SOFT TISSUE, RIGHT GINGIVAL SULCUS, BIOPSY (2): Verruca vulgaris, inflamed.      05/22/2014 Pathology Results    SKIN, LEFT LIP, BIOPSY: Verruca vulgaris and a surface of an epidermal cyst.      10/11/2014 Pathology Results    ACCESSION [id number] RECEIVED: 10/11/2014 ORDERING PHYSICIAN:JOSHUA DOUGLASS Nicolette Bang , MD PATIENT NAME:Vialpando, Pamlea [facility] SURGICAL PATHOLOGY REPORT  FINAL PATHOLOGIC DIAGNOSIS MICROSCOPIC EXAMINATION AND DIAGNOSIS  A.MIDLINE LOWER LIP LESION, EXCISION: Verrucous carcinoma, extending to all the margins.  B.LEFT UPPER GINGIVA LESION, EXCISION: Invasive squamous cell carcinoma, extending to all the margins.  C.LEFT BUCCAL LESION, EXCISION: Invasive squamous cell carcinoma, extending to all the margins.      10/14/2014 Surgery    At [facility], she underwent a resection of the left labial, buccal, hard palate and gingival lesion on 10/14/14. Pathology showed invasive squamous cell carcinoma. She was scheduled to undergo additional resection on the right in March 2016 but cancelled the surgery.           11/21/2014 Imaging    1. Limited examination secondary to lack of intravenous contrast without mass lesion identified. 2. Asymmetric diffuse irregularity of the left maxillary alveolar surface without associated soft tissue mass. Direct inspection is recommended. 3. Soft tissue density contiguous with the posterior margin of the proximal cervical esophagus as detailed above may reflect a Zenker's diverticulum. Appearance is not typical for neoplastic disease. This could be verified with barium swallow if clinically indicated.     Marland Kitchen  PREVIOUS RADIATION THERAPY: No  PAST MEDICAL HISTORY:  has a past medical history of  Anxiety; Cancer ([place]); Depression; Diabetes mellitus without complication ([place]); GERD (gastroesophageal reflux disease); Hyperlipidemia; Hypertension;  Myocardial infarction; and Stroke [facility]    PAST SURGICAL HISTORY: Past Surgical History:  Procedure Laterality Date  . ABDOMINAL HYSTERECTOMY    . CATARACT EXTRACTION W/PHACO Right 03/20/2013   Procedure: CATARACT EXTRACTION PHACO AND INTRAOCULAR LENS PLACEMENT (IOC);  Surgeon: Elta Guadeloupe T. Gershon Crane, MD;  Location: [facility];  Service: Ophthalmology;  Laterality: Right;  CDE:  6.29   . CHOLECYSTECTOMY    . COLOSTOMY    . OOPHORECTOMY    . TONSILLECTOMY      FAMILY HISTORY: family history includes Abnormal EKG in her other; Arthritis in her other; Asthma in her other; Cancer in her other; Diabetes in her other; Kidney disease in her other.  SOCIAL HISTORY:  reports that she quit smoking about 44 years ago. Her smoking use included Cigarettes. She started smoking about 60 years ago. She has a 3.75 pack-year smoking history. Her smokeless tobacco use includes Snuff. She reports that she does not drink alcohol or use drugs.  ALLERGIES: Ciprofloxacin; Aspirin; Penicillins; Propoxyphene n-acetaminophen; and Sulfamethoxazole  MEDICATIONS:  Current Outpatient Prescriptions  Medication Sig Dispense Refill  . acetaminophen (TYLENOL) 500 MG tablet Take 1 tablet (500 mg total) by mouth every 6 (six) hours as needed for mild pain.    Marland Kitchen ALPRAZolam (XANAX) 0.5 MG tablet Take 1 tablet (0.5 mg total) by mouth 4 (four) times daily as needed for anxiety or sleep. 10 tablet 0  . amLODipine (NORVASC) 5 MG tablet Take 1 tablet (5 mg total) by mouth daily. 90 tablet 3  . cholecalciferol (VITAMIN D) 1000 units tablet Take 1,000 Units by mouth daily. 2000 units daily    . clopidogrel (PLAVIX) 75 MG tablet Take 1 tablet (75 mg total) by mouth daily. 30 tablet 6  . escitalopram (LEXAPRO) 20 MG tablet Take 20 mg by mouth daily.    Marland Kitchen ezetimibe (ZETIA) 10 MG tablet Take 10 mg by mouth daily.    . feeding supplement, RESOURCE BREEZE, (RESOURCE BREEZE) LIQD Take 1 Container by mouth 3 (three) times daily between meals.  0  .  HYDROcodone-acetaminophen (NORCO) 10-325 MG per tablet Take 1 tablet by mouth every 6 (six) hours as needed for moderate pain. (Patient taking differently: Take 0.5-1 tablets by mouth every 6 (six) hours as needed for moderate pain. ) 10 tablet 0  . NITROSTAT 0.4 MG SL tablet Place 0.4 mg under the tongue every 5 (five) minutes as needed for chest pain.      No current facility-administered medications for this encounter.     REVIEW OF SYSTEMS:  Notable for that above. No trouble swallowing.  Has colostomy due to diverticulitis. No weight changes.   PHYSICAL EXAM:   Vitals - 1 value per visit 1/79/1505  SYSTOLIC 697  DIASTOLIC 72  Pulse 77  Temperature 98.3  Respirations   Weight (lb) 186.6  Height 5\' 0"   BMI 36.44  VISIT REPORT    General: Alert and oriented, in no acute distress HEENT: Head is normocephalic. Extraocular movements are intact. Oropharynx is notable for diffuse crusting of the outer lips and a raised lesion at the lateral commissure (right) of the lips consistent with squamous cell carcinoma.  Diffuse buccal lesions throughout mouth, also diffuse gingival lesions, and a boggy, swollen, erythematous floor of mouth Neck: Neck is notable for no obvious masses to palpation Heart: Regular in rate and rhythm  Chest: Clear to auscultation bilaterally, with no rhonchi, wheezes, or  rales. Abdomen: Soft, nontender, nondistended, with no rigidity or guarding. + colostomy bag Lymphatics: see Neck Exam Neurologic: Cranial nerves II through XII are grossly intact. No obvious focalities. Psychiatric: Judgment and insight are intact. Affect is appropriate.   ECOG = 3  0 - Asymptomatic (Fully active, able to carry on all predisease activities without restriction)  1 - Symptomatic but completely ambulatory (Restricted in physically strenuous activity but ambulatory and able to carry out work of a light or sedentary nature. For example, light housework, office work)  2 -  Symptomatic, <50% in bed during the day (Ambulatory and capable of all self care but unable to carry out any work activities. Up and about more than 50% of waking hours)  3 - Symptomatic, >50% in bed, but not bedbound (Capable of only limited self-care, confined to bed or chair 50% or more of waking hours)  4 - Bedbound (Completely disabled. Cannot carry on any self-care. Totally confined to bed or chair)  5 - Death   Eustace Pen MM, Creech RH, Tormey DC, et al. [phone]). "Toxicity and response criteria of the [facility]". [place] Oncol. 5 (6): 649-55   LABORATORY DATA:  Lab Results  Component Value Date   WBC 9.2 03/15/2015   HGB 9.0 (L) 03/15/2015   HCT 26.4 (L) 03/15/2015   MCV 86.3 03/15/2015   PLT 226 03/15/2015   CMP     Component Value Date/Time   NA 141 03/16/2015 0551   K 4.7 03/16/2015 0551   CL 114 (H) 03/16/2015 0551   CO2 23 03/16/2015 0551   GLUCOSE 82 03/16/2015 0551   BUN 40 (H) 03/16/2015 0551   CREATININE 1.90 (H) 03/16/2015 0551   CALCIUM 8.3 (L) 03/16/2015 0551   PROT 6.0 (L) 03/11/2015 0527   ALBUMIN 3.3 (L) 03/11/2015 0527   AST 8 (L) 03/11/2015 0527   ALT 9 (L) 03/11/2015 0527   ALKPHOS 78 03/11/2015 0527   BILITOT 0.5 03/11/2015 0527   GFRNONAA 25 (L) 03/16/2015 0551   GFRAA 29 (L) 03/16/2015 0551       RADIOGRAPHY: No results found.    IMPRESSION/PLAN:  This is a delightful patient with head and neck cancer. I do not recommend radiotherapy for this patient.  Unfortunately, this disease is unlikely to procure significant palliation or control from radiotherapy.  Radiotherapy would however cause significant mucositis and taste chances.    This is a difficult case and she continues to chew tobacco.  Chance of cure, even with radical measures, is slim at best.  I reached out to Dr Nicolette Bang and the [facility] H+N Navigator, asking them to see if medical oncology at [facility] has any clinical trials or options for  immunotherapy that might possibly palliate the patient. She is not a good candidate for chemotherapy due to co morbidities.  I wished the patient the best and did not schedule followup in my clinic.  She appreciated the consult.  I spent >30 minutes face to face with the patient, over 50% on counseling and care coordination.   Eppie Gibson, MD

## 2017-01-19 NOTE — Progress Notes (Signed)
Oncology Nurse Navigator Documentation   Met with patient during initial consult with Dr. Isidore Moos.  She was accompanied by her son-in-law Charlotte Crumb.  They/we had previously consulted with Dr. Alvy Bimler 01/07/17.  Provided introductory explanation of radiation treatment including SIM planning and purpose of Aquaplast head and shoulder mask, showed them example.    They voiced understanding of Dr. Pearlie Oyster discussion of her diagnosis, surgery as primary option for treatment, her referral back to Dr. Nicolette Bang to discuss further.  Gayleen Orem, RN, BSN, [place] Oncology Nurse [place] at [place] [phone]

## 2017-01-24 ENCOUNTER — Encounter (HOSPITAL_COMMUNITY): Payer: Medicare Other

## 2017-01-27 DIAGNOSIS — J449 Chronic obstructive pulmonary disease, unspecified: Secondary | ICD-10-CM | POA: Diagnosis not present

## 2017-01-27 DIAGNOSIS — K5792 Diverticulitis of intestine, part unspecified, without perforation or abscess without bleeding: Secondary | ICD-10-CM | POA: Diagnosis not present

## 2017-01-27 DIAGNOSIS — E119 Type 2 diabetes mellitus without complications: Secondary | ICD-10-CM | POA: Diagnosis not present

## 2017-01-27 DIAGNOSIS — I509 Heart failure, unspecified: Secondary | ICD-10-CM | POA: Diagnosis not present

## 2017-02-05 ENCOUNTER — Encounter (HOSPITAL_COMMUNITY): Payer: Self-pay | Admitting: Cardiology

## 2017-02-05 ENCOUNTER — Emergency Department (HOSPITAL_COMMUNITY)
Admission: EM | Admit: 2017-02-05 | Discharge: 2017-02-05 | Disposition: A | Discharge status: Home Health | Payer: Medicare Other | Attending: Emergency Medicine | Admitting: Emergency Medicine

## 2017-02-05 ENCOUNTER — Emergency Department (HOSPITAL_COMMUNITY): Payer: Medicare Other

## 2017-02-05 DIAGNOSIS — N189 Chronic kidney disease, unspecified: Secondary | ICD-10-CM | POA: Diagnosis not present

## 2017-02-05 DIAGNOSIS — R42 Dizziness and giddiness: Secondary | ICD-10-CM | POA: Diagnosis not present

## 2017-02-05 DIAGNOSIS — R071 Chest pain on breathing: Secondary | ICD-10-CM | POA: Insufficient documentation

## 2017-02-05 DIAGNOSIS — Z79899 Other long term (current) drug therapy: Secondary | ICD-10-CM | POA: Diagnosis not present

## 2017-02-05 DIAGNOSIS — F17290 Nicotine dependence, other tobacco product, uncomplicated: Secondary | ICD-10-CM | POA: Insufficient documentation

## 2017-02-05 DIAGNOSIS — I129 Hypertensive chronic kidney disease with stage 1 through stage 4 chronic kidney disease, or unspecified chronic kidney disease: Secondary | ICD-10-CM | POA: Diagnosis not present

## 2017-02-05 DIAGNOSIS — R0789 Other chest pain: Secondary | ICD-10-CM | POA: Diagnosis not present

## 2017-02-05 DIAGNOSIS — E119 Type 2 diabetes mellitus without complications: Secondary | ICD-10-CM | POA: Diagnosis not present

## 2017-02-05 DIAGNOSIS — R079 Chest pain, unspecified: Secondary | ICD-10-CM

## 2017-02-05 HISTORY — DX: Disorder of kidney and ureter, unspecified: N28.9

## 2017-02-05 LAB — CBC WITH DIFFERENTIAL/PLATELET
BASOS ABS: 0.1 10*3/uL (ref 0.0–0.1)
Basophils Relative: 1 %
Eosinophils Absolute: 0.3 10*3/uL (ref 0.0–0.7)
Eosinophils Relative: 3 %
HEMATOCRIT: 39.4 % (ref 36.0–46.0)
Hemoglobin: 13 g/dL (ref 12.0–15.0)
LYMPHS PCT: 22 %
Lymphs Abs: 2.4 10*3/uL (ref 0.7–4.0)
MCH: 29.9 pg (ref 26.0–34.0)
MCHC: 33 g/dL (ref 30.0–36.0)
MCV: 90.6 fL (ref 78.0–100.0)
Monocytes Absolute: 0.9 10*3/uL (ref 0.1–1.0)
Monocytes Relative: 8 %
NEUTROS ABS: 7 10*3/uL (ref 1.7–7.7)
Neutrophils Relative %: 66 %
PLATELETS: 251 10*3/uL (ref 150–400)
RBC: 4.35 MIL/uL (ref 3.87–5.11)
RDW: 13.1 % (ref 11.5–15.5)
WBC: 10.6 10*3/uL — AB (ref 4.0–10.5)

## 2017-02-05 LAB — BASIC METABOLIC PANEL
Anion gap: 10 (ref 5–15)
BUN: 19 mg/dL (ref 6–20)
CO2: 28 mmol/L (ref 22–32)
Calcium: 10.2 mg/dL (ref 8.9–10.3)
Chloride: 103 mmol/L (ref 101–111)
Creatinine, Ser: 1.43 mg/dL — ABNORMAL HIGH (ref 0.44–1.00)
GFR, EST AFRICAN AMERICAN: 40 mL/min — AB (ref >60)
GFR, EST NON AFRICAN AMERICAN: 35 mL/min — AB (ref >60)
GLUCOSE: 112 mg/dL — AB (ref 65–99)
POTASSIUM: 4.2 mmol/L (ref 3.5–5.1)
Sodium: 141 mmol/L (ref 135–145)

## 2017-02-05 LAB — TROPONIN I: Troponin I: <0.03 ng/mL (ref <0.03)

## 2017-02-05 MED ORDER — SODIUM CHLORIDE 0.9 % IV BOLUS (SEPSIS)
1000.0000 mL | Freq: Once | INTRAVENOUS | Status: AC
  Administered 2017-02-05: 1000 mL via INTRAVENOUS

## 2017-02-05 NOTE — Discharge Instructions (Signed)
Your blood pressure was slightly low. Increase fluids. Do not arise quickly. Follow-up your primary care doctor.

## 2017-02-05 NOTE — ED Provider Notes (Signed)
[facility] DEPT Provider Note   CSN: [id number] Arrival date & time: 02/05/17  1058   By signing my name below, I, Eunice Blase, attest that this documentation has been prepared under the direction and in the presence of [name], MD. Electronically signed, Eunice Blase, ED Scribe. 02/05/17. 1:58 PM.   History   Chief Complaint Chief Complaint  Patient presents with  . Chest Pain   The history is provided by the patient and medical records. No language interpreter was used.    HPI Comments: [name] [name] is a 76 y.o. female with Hx of MI, Stroke, tobacco use, chest pain and dyspnea on exertion BIB EMS who presents to the Emergency Department complaining of sudden onset chest pain that has subsided. Pt states her chest pain is exacerbated with movement. She states upon waking this morning, she grabbed a lucinda, sat down to drink it and when she stood up, she began to experience room spinning dizziness and chest pain. She notes associated SOB that has subsided, dizziness that has subsided and diaphoresis that has subsided. She takes vitamin D3 supplements, nitroglycerine x 7-8 years that she did not take this morning, Prazolam, clopridegrel that she took this morning and amlodipine that she took this morning. Hx of colostomy placement noted with no reported recent complications. Pt ambulatory with cane at home. She denies recent URI symptoms.  Past Medical History:  Diagnosis Date  . Anxiety   . Cancer ([place])   . Depression   . Diabetes mellitus without complication ([place])   . GERD (gastroesophageal reflux disease)   . Hyperlipidemia   . Hypertension   . Myocardial infarction   . Renal disorder    kidney failure   . Stroke [facility])    times 2    Patient Active Problem List   Diagnosis Date Noted  . Squamous cell cancer of buccal mucosa ([place]) 01/07/2017  . Chronic kidney disease (CKD), stage IV (severe) ([place]) 01/07/2017  . Dehydration 03/10/2015  . Protein-calorie  malnutrition, severe ([place]) 12/24/2014  . Metabolic acidosis 53/97/6734  . Hypovolemic shock ([place]) 12/24/2014  . Encephalopathy acute 10/21/2014  . CKD (chronic kidney disease) 10/21/2014  . Fall at home 10/21/2014  . H/O: CVA (cerebrovascular accident) 10/21/2014  . Altered bowel elimination due to intestinal ostomy ([place]) 10/21/2014  . 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 Acute on chronic renal failure ([place]) 03/11/2014  . Contusion of left hip 03/10/2014  . Hip pain 03/10/2014  . DOE (dyspnea on exertion) 08/18/2012  . Chest pain 08/04/2011  . Tobacco dipper 04/29/2011  . Anxiety   . Depression   . Hypertension   . GERD (gastroesophageal reflux disease)   . Hyperlipidemia   . Myocardial infarction   . Stroke ([place])   . CAD, NATIVE VESSEL 02/20/2010  . SHOULDER PAIN 10/09/2007  . RUPTURE ROTATOR CUFF 10/09/2007    Past Surgical History:  Procedure Laterality Date  . ABDOMINAL HYSTERECTOMY    . CATARACT EXTRACTION W/PHACO Right 03/20/2013   Procedure: CATARACT EXTRACTION PHACO AND INTRAOCULAR LENS PLACEMENT (IOC);  Surgeon: Elta Guadeloupe T. Gershon Crane, MD;  Location: [facility];  Service: Ophthalmology;  Laterality: Right;  CDE:  6.29   . CHOLECYSTECTOMY    . COLOSTOMY    . OOPHORECTOMY    . TONSILLECTOMY      OB History    No data available       Home Medications    Prior to Admission medications  Medication Sig Start Date End Date Taking? Authorizing Provider  ALPRAZolam Duanne Moron) 0.5 MG tablet Take 1 tablet (0.5 mg total) by mouth 4 (four) times daily as needed for anxiety or sleep. 12/30/14  Yes Samuella Cota, MD  amLODipine (NORVASC) 5 MG tablet Take 5 mg by mouth daily.  07/22/16  Yes Historical Provider, MD  cholecalciferol (VITAMIN D) 1000 units tablet Take 1,000 Units by mouth daily. 2000 units daily   Yes Historical Provider, MD  clopidogrel (PLAVIX) 75 MG tablet Take 1 tablet (75 mg total) by mouth daily.  09/26/12  Yes Renella Cunas, MD  escitalopram (LEXAPRO) 20 MG tablet Take 20 mg by mouth daily.   Yes Historical Provider, MD  ezetimibe (ZETIA) 10 MG tablet Take 10 mg by mouth daily.   Yes Historical Provider, MD  feeding supplement, RESOURCE BREEZE, (RESOURCE BREEZE) LIQD Take 1 Container by mouth 3 (three) times daily between meals. 12/30/14  Yes Samuella Cota, MD  HYDROcodone-acetaminophen [facility]) 10-325 MG per tablet Take 1 tablet by mouth every 6 (six) hours as needed for moderate pain. Patient taking differently: Take 0.5-1 tablets by mouth every 6 (six) hours as needed for moderate pain.  12/30/14  Yes Samuella Cota, MD  ROZEREM 8 MG tablet Take 8 mg by mouth at bedtime. 12/08/16  Yes Historical Provider, MD  acetaminophen (TYLENOL) 500 MG tablet Take 1 tablet (500 mg total) by mouth every 6 (six) hours as needed for mild pain. 12/30/14   Samuella Cota, MD  amLODipine (NORVASC) 5 MG tablet Take 1 tablet (5 mg total) by mouth daily. 07/22/16 10/20/16  Arnoldo Lenis, MD  NITROSTAT 0.4 MG SL tablet Place 0.4 mg under the tongue every 5 (five) minutes as needed for chest pain.  07/28/12   Historical Provider, MD    Family History Family History  Problem Relation Age of Onset  . Cancer Other   . Diabetes Other   . Abnormal EKG Other   . Arthritis Other   . Asthma Other   . Kidney disease Other     Social History Social History  Substance Use Topics  . Smoking status: Former Smoker    Packs/day: 0.25    Years: 15.00    Types: Cigarettes    Start date: 12/12/1956    Quit date: 08/18/1972  . Smokeless tobacco: Current User    Types: Snuff     Comment: 1-2 cigs daily x 6 months  . Alcohol use No     Allergies   Ciprofloxacin; Aspirin; Penicillins; Propoxyphene n-acetaminophen; and Sulfamethoxazole   Review of Systems Review of Systems  Constitutional: Positive for diaphoresis. Negative for chills and fever.  HENT: Negative for congestion, postnasal drip,  rhinorrhea and sore throat.   Respiratory: Positive for shortness of breath. Negative for cough.   Cardiovascular: Positive for chest pain.  Neurological: Positive for dizziness.  All other systems reviewed and are negative.    Physical Exam Updated Vital Signs BP (!) 105/47   Pulse 80   Resp 19   Ht 5' (1.524 m)   Wt 185 lb (83.9 kg)   SpO2 95%   BMI 36.13 kg/m   Physical Exam  Constitutional: She is oriented to person, place, and time. She appears well-developed and well-nourished.  Alert, pleasant, pale but NAD. Obese.  HENT:  Head: Normocephalic and atraumatic.  Eyes: Conjunctivae are normal.  Neck: Neck supple.  Cardiovascular: Normal rate and regular rhythm.   Pulmonary/Chest: Effort normal and breath sounds normal.  Abdominal: Soft. Bowel sounds are normal.  Functioning colostomy LLQ  Musculoskeletal: Normal range of motion. She exhibits no edema.  Extremity exam shows no edema  Neurological: She is alert and oriented to person, place, and time.  Skin: Skin is warm and dry.  Psychiatric: She has a normal mood and affect. Her behavior is normal.  Nursing note and vitals reviewed.    ED Treatments / Results  DIAGNOSTIC STUDIES: Oxygen Saturation is 95% on room air which is normal by my interpretation.   COORDINATION OF CARE: 11:42 AM Discussed treatment plan with pt at bedside and pt agreed to plan. Will check blood work and order IV fluids.  Labs (all labs ordered are listed, but only abnormal results are displayed) Labs Reviewed  CBC WITH DIFFERENTIAL/PLATELET - Abnormal; Notable for the following:       Result Value   WBC 10.6 (*)    All other components within normal limits  BASIC METABOLIC PANEL - Abnormal; Notable for the following:    Glucose, Bld 112 (*)    Creatinine, Ser 1.43 (*)    GFR calc non Af Amer 35 (*)    GFR calc Af Amer 40 (*)    All other components within normal limits  TROPONIN I  URINALYSIS, ROUTINE W REFLEX MICROSCOPIC     EKG  EKG Interpretation None       Radiology Dg Chest Port 1 View  Result Date: 02/05/2017 CLINICAL DATA:  CHEST PAIN, DIZZINESS STARTED THIS MORNING PER PATIENT HISTORY OF MI, DM, CANCER, HTN EXAM: PORTABLE CHEST 1 VIEW COMPARISON:  03/10/2015 FINDINGS: The heart size and mediastinal contours are within normal limits. Clear lungs.  No pleural effusion or pneumothorax. Skeletal structures are demineralized but intact. IMPRESSION: No active disease. Electronically Signed   By: Lajean Manes M.D.   On: 02/05/2017 12:14    Procedures Procedures (including critical care time)  Medications Ordered in ED Medications  sodium chloride 0.9 % bolus 1,000 mL (1,000 mLs Intravenous New Bag/Given 02/05/17 1220)     Initial Impression / Assessment and Plan / ED Course  I have reviewed the triage vital signs and the nursing notes.  Pertinent labs & imaging results that were available during my care of the patient were reviewed by me and considered in my medical decision making (see chart for details).     Patient is alert and stable. Her blood pressure is slightly soft. She feels much better. EKG, chest x-ray, troponin all negative. Creatinine has improved. These findings were discussed with the patient and her son. She has follow-up.  Final Clinical Impressions(s) / ED Diagnoses   Final diagnoses:  Vertigo  Chest pain, unspecified type    New Prescriptions New Prescriptions   No medications on file   I personally performed the services described in this documentation, which was scribed in my presence. The recorded information has been reviewed and is accurate.     [name], MD 02/05/17 1400

## 2017-02-05 NOTE — ED Triage Notes (Signed)
Sitting in chair this morning and had a sudden  Onset of chest pain, with diaphoresis.  c/o feeling dizzy.  Pain worse with movement.  CBG 137.  Orthostatics normal with EMS.

## 2017-02-21 DIAGNOSIS — Z933 Colostomy status: Secondary | ICD-10-CM | POA: Diagnosis not present

## 2017-02-21 DIAGNOSIS — E1129 Type 2 diabetes mellitus with other diabetic kidney complication: Secondary | ICD-10-CM | POA: Diagnosis not present

## 2017-02-27 DIAGNOSIS — I509 Heart failure, unspecified: Secondary | ICD-10-CM | POA: Diagnosis not present

## 2017-02-27 DIAGNOSIS — E119 Type 2 diabetes mellitus without complications: Secondary | ICD-10-CM | POA: Diagnosis not present

## 2017-02-27 DIAGNOSIS — J449 Chronic obstructive pulmonary disease, unspecified: Secondary | ICD-10-CM | POA: Diagnosis not present

## 2017-03-07 DIAGNOSIS — Z79891 Long term (current) use of opiate analgesic: Secondary | ICD-10-CM | POA: Diagnosis not present

## 2017-03-07 DIAGNOSIS — N184 Chronic kidney disease, stage 4 (severe): Secondary | ICD-10-CM | POA: Diagnosis not present

## 2017-03-07 DIAGNOSIS — G894 Chronic pain syndrome: Secondary | ICD-10-CM | POA: Diagnosis not present

## 2017-03-07 DIAGNOSIS — C069 Malignant neoplasm of mouth, unspecified: Secondary | ICD-10-CM | POA: Diagnosis not present

## 2017-03-07 DIAGNOSIS — E1129 Type 2 diabetes mellitus with other diabetic kidney complication: Secondary | ICD-10-CM | POA: Diagnosis not present

## 2017-03-15 DIAGNOSIS — C069 Malignant neoplasm of mouth, unspecified: Secondary | ICD-10-CM | POA: Diagnosis not present

## 2017-03-15 DIAGNOSIS — C060 Malignant neoplasm of cheek mucosa: Secondary | ICD-10-CM | POA: Diagnosis not present

## 2017-03-25 DIAGNOSIS — D509 Iron deficiency anemia, unspecified: Secondary | ICD-10-CM | POA: Diagnosis not present

## 2017-03-25 DIAGNOSIS — Z79899 Other long term (current) drug therapy: Secondary | ICD-10-CM | POA: Diagnosis not present

## 2017-03-25 DIAGNOSIS — I10 Essential (primary) hypertension: Secondary | ICD-10-CM | POA: Diagnosis not present

## 2017-03-25 DIAGNOSIS — R809 Proteinuria, unspecified: Secondary | ICD-10-CM | POA: Diagnosis not present

## 2017-03-25 DIAGNOSIS — N183 Chronic kidney disease, stage 3 (moderate): Secondary | ICD-10-CM | POA: Diagnosis not present

## 2017-03-29 DIAGNOSIS — I509 Heart failure, unspecified: Secondary | ICD-10-CM | POA: Diagnosis not present

## 2017-03-29 DIAGNOSIS — E119 Type 2 diabetes mellitus without complications: Secondary | ICD-10-CM | POA: Diagnosis not present

## 2017-03-29 DIAGNOSIS — J449 Chronic obstructive pulmonary disease, unspecified: Secondary | ICD-10-CM | POA: Diagnosis not present

## 2017-03-30 DIAGNOSIS — N250 Renal osteodystrophy: Secondary | ICD-10-CM | POA: Diagnosis not present

## 2017-03-30 DIAGNOSIS — N183 Chronic kidney disease, stage 3 (moderate): Secondary | ICD-10-CM | POA: Diagnosis not present

## 2017-04-05 DIAGNOSIS — Z8673 Personal history of transient ischemic attack (TIA), and cerebral infarction without residual deficits: Secondary | ICD-10-CM | POA: Diagnosis not present

## 2017-04-05 DIAGNOSIS — Z933 Colostomy status: Secondary | ICD-10-CM | POA: Diagnosis not present

## 2017-04-05 DIAGNOSIS — C801 Malignant (primary) neoplasm, unspecified: Secondary | ICD-10-CM | POA: Diagnosis not present

## 2017-04-05 DIAGNOSIS — Z720 Tobacco use: Secondary | ICD-10-CM | POA: Diagnosis not present

## 2017-04-05 DIAGNOSIS — C060 Malignant neoplasm of cheek mucosa: Secondary | ICD-10-CM | POA: Diagnosis not present

## 2017-04-13 DIAGNOSIS — Z1389 Encounter for screening for other disorder: Secondary | ICD-10-CM | POA: Diagnosis not present

## 2017-04-13 DIAGNOSIS — E1129 Type 2 diabetes mellitus with other diabetic kidney complication: Secondary | ICD-10-CM | POA: Diagnosis not present

## 2017-04-13 DIAGNOSIS — Z719 Counseling, unspecified: Secondary | ICD-10-CM | POA: Diagnosis not present

## 2017-04-13 DIAGNOSIS — C060 Malignant neoplasm of cheek mucosa: Secondary | ICD-10-CM | POA: Diagnosis not present

## 2017-04-18 DIAGNOSIS — C060 Malignant neoplasm of cheek mucosa: Secondary | ICD-10-CM | POA: Diagnosis not present

## 2017-04-18 DIAGNOSIS — C801 Malignant (primary) neoplasm, unspecified: Secondary | ICD-10-CM | POA: Diagnosis not present

## 2017-04-18 DIAGNOSIS — R590 Localized enlarged lymph nodes: Secondary | ICD-10-CM | POA: Diagnosis not present

## 2017-04-29 DIAGNOSIS — E119 Type 2 diabetes mellitus without complications: Secondary | ICD-10-CM | POA: Diagnosis not present

## 2017-04-29 DIAGNOSIS — J449 Chronic obstructive pulmonary disease, unspecified: Secondary | ICD-10-CM | POA: Diagnosis not present

## 2017-04-29 DIAGNOSIS — I509 Heart failure, unspecified: Secondary | ICD-10-CM | POA: Diagnosis not present

## 2017-05-06 DIAGNOSIS — C060 Malignant neoplasm of cheek mucosa: Secondary | ICD-10-CM | POA: Diagnosis not present

## 2017-05-06 DIAGNOSIS — C801 Malignant (primary) neoplasm, unspecified: Secondary | ICD-10-CM | POA: Diagnosis not present

## 2017-05-29 DIAGNOSIS — I509 Heart failure, unspecified: Secondary | ICD-10-CM | POA: Diagnosis not present

## 2017-05-29 DIAGNOSIS — E119 Type 2 diabetes mellitus without complications: Secondary | ICD-10-CM | POA: Diagnosis not present

## 2017-05-29 DIAGNOSIS — J449 Chronic obstructive pulmonary disease, unspecified: Secondary | ICD-10-CM | POA: Diagnosis not present

## 2017-06-01 DIAGNOSIS — Z933 Colostomy status: Secondary | ICD-10-CM | POA: Diagnosis not present

## 2017-06-21 DIAGNOSIS — R5383 Other fatigue: Secondary | ICD-10-CM | POA: Diagnosis not present

## 2017-06-21 DIAGNOSIS — K1321 Leukoplakia of oral mucosa, including tongue: Secondary | ICD-10-CM | POA: Diagnosis not present

## 2017-06-21 DIAGNOSIS — R600 Localized edema: Secondary | ICD-10-CM | POA: Diagnosis not present

## 2017-06-21 DIAGNOSIS — C801 Malignant (primary) neoplasm, unspecified: Secondary | ICD-10-CM | POA: Diagnosis not present

## 2017-06-21 DIAGNOSIS — C069 Malignant neoplasm of mouth, unspecified: Secondary | ICD-10-CM | POA: Diagnosis not present

## 2017-06-29 DIAGNOSIS — J449 Chronic obstructive pulmonary disease, unspecified: Secondary | ICD-10-CM | POA: Diagnosis not present

## 2017-06-29 DIAGNOSIS — E119 Type 2 diabetes mellitus without complications: Secondary | ICD-10-CM | POA: Diagnosis not present

## 2017-06-29 DIAGNOSIS — I509 Heart failure, unspecified: Secondary | ICD-10-CM | POA: Diagnosis not present

## 2017-07-14 ENCOUNTER — Other Ambulatory Visit: Payer: Self-pay

## 2017-07-14 MED ORDER — AMLODIPINE BESYLATE 5 MG PO TABS: 5.0000 mg | ORAL_TABLET | Freq: Every day | ORAL | 3 refills | Status: DC

## 2017-07-21 DIAGNOSIS — E119 Type 2 diabetes mellitus without complications: Secondary | ICD-10-CM | POA: Diagnosis not present

## 2017-07-21 DIAGNOSIS — Z23 Encounter for immunization: Secondary | ICD-10-CM | POA: Diagnosis not present

## 2017-07-21 DIAGNOSIS — R201 Hypoesthesia of skin: Secondary | ICD-10-CM | POA: Diagnosis not present

## 2017-07-21 DIAGNOSIS — Z1389 Encounter for screening for other disorder: Secondary | ICD-10-CM | POA: Diagnosis not present

## 2017-07-21 DIAGNOSIS — I639 Cerebral infarction, unspecified: Secondary | ICD-10-CM | POA: Diagnosis not present

## 2017-07-21 DIAGNOSIS — L84 Corns and callosities: Secondary | ICD-10-CM | POA: Diagnosis not present

## 2017-07-30 DIAGNOSIS — E119 Type 2 diabetes mellitus without complications: Secondary | ICD-10-CM | POA: Diagnosis not present

## 2017-07-30 DIAGNOSIS — I509 Heart failure, unspecified: Secondary | ICD-10-CM | POA: Diagnosis not present

## 2017-07-30 DIAGNOSIS — J449 Chronic obstructive pulmonary disease, unspecified: Secondary | ICD-10-CM | POA: Diagnosis not present

## 2017-08-15 ENCOUNTER — Other Ambulatory Visit: Payer: Self-pay | Admitting: Cardiology

## 2017-08-29 DIAGNOSIS — E119 Type 2 diabetes mellitus without complications: Secondary | ICD-10-CM | POA: Diagnosis not present

## 2017-08-29 DIAGNOSIS — I509 Heart failure, unspecified: Secondary | ICD-10-CM | POA: Diagnosis not present

## 2017-08-29 DIAGNOSIS — J449 Chronic obstructive pulmonary disease, unspecified: Secondary | ICD-10-CM | POA: Diagnosis not present

## 2017-09-13 DIAGNOSIS — Z933 Colostomy status: Secondary | ICD-10-CM | POA: Diagnosis not present

## 2017-09-20 DIAGNOSIS — D509 Iron deficiency anemia, unspecified: Secondary | ICD-10-CM | POA: Diagnosis not present

## 2017-09-20 DIAGNOSIS — Z79899 Other long term (current) drug therapy: Secondary | ICD-10-CM | POA: Diagnosis not present

## 2017-09-20 DIAGNOSIS — R809 Proteinuria, unspecified: Secondary | ICD-10-CM | POA: Diagnosis not present

## 2017-09-20 DIAGNOSIS — N183 Chronic kidney disease, stage 3 (moderate): Secondary | ICD-10-CM | POA: Diagnosis not present

## 2017-09-20 DIAGNOSIS — E559 Vitamin D deficiency, unspecified: Secondary | ICD-10-CM | POA: Diagnosis not present

## 2017-09-23 DIAGNOSIS — G894 Chronic pain syndrome: Secondary | ICD-10-CM | POA: Diagnosis not present

## 2017-09-23 DIAGNOSIS — J45909 Unspecified asthma, uncomplicated: Secondary | ICD-10-CM | POA: Diagnosis not present

## 2017-09-23 DIAGNOSIS — I10 Essential (primary) hypertension: Secondary | ICD-10-CM | POA: Diagnosis not present

## 2017-09-23 DIAGNOSIS — D4989 Neoplasm of unspecified behavior of other specified sites: Secondary | ICD-10-CM | POA: Diagnosis not present

## 2017-09-23 DIAGNOSIS — E1129 Type 2 diabetes mellitus with other diabetic kidney complication: Secondary | ICD-10-CM | POA: Diagnosis not present

## 2017-09-23 DIAGNOSIS — K219 Gastro-esophageal reflux disease without esophagitis: Secondary | ICD-10-CM | POA: Diagnosis not present

## 2017-09-28 DIAGNOSIS — D649 Anemia, unspecified: Secondary | ICD-10-CM | POA: Diagnosis not present

## 2017-09-28 DIAGNOSIS — E1129 Type 2 diabetes mellitus with other diabetic kidney complication: Secondary | ICD-10-CM | POA: Diagnosis not present

## 2017-09-28 DIAGNOSIS — N250 Renal osteodystrophy: Secondary | ICD-10-CM | POA: Diagnosis not present

## 2017-09-28 DIAGNOSIS — I10 Essential (primary) hypertension: Secondary | ICD-10-CM | POA: Diagnosis not present

## 2017-09-28 DIAGNOSIS — N183 Chronic kidney disease, stage 3 (moderate): Secondary | ICD-10-CM | POA: Diagnosis not present

## 2017-09-29 DIAGNOSIS — E119 Type 2 diabetes mellitus without complications: Secondary | ICD-10-CM | POA: Diagnosis not present

## 2017-09-29 DIAGNOSIS — I509 Heart failure, unspecified: Secondary | ICD-10-CM | POA: Diagnosis not present

## 2017-09-29 DIAGNOSIS — K5792 Diverticulitis of intestine, part unspecified, without perforation or abscess without bleeding: Secondary | ICD-10-CM | POA: Diagnosis not present

## 2017-09-29 DIAGNOSIS — J449 Chronic obstructive pulmonary disease, unspecified: Secondary | ICD-10-CM | POA: Diagnosis not present

## 2017-10-21 ENCOUNTER — Other Ambulatory Visit (HOSPITAL_COMMUNITY): Payer: Self-pay | Admitting: Internal Medicine

## 2017-10-21 DIAGNOSIS — E2839 Other primary ovarian failure: Secondary | ICD-10-CM

## 2017-10-29 DIAGNOSIS — I509 Heart failure, unspecified: Secondary | ICD-10-CM | POA: Diagnosis not present

## 2017-10-29 DIAGNOSIS — E119 Type 2 diabetes mellitus without complications: Secondary | ICD-10-CM | POA: Diagnosis not present

## 2017-10-29 DIAGNOSIS — J449 Chronic obstructive pulmonary disease, unspecified: Secondary | ICD-10-CM | POA: Diagnosis not present

## 2017-10-29 DIAGNOSIS — K5792 Diverticulitis of intestine, part unspecified, without perforation or abscess without bleeding: Secondary | ICD-10-CM | POA: Diagnosis not present

## 2017-11-15 ENCOUNTER — Ambulatory Visit: Payer: Medicare Other | Admitting: Cardiology

## 2017-11-15 DIAGNOSIS — C060 Malignant neoplasm of cheek mucosa: Secondary | ICD-10-CM | POA: Diagnosis not present

## 2017-11-15 DIAGNOSIS — C069 Malignant neoplasm of mouth, unspecified: Secondary | ICD-10-CM | POA: Diagnosis not present

## 2017-11-15 DIAGNOSIS — Z87891 Personal history of nicotine dependence: Secondary | ICD-10-CM | POA: Diagnosis not present

## 2017-11-29 DIAGNOSIS — K5792 Diverticulitis of intestine, part unspecified, without perforation or abscess without bleeding: Secondary | ICD-10-CM | POA: Diagnosis not present

## 2017-11-29 DIAGNOSIS — E119 Type 2 diabetes mellitus without complications: Secondary | ICD-10-CM | POA: Diagnosis not present

## 2017-11-29 DIAGNOSIS — I509 Heart failure, unspecified: Secondary | ICD-10-CM | POA: Diagnosis not present

## 2017-11-29 DIAGNOSIS — J449 Chronic obstructive pulmonary disease, unspecified: Secondary | ICD-10-CM | POA: Diagnosis not present

## 2017-12-21 DIAGNOSIS — H524 Presbyopia: Secondary | ICD-10-CM | POA: Diagnosis not present

## 2017-12-21 DIAGNOSIS — H26491 Other secondary cataract, right eye: Secondary | ICD-10-CM | POA: Diagnosis not present

## 2017-12-21 DIAGNOSIS — Z961 Presence of intraocular lens: Secondary | ICD-10-CM | POA: Diagnosis not present

## 2017-12-21 DIAGNOSIS — E119 Type 2 diabetes mellitus without complications: Secondary | ICD-10-CM | POA: Diagnosis not present

## 2017-12-23 DIAGNOSIS — Z933 Colostomy status: Secondary | ICD-10-CM | POA: Diagnosis not present

## 2017-12-28 ENCOUNTER — Ambulatory Visit (INDEPENDENT_AMBULATORY_CARE_PROVIDER_SITE_OTHER): Payer: Medicare Other | Admitting: Cardiology

## 2017-12-28 ENCOUNTER — Encounter: Payer: Self-pay | Admitting: Cardiology

## 2017-12-28 VITALS — BP 126/62 | HR 82 | Ht 60.0 in | Wt 174.0 lb

## 2017-12-28 DIAGNOSIS — I350 Nonrheumatic aortic (valve) stenosis: Secondary | ICD-10-CM

## 2017-12-28 DIAGNOSIS — E782 Mixed hyperlipidemia: Secondary | ICD-10-CM | POA: Diagnosis not present

## 2017-12-28 DIAGNOSIS — I10 Essential (primary) hypertension: Secondary | ICD-10-CM | POA: Diagnosis not present

## 2017-12-28 DIAGNOSIS — I2510 Atherosclerotic heart disease of native coronary artery without angina pectoris: Secondary | ICD-10-CM | POA: Diagnosis not present

## 2017-12-28 NOTE — Patient Instructions (Signed)
Medication Instructions:  Your physician recommends that you continue on your current medications as directed. Please refer to the Current Medication list given to you today.   Labwork: I WILL REQUEST LABS   Testing/Procedures: NONE  Follow-Up: Your physician wants you to follow-up in: 6 MONTHS .  You will receive a reminder letter in the mail two months in advance. If you don't receive a letter, please call our office to schedule the follow-up appointment.   Any Other Special Instructions Will Be Listed Below (If Applicable).     If you need a refill on your cardiac medications before your next appointment, please call your pharmacy.   

## 2017-12-28 NOTE — Progress Notes (Signed)
Clinical Summary Ms. Varin is a 77 y.o.female seen today for follow up of the following medical problems.   1. Nonobstructive CAD - non-obstructive CAD from cath in 2003 - 2012 nuclear stress normal - metoprolol and lisinpril stopped in the past after admission with dehdyration and hypotension    -  She reports chronic neck stiffness. Previous CT cervical spine showed severe OA.  - ongoing neck pain unchanged. - pressure midchest, mainly occurs when she gets upset. Mild pain. No other symptoms. Resolves with rest, calming down. Nonexertional.    2. Hx of CVA - on plavix for secondary prevention  3. HTN - recently started on norvasc. No ACE-I due to poor renal function - she reports pcp stopped lopressor in the past   - compliant with meds  4. HL - she is on zetia - reports recent labs with pcp  5. CKD - followed by Dr Hinda Lenis  6. Aortic stenosis - mild AS by echo in 2016    Past Medical History:  Diagnosis Date  . Anxiety   . Cancer ([place])   . Depression   . Diabetes mellitus without complication ([place])   . GERD (gastroesophageal reflux disease)   . Hyperlipidemia   . Hypertension   . Myocardial infarction   . Renal disorder    kidney failure   . Stroke [facility])    times 2     Allergies  Allergen Reactions  . Ciprofloxacin Anaphylaxis  . Aspirin Nausea And Vomiting  . Penicillins Nausea And Vomiting  . Propoxyphene N-Acetaminophen Nausea And Vomiting  . Sulfamethoxazole Rash     Current Outpatient Medications  Medication Sig Dispense Refill  . acetaminophen (TYLENOL) 500 MG tablet Take 1 tablet (500 mg total) by mouth every 6 (six) hours as needed for mild pain.    Marland Kitchen ALPRAZolam (XANAX) 0.5 MG tablet Take 1 tablet (0.5 mg total) by mouth 4 (four) times daily as needed for anxiety or sleep. 10 tablet 0  . amLODipine (NORVASC) 5 MG tablet Take 1 tablet (5 mg total) by mouth daily. 90 tablet 3  . amLODipine (NORVASC) 5 MG tablet TAKE 1  TABLET ONCE DAILY. 30 tablet 0  . cholecalciferol (VITAMIN D) 1000 units tablet Take 1,000 Units by mouth daily. 2000 units daily    . clopidogrel (PLAVIX) 75 MG tablet Take 1 tablet (75 mg total) by mouth daily. 30 tablet 6  . escitalopram (LEXAPRO) 20 MG tablet Take 20 mg by mouth daily.    Marland Kitchen ezetimibe (ZETIA) 10 MG tablet Take 10 mg by mouth daily.    . feeding supplement, RESOURCE BREEZE, (RESOURCE BREEZE) LIQD Take 1 Container by mouth 3 (three) times daily between meals.  0  . HYDROcodone-acetaminophen (NORCO) 10-325 MG per tablet Take 1 tablet by mouth every 6 (six) hours as needed for moderate pain. (Patient taking differently: Take 0.5-1 tablets by mouth every 6 (six) hours as needed for moderate pain. ) 10 tablet 0  . NITROSTAT 0.4 MG SL tablet Place 0.4 mg under the tongue every 5 (five) minutes as needed for chest pain.     Marland Kitchen ROZEREM 8 MG tablet Take 8 mg by mouth at bedtime.     No current facility-administered medications for this visit.      Past Surgical History:  Procedure Laterality Date  . ABDOMINAL HYSTERECTOMY    . CATARACT EXTRACTION W/PHACO Right 03/20/2013   Procedure: CATARACT EXTRACTION PHACO AND INTRAOCULAR LENS PLACEMENT (IOC);  Surgeon: Elta Guadeloupe T. Gershon Crane, MD;  Location: [facility];  Service: Ophthalmology;  Laterality: Right;  CDE:  6.29   . CHOLECYSTECTOMY    . COLOSTOMY    . OOPHORECTOMY    . TONSILLECTOMY       Allergies  Allergen Reactions  . Ciprofloxacin Anaphylaxis  . Aspirin Nausea And Vomiting  . Penicillins Nausea And Vomiting  . Propoxyphene N-Acetaminophen Nausea And Vomiting  . Sulfamethoxazole Rash      Family History  Problem Relation Age of Onset  . Cancer Other   . Diabetes Other   . Abnormal EKG Other   . Arthritis Other   . Asthma Other   . Kidney disease Other      Social History Ms. Deandrade reports that she quit smoking about 45 years ago. Her smoking use included cigarettes. She started smoking about 61 years ago. She has  a 3.75 pack-year smoking history. Her smokeless tobacco use includes snuff. Ms. Riederer reports that she does not drink alcohol.   Review of Systems CONSTITUTIONAL: No weight loss, fever, chills, weakness or fatigue.  HEENT: Eyes: No visual loss, blurred vision, double vision or yellow sclerae.No hearing loss, sneezing, congestion, runny nose or sore throat.  SKIN: No rash or itching.  CARDIOVASCULAR: per hpi RESPIRATORY: No shortness of breath, cough or sputum.  GASTROINTESTINAL: No anorexia, nausea, vomiting or diarrhea. No abdominal pain or blood.  GENITOURINARY: No burning on urination, no polyuria NEUROLOGICAL: No headache, dizziness, syncope, paralysis, ataxia, numbness or tingling in the extremities. No change in bowel or bladder control.  MUSCULOSKELETAL: neck pain LYMPHATICS: No enlarged nodes. No history of splenectomy.  PSYCHIATRIC: No history of depression or anxiety.  ENDOCRINOLOGIC: No reports of sweating, cold or heat intolerance. No polyuria or polydipsia.  Marland Kitchen   Physical Examination Vitals:   12/28/17 1438  BP: 126/62  Pulse: 82  SpO2: 97%   Vitals:   12/28/17 1438  Weight: 174 lb (78.9 kg)  Height: 5' (1.524 m)    Gen: resting comfortably, no acute distress HEENT: no scleral icterus, pupils equal round and reactive, no palptable cervical adenopathy,  CV" RRR, 2/6 systolic murmur rusb, no jvd Resp: Clear to auscultation bilaterally GI: abdomen is soft, non-tender, non-distended, normal bowel sounds, no hepatosplenomegaly MSK: extremities are warm, no edema.  Skin: warm, no rash Neuro:  no focal deficits Psych: appropriate affect   Diagnostic Studies Jan 2014 Echo Study Conclusions  - Left ventricle: The cavity size was normal. There was mild concentric hypertrophy. Systolic function was vigorous. The estimated ejection fraction was in the range of 65% to 70%. Wall motion was normal; there were no regional wall motion abnormalities. - Aortic  valve: Mildly to moderately calcified annulus. Mildly thickened, mildly calcified leaflets. There wastrivial, if any,stenosis. - Mitral valve: Calcified annulus. - Left atrium: The atrium was mildly dilated. - Right ventricle: The cavity size was normal. Wall thickness was moderately increased. - Atrial septum: No defect or patent foramen ovale was identified. Impressions:  - Compared to the prior study performed 08/11/11, there has been no significant interval change.  12/2013 Carotid US IMPRESSION: 1. Mild heterogeneous plaque bilaterally without evidence of stenosis or interval progression compared to 02/26/2010. 2. Vertebral arteries are patent with normal antegrade flow.   06/2002 cath PRESSURES: 1. LV: 150/0; LVEDP 18 mmHg. 2. CA: 150/80 mmHg. 3. There was no gradient across the aortic valve on catheter pullback.  ANGIOGRAPHY: Fluoroscopy did not reveal any significant coronary, intracardiac, or valvular calcification. There was a prosthetic ring in place in the area  of the stomach and distal esophagus, presumably from the patient's prior hiatal hernia surgery he had done at [facility] [place] several years ago. 1. The main left coronary was normal. 2. The left anterior descending artery had minor irregularity with less than  20-30% minimal narrowing beyond the first diagonal Keaston Pile. The distal  third of the LAD was mildly irregular but no significant stenosis or  atherosclerotic lesions with good flow to the apex where it bifurcated. 3. The first diagonal Terasa Orsini arose before SP-1 and 2 bifurcated and it was  normal. 4. The circumflex was nondominant but moderate sized. There was a small OM-  1 that was normal and moderately large OM-2 that bifurcated and was  normal, a moderate sized OM-3 that was normal. Beyond this, there was a  40-50% narrowing at the PABG Zauria Dombek which was small and bifurcated. 5. The right coronary was a  dominant vessel. There was dilatation a  centimeter after the proximal portion of the ostia. There was no ostial  or proximal stenosis and the dilatation was mild without aneurysm  formation. The remainder of the vessel was widely patent and smooth with  a small but normal PDA and PLA and normal RV branches. 6. LV angiogram in the RAO and LAO projection showed a normally contracting  ventricle which was vigorous, EF approximately 60% or greater with  angiographic LVH. There was no mitral regurgitation. 7. Abdominal aortic angiogram in the midstream PA projection showed a normal  SMA and celiac access, normal single right renal artery, and dual normal  left renal arteries. The infrarenal abdominal aorta was widely patent  with no stenosis or aneurysm formation and no significant irregularity.  The common iliacs were normal. The proximal external iliacs were normal,  mildly tortuous, and the hypogastrics were intact with good runoff  bilaterally.  DISCUSSION: This 77 year old patient of Dr. Caron Presume has a remote history of catheterization without significant coronary disease over a decade ago at [facility] but these records are not available. She has morbid obesity, a history of diabetes, past CVAs, nonsmoker with COPD and asthma, remote cholecystectomy, and remote hiatal hernia repair presumably for hiatal hernia and GI bleeding several years ago at [place] [place]. She is admitted now with chest pain felt to be compatible with ischemia. Diagnostic catheterization shows no significant coronary artery disease. There is mild dilatation in the proximal third of the RCA but no aneurysm formation and no significant stenosis. There is less than 50% lesion of the distal circumflex, good normal LV function and mild hypertension.  RECOMMENDATIONS: I would recommend medical therapy. She will probably need GI evaluation with her history of chest  pain and past GI disease.  CATHETERIZATION DIAGNOSES: 1. Chest pain etiology not determined. 2. Possible gastroesophageal reflux disease and/or esophageal spasm. 3. Remote hiatal hernia repair, [place]. 4. Remote cholecystectomy. 5. Severe morbid obesity. 6. Osteoarthritis. 7. Bilateral cataracts. 8. Adult onset diabetes mellitus. 9. Prior cerebrovascular accidents. 10. Remote hysterectomy. 11. History of palpitations.       Assessment and Plan  1. CAD -nonobstructive CAD by prior cath -ongoing atypical chest pain/neck pain - continue to monitor at this time.   2. Hyperlipidemia - intolerant to statins, on zetia.  - we will ask for labs from pcp  3. HTN -at goal, continue current meds  4. Aortic stenosis - mild by echo in 2016, will repeat echo for surveillance.   F/u 6 months        Arnoldo Lenis, M.D.

## 2017-12-29 ENCOUNTER — Encounter: Payer: Self-pay | Admitting: Cardiology

## 2017-12-30 DIAGNOSIS — E119 Type 2 diabetes mellitus without complications: Secondary | ICD-10-CM | POA: Diagnosis not present

## 2017-12-30 DIAGNOSIS — K5792 Diverticulitis of intestine, part unspecified, without perforation or abscess without bleeding: Secondary | ICD-10-CM | POA: Diagnosis not present

## 2017-12-30 DIAGNOSIS — I509 Heart failure, unspecified: Secondary | ICD-10-CM | POA: Diagnosis not present

## 2017-12-30 DIAGNOSIS — J449 Chronic obstructive pulmonary disease, unspecified: Secondary | ICD-10-CM | POA: Diagnosis not present

## 2018-01-06 ENCOUNTER — Other Ambulatory Visit (HOSPITAL_COMMUNITY): Payer: Medicare Other

## 2018-01-11 DIAGNOSIS — H26491 Other secondary cataract, right eye: Secondary | ICD-10-CM | POA: Diagnosis not present

## 2018-01-13 ENCOUNTER — Other Ambulatory Visit: Payer: Self-pay | Admitting: Cardiology

## 2018-01-25 ENCOUNTER — Ambulatory Visit (HOSPITAL_COMMUNITY)
Admission: RE | Admit: 2018-01-25 | Discharge: 2018-01-25 | Disposition: A | Discharge status: Home / Self Care | Payer: Medicare Other | Source: Ambulatory Visit | Attending: Cardiology | Admitting: Cardiology

## 2018-01-25 DIAGNOSIS — I2510 Atherosclerotic heart disease of native coronary artery without angina pectoris: Secondary | ICD-10-CM | POA: Insufficient documentation

## 2018-01-25 DIAGNOSIS — Z8673 Personal history of transient ischemic attack (TIA), and cerebral infarction without residual deficits: Secondary | ICD-10-CM | POA: Diagnosis not present

## 2018-01-25 DIAGNOSIS — D509 Iron deficiency anemia, unspecified: Secondary | ICD-10-CM | POA: Diagnosis not present

## 2018-01-25 DIAGNOSIS — Z8589 Personal history of malignant neoplasm of other organs and systems: Secondary | ICD-10-CM | POA: Diagnosis not present

## 2018-01-25 DIAGNOSIS — N184 Chronic kidney disease, stage 4 (severe): Secondary | ICD-10-CM | POA: Diagnosis not present

## 2018-01-25 DIAGNOSIS — E785 Hyperlipidemia, unspecified: Secondary | ICD-10-CM | POA: Insufficient documentation

## 2018-01-25 DIAGNOSIS — I129 Hypertensive chronic kidney disease with stage 1 through stage 4 chronic kidney disease, or unspecified chronic kidney disease: Secondary | ICD-10-CM | POA: Insufficient documentation

## 2018-01-25 DIAGNOSIS — Z79899 Other long term (current) drug therapy: Secondary | ICD-10-CM | POA: Diagnosis not present

## 2018-01-25 DIAGNOSIS — N183 Chronic kidney disease, stage 3 (moderate): Secondary | ICD-10-CM | POA: Diagnosis not present

## 2018-01-25 DIAGNOSIS — K219 Gastro-esophageal reflux disease without esophagitis: Secondary | ICD-10-CM | POA: Diagnosis not present

## 2018-01-25 DIAGNOSIS — I350 Nonrheumatic aortic (valve) stenosis: Secondary | ICD-10-CM

## 2018-01-25 DIAGNOSIS — R809 Proteinuria, unspecified: Secondary | ICD-10-CM | POA: Diagnosis not present

## 2018-01-25 DIAGNOSIS — E559 Vitamin D deficiency, unspecified: Secondary | ICD-10-CM | POA: Diagnosis not present

## 2018-01-25 NOTE — Progress Notes (Signed)
*  PRELIMINARY RESULTS* Echocardiogram 2D Echocardiogram has been performed.  [name] [name], [place] 01/25/2018, 12:10 PM

## 2018-01-27 DIAGNOSIS — K5792 Diverticulitis of intestine, part unspecified, without perforation or abscess without bleeding: Secondary | ICD-10-CM | POA: Diagnosis not present

## 2018-01-27 DIAGNOSIS — I509 Heart failure, unspecified: Secondary | ICD-10-CM | POA: Diagnosis not present

## 2018-01-27 DIAGNOSIS — J449 Chronic obstructive pulmonary disease, unspecified: Secondary | ICD-10-CM | POA: Diagnosis not present

## 2018-01-27 DIAGNOSIS — E119 Type 2 diabetes mellitus without complications: Secondary | ICD-10-CM | POA: Diagnosis not present

## 2018-01-30 DIAGNOSIS — H5213 Myopia, bilateral: Secondary | ICD-10-CM | POA: Diagnosis not present

## 2018-02-03 DIAGNOSIS — D649 Anemia, unspecified: Secondary | ICD-10-CM | POA: Diagnosis not present

## 2018-02-03 DIAGNOSIS — E1129 Type 2 diabetes mellitus with other diabetic kidney complication: Secondary | ICD-10-CM | POA: Diagnosis not present

## 2018-02-03 DIAGNOSIS — I10 Essential (primary) hypertension: Secondary | ICD-10-CM | POA: Diagnosis not present

## 2018-02-03 DIAGNOSIS — N183 Chronic kidney disease, stage 3 (moderate): Secondary | ICD-10-CM | POA: Diagnosis not present

## 2018-02-08 DIAGNOSIS — H524 Presbyopia: Secondary | ICD-10-CM | POA: Diagnosis not present

## 2018-02-14 DIAGNOSIS — Z1389 Encounter for screening for other disorder: Secondary | ICD-10-CM | POA: Diagnosis not present

## 2018-02-14 DIAGNOSIS — R5383 Other fatigue: Secondary | ICD-10-CM | POA: Diagnosis not present

## 2018-02-14 DIAGNOSIS — E782 Mixed hyperlipidemia: Secondary | ICD-10-CM | POA: Diagnosis not present

## 2018-02-14 DIAGNOSIS — G894 Chronic pain syndrome: Secondary | ICD-10-CM | POA: Diagnosis not present

## 2018-02-14 DIAGNOSIS — R6889 Other general symptoms and signs: Secondary | ICD-10-CM | POA: Diagnosis not present

## 2018-02-14 DIAGNOSIS — J449 Chronic obstructive pulmonary disease, unspecified: Secondary | ICD-10-CM | POA: Diagnosis not present

## 2018-02-14 DIAGNOSIS — N184 Chronic kidney disease, stage 4 (severe): Secondary | ICD-10-CM | POA: Diagnosis not present

## 2018-02-15 ENCOUNTER — Other Ambulatory Visit (HOSPITAL_COMMUNITY): Payer: Self-pay | Admitting: Internal Medicine

## 2018-02-15 DIAGNOSIS — E2839 Other primary ovarian failure: Secondary | ICD-10-CM

## 2018-02-23 ENCOUNTER — Ambulatory Visit (HOSPITAL_COMMUNITY)
Admission: RE | Admit: 2018-02-23 | Discharge: 2018-02-23 | Disposition: A | Discharge status: Home / Self Care | Payer: Medicare Other | Source: Ambulatory Visit | Attending: Internal Medicine | Admitting: Internal Medicine

## 2018-02-23 DIAGNOSIS — M810 Age-related osteoporosis without current pathological fracture: Secondary | ICD-10-CM | POA: Insufficient documentation

## 2018-02-23 DIAGNOSIS — E2839 Other primary ovarian failure: Secondary | ICD-10-CM | POA: Diagnosis present

## 2018-02-23 DIAGNOSIS — Z780 Asymptomatic menopausal state: Secondary | ICD-10-CM | POA: Diagnosis not present

## 2018-02-27 DIAGNOSIS — C069 Malignant neoplasm of mouth, unspecified: Secondary | ICD-10-CM | POA: Diagnosis not present

## 2018-02-27 DIAGNOSIS — K5792 Diverticulitis of intestine, part unspecified, without perforation or abscess without bleeding: Secondary | ICD-10-CM | POA: Diagnosis not present

## 2018-02-27 DIAGNOSIS — I509 Heart failure, unspecified: Secondary | ICD-10-CM | POA: Diagnosis not present

## 2018-02-27 DIAGNOSIS — J449 Chronic obstructive pulmonary disease, unspecified: Secondary | ICD-10-CM | POA: Diagnosis not present

## 2018-02-27 DIAGNOSIS — E119 Type 2 diabetes mellitus without complications: Secondary | ICD-10-CM | POA: Diagnosis not present

## 2018-03-20 DIAGNOSIS — C069 Malignant neoplasm of mouth, unspecified: Secondary | ICD-10-CM | POA: Diagnosis not present

## 2018-03-24 ENCOUNTER — Other Ambulatory Visit (HOSPITAL_COMMUNITY): Payer: Self-pay | Admitting: Internal Medicine

## 2018-03-24 DIAGNOSIS — E2839 Other primary ovarian failure: Secondary | ICD-10-CM

## 2018-03-27 DIAGNOSIS — J449 Chronic obstructive pulmonary disease, unspecified: Secondary | ICD-10-CM | POA: Diagnosis not present

## 2018-03-27 DIAGNOSIS — K219 Gastro-esophageal reflux disease without esophagitis: Secondary | ICD-10-CM | POA: Diagnosis not present

## 2018-03-27 DIAGNOSIS — K1321 Leukoplakia of oral mucosa, including tongue: Secondary | ICD-10-CM | POA: Diagnosis not present

## 2018-03-29 DIAGNOSIS — I509 Heart failure, unspecified: Secondary | ICD-10-CM | POA: Diagnosis not present

## 2018-03-29 DIAGNOSIS — E119 Type 2 diabetes mellitus without complications: Secondary | ICD-10-CM | POA: Diagnosis not present

## 2018-03-29 DIAGNOSIS — J449 Chronic obstructive pulmonary disease, unspecified: Secondary | ICD-10-CM | POA: Diagnosis not present

## 2018-03-29 DIAGNOSIS — K5792 Diverticulitis of intestine, part unspecified, without perforation or abscess without bleeding: Secondary | ICD-10-CM | POA: Diagnosis not present

## 2018-04-04 DIAGNOSIS — C069 Malignant neoplasm of mouth, unspecified: Secondary | ICD-10-CM | POA: Diagnosis not present

## 2018-04-04 DIAGNOSIS — C001 Malignant neoplasm of external lower lip: Secondary | ICD-10-CM | POA: Diagnosis not present

## 2018-04-04 DIAGNOSIS — Z720 Tobacco use: Secondary | ICD-10-CM | POA: Diagnosis not present

## 2018-04-04 DIAGNOSIS — C000 Malignant neoplasm of external upper lip: Secondary | ICD-10-CM | POA: Diagnosis not present

## 2018-04-04 DIAGNOSIS — C039 Malignant neoplasm of gum, unspecified: Secondary | ICD-10-CM | POA: Diagnosis not present

## 2018-04-04 DIAGNOSIS — C060 Malignant neoplasm of cheek mucosa: Secondary | ICD-10-CM | POA: Diagnosis not present

## 2018-04-18 DIAGNOSIS — C069 Malignant neoplasm of mouth, unspecified: Secondary | ICD-10-CM | POA: Diagnosis not present

## 2018-04-18 DIAGNOSIS — C4492 Squamous cell carcinoma of skin, unspecified: Secondary | ICD-10-CM | POA: Diagnosis not present

## 2018-04-18 DIAGNOSIS — Z720 Tobacco use: Secondary | ICD-10-CM | POA: Diagnosis not present

## 2018-04-18 DIAGNOSIS — K1321 Leukoplakia of oral mucosa, including tongue: Secondary | ICD-10-CM | POA: Diagnosis not present

## 2018-04-18 DIAGNOSIS — R42 Dizziness and giddiness: Secondary | ICD-10-CM | POA: Diagnosis not present

## 2018-04-18 DIAGNOSIS — R600 Localized edema: Secondary | ICD-10-CM | POA: Diagnosis not present

## 2018-04-18 DIAGNOSIS — N184 Chronic kidney disease, stage 4 (severe): Secondary | ICD-10-CM | POA: Diagnosis not present

## 2018-04-18 DIAGNOSIS — J449 Chronic obstructive pulmonary disease, unspecified: Secondary | ICD-10-CM | POA: Diagnosis not present

## 2018-04-27 DIAGNOSIS — Z5111 Encounter for antineoplastic chemotherapy: Secondary | ICD-10-CM | POA: Diagnosis not present

## 2018-04-27 DIAGNOSIS — C4492 Squamous cell carcinoma of skin, unspecified: Secondary | ICD-10-CM | POA: Diagnosis not present

## 2018-04-27 DIAGNOSIS — C060 Malignant neoplasm of cheek mucosa: Secondary | ICD-10-CM | POA: Diagnosis not present

## 2018-04-27 DIAGNOSIS — K1321 Leukoplakia of oral mucosa, including tongue: Secondary | ICD-10-CM | POA: Diagnosis not present

## 2018-04-27 DIAGNOSIS — R590 Localized enlarged lymph nodes: Secondary | ICD-10-CM | POA: Diagnosis not present

## 2018-04-27 DIAGNOSIS — Z720 Tobacco use: Secondary | ICD-10-CM | POA: Diagnosis not present

## 2018-04-27 DIAGNOSIS — Z79899 Other long term (current) drug therapy: Secondary | ICD-10-CM | POA: Diagnosis not present

## 2018-04-29 DIAGNOSIS — K5792 Diverticulitis of intestine, part unspecified, without perforation or abscess without bleeding: Secondary | ICD-10-CM | POA: Diagnosis not present

## 2018-04-29 DIAGNOSIS — E119 Type 2 diabetes mellitus without complications: Secondary | ICD-10-CM | POA: Diagnosis not present

## 2018-04-29 DIAGNOSIS — J449 Chronic obstructive pulmonary disease, unspecified: Secondary | ICD-10-CM | POA: Diagnosis not present

## 2018-04-29 DIAGNOSIS — I509 Heart failure, unspecified: Secondary | ICD-10-CM | POA: Diagnosis not present

## 2018-04-29 DIAGNOSIS — C4492 Squamous cell carcinoma of skin, unspecified: Secondary | ICD-10-CM | POA: Diagnosis not present

## 2018-05-15 DIAGNOSIS — E1129 Type 2 diabetes mellitus with other diabetic kidney complication: Secondary | ICD-10-CM | POA: Diagnosis not present

## 2018-05-15 DIAGNOSIS — G894 Chronic pain syndrome: Secondary | ICD-10-CM | POA: Diagnosis not present

## 2018-05-15 DIAGNOSIS — C069 Malignant neoplasm of mouth, unspecified: Secondary | ICD-10-CM | POA: Diagnosis not present

## 2018-05-15 DIAGNOSIS — Z0001 Encounter for general adult medical examination with abnormal findings: Secondary | ICD-10-CM | POA: Diagnosis not present

## 2018-05-15 DIAGNOSIS — Z1389 Encounter for screening for other disorder: Secondary | ICD-10-CM | POA: Diagnosis not present

## 2018-05-15 DIAGNOSIS — I10 Essential (primary) hypertension: Secondary | ICD-10-CM | POA: Diagnosis not present

## 2018-05-15 DIAGNOSIS — Z0000 Encounter for general adult medical examination without abnormal findings: Secondary | ICD-10-CM | POA: Diagnosis not present

## 2018-05-15 DIAGNOSIS — Z13220 Encounter for screening for lipoid disorders: Secondary | ICD-10-CM | POA: Diagnosis not present

## 2018-05-18 DIAGNOSIS — C060 Malignant neoplasm of cheek mucosa: Secondary | ICD-10-CM | POA: Diagnosis not present

## 2018-05-18 DIAGNOSIS — Z5111 Encounter for antineoplastic chemotherapy: Secondary | ICD-10-CM | POA: Diagnosis not present

## 2018-05-18 DIAGNOSIS — Z79899 Other long term (current) drug therapy: Secondary | ICD-10-CM | POA: Diagnosis not present

## 2018-05-29 DIAGNOSIS — K5792 Diverticulitis of intestine, part unspecified, without perforation or abscess without bleeding: Secondary | ICD-10-CM | POA: Diagnosis not present

## 2018-05-29 DIAGNOSIS — J449 Chronic obstructive pulmonary disease, unspecified: Secondary | ICD-10-CM | POA: Diagnosis not present

## 2018-05-29 DIAGNOSIS — I509 Heart failure, unspecified: Secondary | ICD-10-CM | POA: Diagnosis not present

## 2018-05-29 DIAGNOSIS — E119 Type 2 diabetes mellitus without complications: Secondary | ICD-10-CM | POA: Diagnosis not present

## 2018-06-05 DIAGNOSIS — Z933 Colostomy status: Secondary | ICD-10-CM | POA: Diagnosis not present

## 2018-06-08 DIAGNOSIS — C0689 Malignant neoplasm of overlapping sites of other parts of mouth: Secondary | ICD-10-CM | POA: Diagnosis not present

## 2018-06-08 DIAGNOSIS — Z79899 Other long term (current) drug therapy: Secondary | ICD-10-CM | POA: Diagnosis not present

## 2018-06-08 DIAGNOSIS — C4492 Squamous cell carcinoma of skin, unspecified: Secondary | ICD-10-CM | POA: Diagnosis not present

## 2018-06-08 DIAGNOSIS — Z5112 Encounter for antineoplastic immunotherapy: Secondary | ICD-10-CM | POA: Diagnosis not present

## 2018-06-29 DIAGNOSIS — E119 Type 2 diabetes mellitus without complications: Secondary | ICD-10-CM | POA: Diagnosis not present

## 2018-06-29 DIAGNOSIS — N189 Chronic kidney disease, unspecified: Secondary | ICD-10-CM | POA: Diagnosis not present

## 2018-06-29 DIAGNOSIS — J449 Chronic obstructive pulmonary disease, unspecified: Secondary | ICD-10-CM | POA: Diagnosis not present

## 2018-06-29 DIAGNOSIS — C0689 Malignant neoplasm of overlapping sites of other parts of mouth: Secondary | ICD-10-CM | POA: Diagnosis not present

## 2018-06-29 DIAGNOSIS — Z5112 Encounter for antineoplastic immunotherapy: Secondary | ICD-10-CM | POA: Diagnosis not present

## 2018-06-29 DIAGNOSIS — I509 Heart failure, unspecified: Secondary | ICD-10-CM | POA: Diagnosis not present

## 2018-06-29 DIAGNOSIS — Z79899 Other long term (current) drug therapy: Secondary | ICD-10-CM | POA: Diagnosis not present

## 2018-06-29 DIAGNOSIS — C4492 Squamous cell carcinoma of skin, unspecified: Secondary | ICD-10-CM | POA: Diagnosis not present

## 2018-06-29 DIAGNOSIS — K5792 Diverticulitis of intestine, part unspecified, without perforation or abscess without bleeding: Secondary | ICD-10-CM | POA: Diagnosis not present

## 2018-07-03 ENCOUNTER — Ambulatory Visit: Payer: Medicare Other | Admitting: Cardiology

## 2018-07-03 NOTE — Progress Notes (Deleted)
Clinical Summary Ms. Barlett is a 77 y.o.female  seen today for follow up of the following medical problems.   1. Nonobstructive CAD - non-obstructive CAD from cath in 2003 - 2012 nuclear stress normal - metoprolol and lisinpril stopped in the past after admission with dehdyration and hypotension   -  She reports chronic neck stiffness.Previous CT cervical spine showed severe OA. - ongoing neck pain unchanged. - pressure midchest, mainly occurs when she gets upset. Mild pain. No other symptoms. Resolves with rest, calming down. Nonexertional.    2. Hx of CVA - on plavix for secondary prevention  3. HTN - recently started on norvasc. No ACE-I due to poor renal function - she reports pcp stopped lopressorin the past  - compliant with meds  4. HL - she is on zetia - reports recent labs with pcp  5. CKD - followed by Dr Hinda Lenis  6. Aortic stenosis - mild AS by echo in 2016   Past Medical History:  Diagnosis Date  . Anxiety   . Cancer ([place])   . Depression   . Diabetes mellitus without complication ([place])   . GERD (gastroesophageal reflux disease)   . Hyperlipidemia   . Hypertension   . Myocardial infarction ([place])   . Renal disorder    kidney failure   . Stroke [facility])    times 2     Allergies  Allergen Reactions  . Ciprofloxacin Anaphylaxis  . Aspirin Nausea And Vomiting  . Penicillins Nausea And Vomiting  . Propoxyphene N-Acetaminophen Nausea And Vomiting  . Sulfamethoxazole Rash     Current Outpatient Medications  Medication Sig Dispense Refill  . acetaminophen (TYLENOL) 500 MG tablet Take 1 tablet (500 mg total) by mouth every 6 (six) hours as needed for mild pain.    Marland Kitchen ALPRAZolam (XANAX) 0.5 MG tablet Take 1 tablet (0.5 mg total) by mouth 4 (four) times daily as needed for anxiety or sleep. 10 tablet 0  . amLODipine (NORVASC) 5 MG tablet TAKE 1 TABLET ONCE DAILY. 30 tablet 11  . cholecalciferol (VITAMIN D) 1000 units tablet Take  1,000 Units by mouth daily. 2000 units daily    . clopidogrel (PLAVIX) 75 MG tablet Take 1 tablet (75 mg total) by mouth daily. 30 tablet 6  . escitalopram (LEXAPRO) 20 MG tablet Take 20 mg by mouth daily.    Marland Kitchen ezetimibe (ZETIA) 10 MG tablet Take 10 mg by mouth daily.    . feeding supplement, RESOURCE BREEZE, (RESOURCE BREEZE) LIQD Take 1 Container by mouth 3 (three) times daily between meals.  0  . HYDROcodone-acetaminophen (NORCO) 10-325 MG per tablet Take 1 tablet by mouth every 6 (six) hours as needed for moderate pain. (Patient taking differently: Take 0.5-1 tablets by mouth every 6 (six) hours as needed for moderate pain. ) 10 tablet 0  . NITROSTAT 0.4 MG SL tablet Place 0.4 mg under the tongue every 5 (five) minutes as needed for chest pain.     Marland Kitchen ROZEREM 8 MG tablet Take 8 mg by mouth at bedtime.     No current facility-administered medications for this visit.      Past Surgical History:  Procedure Laterality Date  . ABDOMINAL HYSTERECTOMY    . CATARACT EXTRACTION W/PHACO Right 03/20/2013   Procedure: CATARACT EXTRACTION PHACO AND INTRAOCULAR LENS PLACEMENT (IOC);  Surgeon: Elta Guadeloupe T. Gershon Crane, MD;  Location: [facility];  Service: Ophthalmology;  Laterality: Right;  CDE:  6.29   . CHOLECYSTECTOMY    . COLOSTOMY    .  OOPHORECTOMY    . TONSILLECTOMY       Allergies  Allergen Reactions  . Ciprofloxacin Anaphylaxis  . Aspirin Nausea And Vomiting  . Penicillins Nausea And Vomiting  . Propoxyphene N-Acetaminophen Nausea And Vomiting  . Sulfamethoxazole Rash      Family History  Problem Relation Age of Onset  . Cancer Other   . Diabetes Other   . Abnormal EKG Other   . Arthritis Other   . Asthma Other   . Kidney disease Other      Social History Ms. Mathias reports that she quit smoking about 45 years ago. Her smoking use included cigarettes. She started smoking about 61 years ago. She has a 3.75 pack-year smoking history. Her smokeless tobacco use includes snuff. Ms.  Mulgrew reports that she does not drink alcohol.   Review of Systems CONSTITUTIONAL: No weight loss, fever, chills, weakness or fatigue.  HEENT: Eyes: No visual loss, blurred vision, double vision or yellow sclerae.No hearing loss, sneezing, congestion, runny nose or sore throat.  SKIN: No rash or itching.  CARDIOVASCULAR:  RESPIRATORY: No shortness of breath, cough or sputum.  GASTROINTESTINAL: No anorexia, nausea, vomiting or diarrhea. No abdominal pain or blood.  GENITOURINARY: No burning on urination, no polyuria NEUROLOGICAL: No headache, dizziness, syncope, paralysis, ataxia, numbness or tingling in the extremities. No change in bowel or bladder control.  MUSCULOSKELETAL: No muscle, back pain, joint pain or stiffness.  LYMPHATICS: No enlarged nodes. No history of splenectomy.  PSYCHIATRIC: No history of depression or anxiety.  ENDOCRINOLOGIC: No reports of sweating, cold or heat intolerance. No polyuria or polydipsia.  Marland Kitchen   Physical Examination There were no vitals filed for this visit. There were no vitals filed for this visit.  Gen: resting comfortably, no acute distress HEENT: no scleral icterus, pupils equal round and reactive, no palptable cervical adenopathy,  CV Resp: Clear to auscultation bilaterally GI: abdomen is soft, non-tender, non-distended, normal bowel sounds, no hepatosplenomegaly MSK: extremities are warm, no edema.  Skin: warm, no rash Neuro:  no focal deficits Psych: appropriate affect   Diagnostic Studies Jan 2014 Echo Study Conclusions  - Left ventricle: The cavity size was normal. There was mild concentric hypertrophy. Systolic function was vigorous. The estimated ejection fraction was in the range of 65% to 70%. Wall motion was normal; there were no regional wall motion abnormalities. - Aortic valve: Mildly to moderately calcified annulus. Mildly thickened, mildly calcified leaflets. There wastrivial, if any,stenosis. - Mitral  valve: Calcified annulus. - Left atrium: The atrium was mildly dilated. - Right ventricle: The cavity size was normal. Wall thickness was moderately increased. - Atrial septum: No defect or patent foramen ovale was identified. Impressions:  - Compared to the prior study performed 08/11/11, there has been no significant interval change.  12/2013 Carotid US IMPRESSION: 1. Mild heterogeneous plaque bilaterally without evidence of stenosis or interval progression compared to 02/26/2010. 2. Vertebral arteries are patent with normal antegrade flow.   06/2002 cath PRESSURES: 1. LV: 150/0; LVEDP 18 mmHg. 2. CA: 150/80 mmHg. 3. There was no gradient across the aortic valve on catheter pullback.  ANGIOGRAPHY: Fluoroscopy did not reveal any significant coronary, intracardiac, or valvular calcification. There was a prosthetic ring in place in the area of the stomach and distal esophagus, presumably from the patient's prior hiatal hernia surgery he had done at [facility]. [place] several years ago. 1. The main left coronary was normal. 2. The left anterior descending artery had minor irregularity with less than  20-30% minimal  narrowing beyond the first diagonal Skylier Kretschmer. The distal  third of the LAD was mildly irregular but no significant stenosis or  atherosclerotic lesions with good flow to the apex where it bifurcated. 3. The first diagonal Lacara Dunsworth arose before SP-1 and 2 bifurcated and it was  normal. 4. The circumflex was nondominant but moderate sized. There was a small OM-  1 that was normal and moderately large OM-2 that bifurcated and was  normal, a moderate sized OM-3 that was normal. Beyond this, there was a  40-50% narrowing at the PABG Jahmar Mckelvy which was small and bifurcated. 5. The right coronary was a dominant vessel. There was dilatation a  centimeter after the proximal portion of the ostia. There was no ostial  or proximal  stenosis and the dilatation was mild without aneurysm  formation. The remainder of the vessel was widely patent and smooth with  a small but normal PDA and PLA and normal RV branches. 6. LV angiogram in the RAO and LAO projection showed a normally contracting  ventricle which was vigorous, EF approximately 60% or greater with  angiographic LVH. There was no mitral regurgitation. 7. Abdominal aortic angiogram in the midstream PA projection showed a normal  SMA and celiac access, normal single right renal artery, and dual normal  left renal arteries. The infrarenal abdominal aorta was widely patent  with no stenosis or aneurysm formation and no significant irregularity.  The common iliacs were normal. The proximal external iliacs were normal,  mildly tortuous, and the hypogastrics were intact with good runoff  bilaterally.  DISCUSSION: This 77 year old patient of Dr. Caron Presume has a remote history of catheterization without significant coronary disease over a decade ago at [facility] but these records are not available. She has morbid obesity, a history of diabetes, past CVAs, nonsmoker with COPD and asthma, remote cholecystectomy, and remote hiatal hernia repair presumably for hiatal hernia and GI bleeding several years ago at [place] [place]. She is admitted now with chest pain felt to be compatible with ischemia. Diagnostic catheterization shows no significant coronary artery disease. There is mild dilatation in the proximal third of the RCA but no aneurysm formation and no significant stenosis. There is less than 50% lesion of the distal circumflex, good normal LV function and mild hypertension.  RECOMMENDATIONS: I would recommend medical therapy. She will probably need GI evaluation with her history of chest pain and past GI disease.  CATHETERIZATION DIAGNOSES: 1. Chest pain etiology not determined. 2. Possible gastroesophageal reflux  disease and/or esophageal spasm. 3. Remote hiatal hernia repair, [place]. 4. Remote cholecystectomy. 5. Severe morbid obesity. 6. Osteoarthritis. 7. Bilateral cataracts. 8. Adult onset diabetes mellitus. 9. Prior cerebrovascular accidents. 10. Remote hysterectomy. 11. History of palpitations.      Assessment and Plan  1. CAD -nonobstructive CAD by prior cath -ongoing atypical chest pain/neck pain - continue to monitor at this time.   2. Hyperlipidemia - intolerant to statins, on zetia. - we will ask for labs from pcp  3. HTN -at goal, continue current meds  4. Aortic stenosis - mild by echo in 2016, will repeat echo for surveillance.   F/u 6 months      Arnoldo Lenis, M.D., F.A.C.C.

## 2018-07-04 ENCOUNTER — Encounter: Payer: Self-pay | Admitting: Cardiology

## 2018-07-13 DIAGNOSIS — N184 Chronic kidney disease, stage 4 (severe): Secondary | ICD-10-CM | POA: Diagnosis not present

## 2018-07-13 DIAGNOSIS — K219 Gastro-esophageal reflux disease without esophagitis: Secondary | ICD-10-CM | POA: Diagnosis not present

## 2018-07-13 DIAGNOSIS — Z933 Colostomy status: Secondary | ICD-10-CM | POA: Diagnosis not present

## 2018-07-13 DIAGNOSIS — I129 Hypertensive chronic kidney disease with stage 1 through stage 4 chronic kidney disease, or unspecified chronic kidney disease: Secondary | ICD-10-CM | POA: Diagnosis not present

## 2018-07-13 DIAGNOSIS — I2510 Atherosclerotic heart disease of native coronary artery without angina pectoris: Secondary | ICD-10-CM | POA: Diagnosis not present

## 2018-07-13 DIAGNOSIS — J449 Chronic obstructive pulmonary disease, unspecified: Secondary | ICD-10-CM | POA: Diagnosis not present

## 2018-07-13 DIAGNOSIS — C0689 Malignant neoplasm of overlapping sites of other parts of mouth: Secondary | ICD-10-CM | POA: Diagnosis not present

## 2018-07-13 DIAGNOSIS — Z8673 Personal history of transient ischemic attack (TIA), and cerebral infarction without residual deficits: Secondary | ICD-10-CM | POA: Diagnosis not present

## 2018-07-13 DIAGNOSIS — R197 Diarrhea, unspecified: Secondary | ICD-10-CM | POA: Diagnosis not present

## 2018-07-13 DIAGNOSIS — C4492 Squamous cell carcinoma of skin, unspecified: Secondary | ICD-10-CM | POA: Diagnosis not present

## 2018-07-20 DIAGNOSIS — Z5111 Encounter for antineoplastic chemotherapy: Secondary | ICD-10-CM | POA: Diagnosis not present

## 2018-07-20 DIAGNOSIS — C0689 Malignant neoplasm of overlapping sites of other parts of mouth: Secondary | ICD-10-CM | POA: Diagnosis not present

## 2018-07-20 DIAGNOSIS — K1321 Leukoplakia of oral mucosa, including tongue: Secondary | ICD-10-CM | POA: Diagnosis not present

## 2018-07-20 DIAGNOSIS — Z79899 Other long term (current) drug therapy: Secondary | ICD-10-CM | POA: Diagnosis not present

## 2018-07-30 DIAGNOSIS — J449 Chronic obstructive pulmonary disease, unspecified: Secondary | ICD-10-CM | POA: Diagnosis not present

## 2018-07-30 DIAGNOSIS — E119 Type 2 diabetes mellitus without complications: Secondary | ICD-10-CM | POA: Diagnosis not present

## 2018-07-30 DIAGNOSIS — I509 Heart failure, unspecified: Secondary | ICD-10-CM | POA: Diagnosis not present

## 2018-07-30 DIAGNOSIS — K5792 Diverticulitis of intestine, part unspecified, without perforation or abscess without bleeding: Secondary | ICD-10-CM | POA: Diagnosis not present

## 2018-08-07 DIAGNOSIS — D4989 Neoplasm of unspecified behavior of other specified sites: Secondary | ICD-10-CM | POA: Diagnosis not present

## 2018-08-07 DIAGNOSIS — G894 Chronic pain syndrome: Secondary | ICD-10-CM | POA: Diagnosis not present

## 2018-08-07 DIAGNOSIS — Z23 Encounter for immunization: Secondary | ICD-10-CM | POA: Diagnosis not present

## 2018-08-07 DIAGNOSIS — Z1389 Encounter for screening for other disorder: Secondary | ICD-10-CM | POA: Diagnosis not present

## 2018-08-10 DIAGNOSIS — W19XXXA Unspecified fall, initial encounter: Secondary | ICD-10-CM | POA: Diagnosis not present

## 2018-08-10 DIAGNOSIS — Z8673 Personal history of transient ischemic attack (TIA), and cerebral infarction without residual deficits: Secondary | ICD-10-CM | POA: Diagnosis not present

## 2018-08-10 DIAGNOSIS — C039 Malignant neoplasm of gum, unspecified: Secondary | ICD-10-CM | POA: Diagnosis not present

## 2018-08-10 DIAGNOSIS — N179 Acute kidney failure, unspecified: Secondary | ICD-10-CM | POA: Diagnosis not present

## 2018-08-10 DIAGNOSIS — M25562 Pain in left knee: Secondary | ICD-10-CM | POA: Diagnosis not present

## 2018-08-10 DIAGNOSIS — N184 Chronic kidney disease, stage 4 (severe): Secondary | ICD-10-CM | POA: Diagnosis not present

## 2018-08-10 DIAGNOSIS — D72829 Elevated white blood cell count, unspecified: Secondary | ICD-10-CM | POA: Diagnosis not present

## 2018-08-10 DIAGNOSIS — Z8249 Family history of ischemic heart disease and other diseases of the circulatory system: Secondary | ICD-10-CM | POA: Diagnosis not present

## 2018-08-10 DIAGNOSIS — C44329 Squamous cell carcinoma of skin of other parts of face: Secondary | ICD-10-CM | POA: Diagnosis not present

## 2018-08-10 DIAGNOSIS — T451X5A Adverse effect of antineoplastic and immunosuppressive drugs, initial encounter: Secondary | ICD-10-CM | POA: Diagnosis not present

## 2018-08-10 DIAGNOSIS — I252 Old myocardial infarction: Secondary | ICD-10-CM | POA: Diagnosis not present

## 2018-08-10 DIAGNOSIS — E440 Moderate protein-calorie malnutrition: Secondary | ICD-10-CM | POA: Diagnosis not present

## 2018-08-10 DIAGNOSIS — Z720 Tobacco use: Secondary | ICD-10-CM | POA: Diagnosis not present

## 2018-08-10 DIAGNOSIS — C0689 Malignant neoplasm of overlapping sites of other parts of mouth: Secondary | ICD-10-CM | POA: Diagnosis not present

## 2018-08-10 DIAGNOSIS — G893 Neoplasm related pain (acute) (chronic): Secondary | ICD-10-CM | POA: Diagnosis not present

## 2018-08-10 DIAGNOSIS — I129 Hypertensive chronic kidney disease with stage 1 through stage 4 chronic kidney disease, or unspecified chronic kidney disease: Secondary | ICD-10-CM | POA: Diagnosis not present

## 2018-08-10 DIAGNOSIS — Z933 Colostomy status: Secondary | ICD-10-CM | POA: Diagnosis not present

## 2018-08-10 DIAGNOSIS — C4492 Squamous cell carcinoma of skin, unspecified: Secondary | ICD-10-CM | POA: Diagnosis not present

## 2018-08-10 DIAGNOSIS — J449 Chronic obstructive pulmonary disease, unspecified: Secondary | ICD-10-CM | POA: Diagnosis not present

## 2018-08-10 DIAGNOSIS — Y92009 Unspecified place in unspecified non-institutional (private) residence as the place of occurrence of the external cause: Secondary | ICD-10-CM | POA: Diagnosis not present

## 2018-08-10 DIAGNOSIS — M25561 Pain in right knee: Secondary | ICD-10-CM | POA: Diagnosis not present

## 2018-08-10 DIAGNOSIS — M25461 Effusion, right knee: Secondary | ICD-10-CM | POA: Diagnosis not present

## 2018-08-10 DIAGNOSIS — K219 Gastro-esophageal reflux disease without esophagitis: Secondary | ICD-10-CM | POA: Diagnosis not present

## 2018-08-10 DIAGNOSIS — E041 Nontoxic single thyroid nodule: Secondary | ICD-10-CM | POA: Diagnosis not present

## 2018-08-10 DIAGNOSIS — C4482 Squamous cell carcinoma of overlapping sites of skin: Secondary | ICD-10-CM | POA: Diagnosis not present

## 2018-08-10 DIAGNOSIS — E875 Hyperkalemia: Secondary | ICD-10-CM | POA: Diagnosis not present

## 2018-08-10 DIAGNOSIS — M25552 Pain in left hip: Secondary | ICD-10-CM | POA: Diagnosis not present

## 2018-08-10 DIAGNOSIS — I96 Gangrene, not elsewhere classified: Secondary | ICD-10-CM | POA: Diagnosis not present

## 2018-08-10 DIAGNOSIS — N189 Chronic kidney disease, unspecified: Secondary | ICD-10-CM | POA: Diagnosis not present

## 2018-08-10 DIAGNOSIS — I2510 Atherosclerotic heart disease of native coronary artery without angina pectoris: Secondary | ICD-10-CM | POA: Diagnosis not present

## 2018-08-17 ENCOUNTER — Ambulatory Visit: Payer: Medicare Other | Admitting: Cardiology

## 2018-08-17 DIAGNOSIS — M25512 Pain in left shoulder: Secondary | ICD-10-CM | POA: Diagnosis not present

## 2018-08-17 DIAGNOSIS — E440 Moderate protein-calorie malnutrition: Secondary | ICD-10-CM | POA: Diagnosis not present

## 2018-08-17 DIAGNOSIS — C069 Malignant neoplasm of mouth, unspecified: Secondary | ICD-10-CM | POA: Diagnosis not present

## 2018-08-17 DIAGNOSIS — Z7902 Long term (current) use of antithrombotics/antiplatelets: Secondary | ICD-10-CM | POA: Diagnosis not present

## 2018-08-17 DIAGNOSIS — I2510 Atherosclerotic heart disease of native coronary artery without angina pectoris: Secondary | ICD-10-CM | POA: Diagnosis not present

## 2018-08-17 DIAGNOSIS — R531 Weakness: Secondary | ICD-10-CM | POA: Diagnosis not present

## 2018-08-17 DIAGNOSIS — I129 Hypertensive chronic kidney disease with stage 1 through stage 4 chronic kidney disease, or unspecified chronic kidney disease: Secondary | ICD-10-CM | POA: Diagnosis not present

## 2018-08-17 DIAGNOSIS — Z79891 Long term (current) use of opiate analgesic: Secondary | ICD-10-CM | POA: Diagnosis not present

## 2018-08-17 DIAGNOSIS — J449 Chronic obstructive pulmonary disease, unspecified: Secondary | ICD-10-CM | POA: Diagnosis not present

## 2018-08-17 DIAGNOSIS — W19XXXD Unspecified fall, subsequent encounter: Secondary | ICD-10-CM | POA: Diagnosis not present

## 2018-08-17 DIAGNOSIS — N184 Chronic kidney disease, stage 4 (severe): Secondary | ICD-10-CM | POA: Diagnosis not present

## 2018-08-17 DIAGNOSIS — D72829 Elevated white blood cell count, unspecified: Secondary | ICD-10-CM | POA: Diagnosis not present

## 2018-08-17 DIAGNOSIS — Z8673 Personal history of transient ischemic attack (TIA), and cerebral infarction without residual deficits: Secondary | ICD-10-CM | POA: Diagnosis not present

## 2018-08-17 DIAGNOSIS — M25561 Pain in right knee: Secondary | ICD-10-CM | POA: Diagnosis not present

## 2018-08-17 DIAGNOSIS — E1122 Type 2 diabetes mellitus with diabetic chronic kidney disease: Secondary | ICD-10-CM | POA: Diagnosis not present

## 2018-08-17 NOTE — Progress Notes (Deleted)
Clinical Summary [name] [name] is a 77 y.o.female seen today for follow up of the following medical problems.   1. Nonobstructive CAD - non-obstructive CAD from cath in 2003 - 2012 nuclear stress normal - metoprolol and lisinpril stopped in the past after admission with dehdyration and hypotension   -  She reports chronic neck stiffness.Previous CT cervical spine showed severe OA. - ongoing neck pain unchanged. - pressure midchest, mainly occurs when she gets upset. Mild pain. No other symptoms. Resolves with rest, calming down. Nonexertional.    2. Hx of CVA - on plavix for secondary prevention  3. HTN - recently started on norvasc. No ACE-I due to poor renal function - she reports pcp stopped lopressorin the past  - compliant with meds  4. HL - she is on zetia - reports recent labs with pcp  5. CKD - followed by Dr Hinda Lenis  6. Aortic stenosis - mild AS by echo in 2016  01/2018 echo LVEF 55-60%, mild AS mean grad 10.   7. 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 Notes indicate she is on pembrloziumab starting 04/2018.    Past Medical History:  Diagnosis Date  . Anxiety   . Cancer ([place])   . Depression   . Diabetes mellitus without complication ([place])   . GERD (gastroesophageal reflux disease)   . Hyperlipidemia   . Hypertension   . Myocardial infarction ([place])   . Renal disorder    kidney failure   . Stroke [facility])    times 2     Allergies  Allergen Reactions  . Ciprofloxacin Anaphylaxis  . Aspirin Nausea And Vomiting  . Penicillins Nausea And Vomiting  . Propoxyphene N-Acetaminophen Nausea And Vomiting  . Sulfamethoxazole Rash     Current Outpatient Medications  Medication Sig Dispense Refill  . acetaminophen (TYLENOL) 500 MG tablet Take 1 tablet (500 mg total) by mouth every 6 (six) hours as needed for mild pain.    Marland Kitchen ALPRAZolam (XANAX) 0.5 MG tablet Take 1 tablet (0.5 mg total) by mouth 4 (four) times daily as needed for anxiety or  sleep. 10 tablet 0  . amLODipine (NORVASC) 5 MG tablet TAKE 1 TABLET ONCE DAILY. 30 tablet 11  . cholecalciferol (VITAMIN D) 1000 units tablet Take 1,000 Units by mouth daily. 2000 units daily    . clopidogrel (PLAVIX) 75 MG tablet Take 1 tablet (75 mg total) by mouth daily. 30 tablet 6  . escitalopram (LEXAPRO) 20 MG tablet Take 20 mg by mouth daily.    Marland Kitchen ezetimibe (ZETIA) 10 MG tablet Take 10 mg by mouth daily.    . feeding supplement, RESOURCE BREEZE, (RESOURCE BREEZE) LIQD Take 1 Container by mouth 3 (three) times daily between meals.  0  . HYDROcodone-acetaminophen (NORCO) 10-325 MG per tablet Take 1 tablet by mouth every 6 (six) hours as needed for moderate pain. (Patient taking differently: Take 0.5-1 tablets by mouth every 6 (six) hours as needed for moderate pain. ) 10 tablet 0  . NITROSTAT 0.4 MG SL tablet Place 0.4 mg under the tongue every 5 (five) minutes as needed for chest pain.     Marland Kitchen ROZEREM 8 MG tablet Take 8 mg by mouth at bedtime.     No current facility-administered medications for this visit.      Past Surgical History:  Procedure Laterality Date  . ABDOMINAL HYSTERECTOMY    . CATARACT EXTRACTION W/PHACO Right 03/20/2013   Procedure: CATARACT EXTRACTION PHACO AND INTRAOCULAR LENS PLACEMENT (IOC);  Surgeon: Elta Guadeloupe T. Gershon Crane, MD;  Location: [facility];  Service: Ophthalmology;  Laterality: Right;  CDE:  6.29   . CHOLECYSTECTOMY    . COLOSTOMY    . OOPHORECTOMY    . TONSILLECTOMY       Allergies  Allergen Reactions  . Ciprofloxacin Anaphylaxis  . Aspirin Nausea And Vomiting  . Penicillins Nausea And Vomiting  . Propoxyphene N-Acetaminophen Nausea And Vomiting  . Sulfamethoxazole Rash      Family History  Problem Relation Age of Onset  . Cancer Other   . Diabetes Other   . Abnormal EKG Other   . Arthritis Other   . Asthma Other   . Kidney disease Other      Social History Ms. Bou reports that she quit smoking about 46 years ago. Her smoking use  included cigarettes. She started smoking about 61 years ago. She has a 3.75 pack-year smoking history. Her smokeless tobacco use includes snuff. Ms. Houseworth reports that she does not drink alcohol.   Review of Systems CONSTITUTIONAL: No weight loss, fever, chills, weakness or fatigue.  HEENT: Eyes: No visual loss, blurred vision, double vision or yellow sclerae.No hearing loss, sneezing, congestion, runny nose or sore throat.  SKIN: No rash or itching.  CARDIOVASCULAR:  RESPIRATORY: No shortness of breath, cough or sputum.  GASTROINTESTINAL: No anorexia, nausea, vomiting or diarrhea. No abdominal pain or blood.  GENITOURINARY: No burning on urination, no polyuria NEUROLOGICAL: No headache, dizziness, syncope, paralysis, ataxia, numbness or tingling in the extremities. No change in bowel or bladder control.  MUSCULOSKELETAL: No muscle, back pain, joint pain or stiffness.  LYMPHATICS: No enlarged nodes. No history of splenectomy.  PSYCHIATRIC: No history of depression or anxiety.  ENDOCRINOLOGIC: No reports of sweating, cold or heat intolerance. No polyuria or polydipsia.  Marland Kitchen   Physical Examination There were no vitals filed for this visit. There were no vitals filed for this visit.  Gen: resting comfortably, no acute distress HEENT: no scleral icterus, pupils equal round and reactive, no palptable cervical adenopathy,  CV Resp: Clear to auscultation bilaterally GI: abdomen is soft, non-tender, non-distended, normal bowel sounds, no hepatosplenomegaly MSK: extremities are warm, no edema.  Skin: warm, no rash Neuro:  no focal deficits Psych: appropriate affect   Diagnostic Studies Jan 2014 Echo Study Conclusions  - Left ventricle: The cavity size was normal. There was mild concentric hypertrophy. Systolic function was vigorous. The estimated ejection fraction was in the range of 65% to 70%. Wall motion was normal; there were no regional wall motion abnormalities. -  Aortic valve: Mildly to moderately calcified annulus. Mildly thickened, mildly calcified leaflets. There wastrivial, if any,stenosis. - Mitral valve: Calcified annulus. - Left atrium: The atrium was mildly dilated. - Right ventricle: The cavity size was normal. Wall thickness was moderately increased. - Atrial septum: No defect or patent foramen ovale was identified. Impressions:  - Compared to the prior study performed 08/11/11, there has been no significant interval change.  12/2013 Carotid US IMPRESSION: 1. Mild heterogeneous plaque bilaterally without evidence of stenosis or interval progression compared to 02/26/2010. 2. Vertebral arteries are patent with normal antegrade flow.   06/2002 cath PRESSURES: 1. LV: 150/0; LVEDP 18 mmHg. 2. CA: 150/80 mmHg. 3. There was no gradient across the aortic valve on catheter pullback.  ANGIOGRAPHY: Fluoroscopy did not reveal any significant coronary, intracardiac, or valvular calcification. There was a prosthetic ring in place in the area of the stomach and distal esophagus, presumably from the patient's prior hiatal hernia  surgery he had done at [facility] [place] several years ago. 1. The main left coronary was normal. 2. The left anterior descending artery had minor irregularity with less than  20-30% minimal narrowing beyond the first diagonal Keyon Winnick. The distal  third of the LAD was mildly irregular but no significant stenosis or  atherosclerotic lesions with good flow to the apex where it bifurcated. 3. The first diagonal Dylana Shaw arose before SP-1 and 2 bifurcated and it was  normal. 4. The circumflex was nondominant but moderate sized. There was a small OM-  1 that was normal and moderately large OM-2 that bifurcated and was  normal, a moderate sized OM-3 that was normal. Beyond this, there was a  40-50% narrowing at the PABG Kazuki Ingle which was small and bifurcated. 5. The right  coronary was a dominant vessel. There was dilatation a  centimeter after the proximal portion of the ostia. There was no ostial  or proximal stenosis and the dilatation was mild without aneurysm  formation. The remainder of the vessel was widely patent and smooth with  a small but normal PDA and PLA and normal RV branches. 6. LV angiogram in the RAO and LAO projection showed a normally contracting  ventricle which was vigorous, EF approximately 60% or greater with  angiographic LVH. There was no mitral regurgitation. 7. Abdominal aortic angiogram in the midstream PA projection showed a normal  SMA and celiac access, normal single right renal artery, and dual normal  left renal arteries. The infrarenal abdominal aorta was widely patent  with no stenosis or aneurysm formation and no significant irregularity.  The common iliacs were normal. The proximal external iliacs were normal,  mildly tortuous, and the hypogastrics were intact with good runoff  bilaterally.  DISCUSSION: This 77 year old patient of Dr. Caron Presume has a remote history of catheterization without significant coronary disease over a decade ago at [facility] but these records are not available. She has morbid obesity, a history of diabetes, past CVAs, nonsmoker with COPD and asthma, remote cholecystectomy, and remote hiatal hernia repair presumably for hiatal hernia and GI bleeding several years ago at [place] [place]. She is admitted now with chest pain felt to be compatible with ischemia. Diagnostic catheterization shows no significant coronary artery disease. There is mild dilatation in the proximal third of the RCA but no aneurysm formation and no significant stenosis. There is less than 50% lesion of the distal circumflex, good normal LV function and mild hypertension.  RECOMMENDATIONS: I would recommend medical therapy. She will probably need GI evaluation with her  history of chest pain and past GI disease.  CATHETERIZATION DIAGNOSES: 1. Chest pain etiology not determined. 2. Possible gastroesophageal reflux disease and/or esophageal spasm. 3. Remote hiatal hernia repair, [place]. 4. Remote cholecystectomy. 5. Severe morbid obesity. 6. Osteoarthritis. 7. Bilateral cataracts. 8. Adult onset diabetes mellitus. 9. Prior cerebrovascular accidents. 10. Remote hysterectomy. 11. History of palpitations.   01/2018 echo Study Conclusions  - Left ventricle: The cavity size was normal. Wall thickness was   increased in a pattern of mild LVH. Systolic function was normal.   The estimated ejection fraction was in the range of 55% to 60%.   Left ventricular diastolic function parameters were normal. - Aortic valve: There was mild stenosis. Valve area (VTI): 1.29   cm^2. Valve area (Vmax): 1.41 cm^2. Valve area (Vmean): 1.37   cm^2. - Mitral valve: Calcified annulus. - Left atrium: The atrium was mildly dilated. - Atrial septum: No defect or patent foramen ovale was  identified. - Impressions: Prominant epicardial adipose tissue.  Impressions:  - Prominant epicardial adipose tissue.     Assessment and Plan  1. CAD -nonobstructive CAD by prior cath -ongoing atypical chest pain/neck pain - continue to monitor at this time.   2. Hyperlipidemia - intolerant to statins, on zetia. - we will ask for labs from pcp  3. HTN -at goal, continue current meds  4. Aortic stenosis - mild by echo in 2016, will repeat echo for surveillance.   F/u 6 months      Arnoldo Lenis, M.D., F.A.C.C.

## 2018-08-18 ENCOUNTER — Encounter: Payer: Self-pay | Admitting: Cardiology

## 2018-08-21 DIAGNOSIS — C069 Malignant neoplasm of mouth, unspecified: Secondary | ICD-10-CM | POA: Diagnosis not present

## 2018-08-21 DIAGNOSIS — M25561 Pain in right knee: Secondary | ICD-10-CM | POA: Diagnosis not present

## 2018-08-21 DIAGNOSIS — I2510 Atherosclerotic heart disease of native coronary artery without angina pectoris: Secondary | ICD-10-CM | POA: Diagnosis not present

## 2018-08-21 DIAGNOSIS — D72829 Elevated white blood cell count, unspecified: Secondary | ICD-10-CM | POA: Diagnosis not present

## 2018-08-21 DIAGNOSIS — N184 Chronic kidney disease, stage 4 (severe): Secondary | ICD-10-CM | POA: Diagnosis not present

## 2018-08-21 DIAGNOSIS — I129 Hypertensive chronic kidney disease with stage 1 through stage 4 chronic kidney disease, or unspecified chronic kidney disease: Secondary | ICD-10-CM | POA: Diagnosis not present

## 2018-08-21 DIAGNOSIS — M25512 Pain in left shoulder: Secondary | ICD-10-CM | POA: Diagnosis not present

## 2018-08-21 DIAGNOSIS — E1122 Type 2 diabetes mellitus with diabetic chronic kidney disease: Secondary | ICD-10-CM | POA: Diagnosis not present

## 2018-08-21 DIAGNOSIS — R531 Weakness: Secondary | ICD-10-CM | POA: Diagnosis not present

## 2018-08-21 DIAGNOSIS — Z8673 Personal history of transient ischemic attack (TIA), and cerebral infarction without residual deficits: Secondary | ICD-10-CM | POA: Diagnosis not present

## 2018-08-21 DIAGNOSIS — W19XXXD Unspecified fall, subsequent encounter: Secondary | ICD-10-CM | POA: Diagnosis not present

## 2018-08-21 DIAGNOSIS — Z7902 Long term (current) use of antithrombotics/antiplatelets: Secondary | ICD-10-CM | POA: Diagnosis not present

## 2018-08-21 DIAGNOSIS — J449 Chronic obstructive pulmonary disease, unspecified: Secondary | ICD-10-CM | POA: Diagnosis not present

## 2018-08-21 DIAGNOSIS — E440 Moderate protein-calorie malnutrition: Secondary | ICD-10-CM | POA: Diagnosis not present

## 2018-08-21 DIAGNOSIS — Z79891 Long term (current) use of opiate analgesic: Secondary | ICD-10-CM | POA: Diagnosis not present

## 2018-08-22 DIAGNOSIS — M25561 Pain in right knee: Secondary | ICD-10-CM | POA: Diagnosis not present

## 2018-08-23 ENCOUNTER — Other Ambulatory Visit: Payer: Self-pay

## 2018-08-23 ENCOUNTER — Observation Stay (HOSPITAL_COMMUNITY)
Admission: EM | Admit: 2018-08-23 | Discharge: 2018-08-24 | Disposition: A | Discharge status: Home / Self Care | Payer: Medicare Other | Attending: Internal Medicine | Admitting: Internal Medicine

## 2018-08-23 ENCOUNTER — Other Ambulatory Visit (HOSPITAL_COMMUNITY): Payer: Self-pay | Admitting: Internal Medicine

## 2018-08-23 ENCOUNTER — Ambulatory Visit (HOSPITAL_COMMUNITY)
Admission: RE | Admit: 2018-08-23 | Discharge: 2018-08-23 | Disposition: A | Discharge status: Home / Self Care | Payer: Medicare Other | Source: Ambulatory Visit | Attending: Internal Medicine | Admitting: Internal Medicine

## 2018-08-23 ENCOUNTER — Encounter (HOSPITAL_COMMUNITY): Payer: Self-pay | Admitting: Emergency Medicine

## 2018-08-23 DIAGNOSIS — Z8673 Personal history of transient ischemic attack (TIA), and cerebral infarction without residual deficits: Secondary | ICD-10-CM | POA: Diagnosis not present

## 2018-08-23 DIAGNOSIS — M545 Low back pain: Secondary | ICD-10-CM | POA: Diagnosis not present

## 2018-08-23 DIAGNOSIS — I129 Hypertensive chronic kidney disease with stage 1 through stage 4 chronic kidney disease, or unspecified chronic kidney disease: Secondary | ICD-10-CM | POA: Insufficient documentation

## 2018-08-23 DIAGNOSIS — F329 Major depressive disorder, single episode, unspecified: Secondary | ICD-10-CM | POA: Insufficient documentation

## 2018-08-23 DIAGNOSIS — R5383 Other fatigue: Secondary | ICD-10-CM | POA: Diagnosis not present

## 2018-08-23 DIAGNOSIS — M8580 Other specified disorders of bone density and structure, unspecified site: Secondary | ICD-10-CM | POA: Insufficient documentation

## 2018-08-23 DIAGNOSIS — F419 Anxiety disorder, unspecified: Secondary | ICD-10-CM | POA: Diagnosis not present

## 2018-08-23 DIAGNOSIS — E860 Dehydration: Secondary | ICD-10-CM | POA: Diagnosis not present

## 2018-08-23 DIAGNOSIS — Z79899 Other long term (current) drug therapy: Secondary | ICD-10-CM | POA: Diagnosis not present

## 2018-08-23 DIAGNOSIS — I2510 Atherosclerotic heart disease of native coronary artery without angina pectoris: Secondary | ICD-10-CM | POA: Diagnosis not present

## 2018-08-23 DIAGNOSIS — Z9049 Acquired absence of other specified parts of digestive tract: Secondary | ICD-10-CM | POA: Insufficient documentation

## 2018-08-23 DIAGNOSIS — E1122 Type 2 diabetes mellitus with diabetic chronic kidney disease: Secondary | ICD-10-CM | POA: Diagnosis not present

## 2018-08-23 DIAGNOSIS — J449 Chronic obstructive pulmonary disease, unspecified: Secondary | ICD-10-CM | POA: Diagnosis not present

## 2018-08-23 DIAGNOSIS — Z79891 Long term (current) use of opiate analgesic: Secondary | ICD-10-CM | POA: Diagnosis not present

## 2018-08-23 DIAGNOSIS — N289 Disorder of kidney and ureter, unspecified: Secondary | ICD-10-CM | POA: Diagnosis not present

## 2018-08-23 DIAGNOSIS — Z880 Allergy status to penicillin: Secondary | ICD-10-CM | POA: Diagnosis not present

## 2018-08-23 DIAGNOSIS — C060 Malignant neoplasm of cheek mucosa: Secondary | ICD-10-CM | POA: Insufficient documentation

## 2018-08-23 DIAGNOSIS — Z881 Allergy status to other antibiotic agents status: Secondary | ICD-10-CM | POA: Diagnosis not present

## 2018-08-23 DIAGNOSIS — Z9181 History of falling: Secondary | ICD-10-CM | POA: Diagnosis not present

## 2018-08-23 DIAGNOSIS — M25512 Pain in left shoulder: Secondary | ICD-10-CM | POA: Diagnosis not present

## 2018-08-23 DIAGNOSIS — Z886 Allergy status to analgesic agent status: Secondary | ICD-10-CM | POA: Insufficient documentation

## 2018-08-23 DIAGNOSIS — Z9841 Cataract extraction status, right eye: Secondary | ICD-10-CM | POA: Insufficient documentation

## 2018-08-23 DIAGNOSIS — E440 Moderate protein-calorie malnutrition: Secondary | ICD-10-CM | POA: Diagnosis not present

## 2018-08-23 DIAGNOSIS — Z882 Allergy status to sulfonamides status: Secondary | ICD-10-CM | POA: Diagnosis not present

## 2018-08-23 DIAGNOSIS — M549 Dorsalgia, unspecified: Secondary | ICD-10-CM

## 2018-08-23 DIAGNOSIS — Z7902 Long term (current) use of antithrombotics/antiplatelets: Secondary | ICD-10-CM | POA: Insufficient documentation

## 2018-08-23 DIAGNOSIS — D72829 Elevated white blood cell count, unspecified: Secondary | ICD-10-CM | POA: Diagnosis not present

## 2018-08-23 DIAGNOSIS — N181 Chronic kidney disease, stage 1: Secondary | ICD-10-CM | POA: Diagnosis not present

## 2018-08-23 DIAGNOSIS — M533 Sacrococcygeal disorders, not elsewhere classified: Secondary | ICD-10-CM | POA: Diagnosis not present

## 2018-08-23 DIAGNOSIS — N179 Acute kidney failure, unspecified: Secondary | ICD-10-CM | POA: Diagnosis not present

## 2018-08-23 DIAGNOSIS — N183 Chronic kidney disease, stage 3 (moderate): Secondary | ICD-10-CM | POA: Insufficient documentation

## 2018-08-23 DIAGNOSIS — K219 Gastro-esophageal reflux disease without esophagitis: Secondary | ICD-10-CM | POA: Diagnosis not present

## 2018-08-23 DIAGNOSIS — Z87891 Personal history of nicotine dependence: Secondary | ICD-10-CM | POA: Diagnosis not present

## 2018-08-23 DIAGNOSIS — L089 Local infection of the skin and subcutaneous tissue, unspecified: Secondary | ICD-10-CM | POA: Diagnosis not present

## 2018-08-23 DIAGNOSIS — E785 Hyperlipidemia, unspecified: Secondary | ICD-10-CM | POA: Diagnosis not present

## 2018-08-23 DIAGNOSIS — E875 Hyperkalemia: Secondary | ICD-10-CM | POA: Insufficient documentation

## 2018-08-23 DIAGNOSIS — I252 Old myocardial infarction: Secondary | ICD-10-CM | POA: Diagnosis not present

## 2018-08-23 DIAGNOSIS — W19XXXD Unspecified fall, subsequent encounter: Secondary | ICD-10-CM | POA: Diagnosis not present

## 2018-08-23 DIAGNOSIS — R531 Weakness: Secondary | ICD-10-CM | POA: Diagnosis not present

## 2018-08-23 DIAGNOSIS — C069 Malignant neoplasm of mouth, unspecified: Secondary | ICD-10-CM | POA: Diagnosis not present

## 2018-08-23 DIAGNOSIS — N184 Chronic kidney disease, stage 4 (severe): Secondary | ICD-10-CM | POA: Diagnosis not present

## 2018-08-23 DIAGNOSIS — M25561 Pain in right knee: Secondary | ICD-10-CM | POA: Diagnosis not present

## 2018-08-23 DIAGNOSIS — Z961 Presence of intraocular lens: Secondary | ICD-10-CM | POA: Insufficient documentation

## 2018-08-23 DIAGNOSIS — C760 Malignant neoplasm of head, face and neck: Secondary | ICD-10-CM | POA: Diagnosis not present

## 2018-08-23 LAB — COMPREHENSIVE METABOLIC PANEL
ALBUMIN: 3.6 g/dL (ref 3.5–5.0)
ALK PHOS: 93 U/L (ref 38–126)
ALT: 15 U/L (ref 0–44)
ANION GAP: 8 (ref 5–15)
AST: 17 U/L (ref 15–41)
BUN: 12 mg/dL (ref 8–23)
CO2: 26 mmol/L (ref 22–32)
Calcium: 13 mg/dL — ABNORMAL HIGH (ref 8.9–10.3)
Chloride: 102 mmol/L (ref 98–111)
Creatinine, Ser: 1.79 mg/dL — ABNORMAL HIGH (ref 0.44–1.00)
GFR calc Af Amer: 30 mL/min — ABNORMAL LOW (ref >60)
GFR calc non Af Amer: 26 mL/min — ABNORMAL LOW (ref >60)
GLUCOSE: 102 mg/dL — AB (ref 70–99)
POTASSIUM: 5.4 mmol/L — AB (ref 3.5–5.1)
Sodium: 136 mmol/L (ref 135–145)
TOTAL PROTEIN: 7.8 g/dL (ref 6.5–8.1)
Total Bilirubin: 0.2 mg/dL — ABNORMAL LOW (ref 0.3–1.2)

## 2018-08-23 LAB — CBC
HCT: 44.7 % (ref 36.0–46.0)
HEMOGLOBIN: 13.3 g/dL (ref 12.0–15.0)
MCH: 27.4 pg (ref 26.0–34.0)
MCHC: 29.8 g/dL — AB (ref 30.0–36.0)
MCV: 92 fL (ref 80.0–100.0)
PLATELETS: 281 10*3/uL (ref 150–400)
RBC: 4.86 MIL/uL (ref 3.87–5.11)
RDW: 13.6 % (ref 11.5–15.5)
WBC: 11.9 10*3/uL — ABNORMAL HIGH (ref 4.0–10.5)
nRBC: 0 % (ref 0.0–0.2)

## 2018-08-23 MED ORDER — SODIUM CHLORIDE 0.9 % IV SOLN
1000.0000 mL | INTRAVENOUS | Status: DC
  Administered 2018-08-23 – 2018-08-24 (×3): 1000 mL via INTRAVENOUS

## 2018-08-23 MED ORDER — SODIUM CHLORIDE 0.9 % IV BOLUS (SEPSIS)
1000.0000 mL | Freq: Once | INTRAVENOUS | Status: AC
  Administered 2018-08-23: 1000 mL via INTRAVENOUS

## 2018-08-23 NOTE — ED Provider Notes (Signed)
[facility] EMERGENCY DEPARTMENT Provider Note   CSN: [id number] Arrival date & time: 08/23/18  1743     History   Chief Complaint Chief Complaint  Patient presents with  . Abnormal Lab    HPI CAMDYNN [name] [name] is a 77 y.o. female.  HPI Pt has a complex medical history.  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  Pt had been feeling weak and fatigued for the last week.  She saw her doctor and had outpatient lab tests today.  Pt was sent to the ED because her potassium result was extremely high. Medical records however indicate she was recently admitted to [facility] with an elevated calcium level.   Pt denies nausea or vomiting.  NO fevers or chills.  No chest pain or shortness of breath. Past Medical History:  Diagnosis Date  . Anxiety   . Cancer ([place])   . Depression   . Diabetes mellitus without complication ([place])   . GERD (gastroesophageal reflux disease)   . Hyperlipidemia   . Hypertension   . Myocardial infarction ([place])   . Renal disorder    kidney failure   . Stroke [facility])    times 2    Patient Active Problem List   Diagnosis Date Noted  . Squamous cell cancer of buccal mucosa ([place]) 01/07/2017  . Chronic kidney disease (CKD), stage IV (severe) ([place]) 01/07/2017  . Dehydration 03/10/2015  . Protein-calorie malnutrition, severe ([place]) 12/24/2014  . Metabolic acidosis 93/23/5573  . Hypovolemic shock ([place]) 12/24/2014  . Encephalopathy acute 10/21/2014  . CKD (chronic kidney disease) 10/21/2014  . Fall at home 10/21/2014  . H/O: CVA (cerebrovascular accident) 10/21/2014  . Altered bowel elimination due to intestinal ostomy ([place]) 10/21/2014  . 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 Acute on chronic renal failure ([place]) 03/11/2014  . Contusion of left hip 03/10/2014  . Hip pain 03/10/2014  . DOE (dyspnea on exertion) 08/18/2012  . Chest pain 08/04/2011    . Tobacco dipper 04/29/2011  . Anxiety   . Depression   . Hypertension   . GERD (gastroesophageal reflux disease)   . Hyperlipidemia   . Myocardial infarction ([place])   . Stroke ([place])   . CAD, NATIVE VESSEL 02/20/2010  . SHOULDER PAIN 10/09/2007  . RUPTURE ROTATOR CUFF 10/09/2007    Past Surgical History:  Procedure Laterality Date  . ABDOMINAL HYSTERECTOMY    . CATARACT EXTRACTION W/PHACO Right 03/20/2013   Procedure: CATARACT EXTRACTION PHACO AND INTRAOCULAR LENS PLACEMENT (IOC);  Surgeon: Elta Guadeloupe T. Gershon Crane, MD;  Location: [facility];  Service: Ophthalmology;  Laterality: Right;  CDE:  6.29   . CHOLECYSTECTOMY    . COLOSTOMY    . OOPHORECTOMY    . TONSILLECTOMY       OB History   None      Home Medications    Prior to Admission medications   Medication Sig Start Date End Date Taking? Authorizing Provider  acetaminophen (TYLENOL) 500 MG tablet Take 1 tablet (500 mg total) by mouth every 6 (six) hours as needed for mild pain. 12/30/14  Yes Samuella Cota, MD  albuterol (PROVENTIL) 4 MG tablet Take by mouth. 07/03/12  Yes [provider]  ALPRAZolam Duanne Moron) 0.5 MG tablet Take 1 tablet (0.5 mg total) by mouth 4 (four) times daily as needed for anxiety or sleep. 12/30/14  Yes Samuella Cota, MD  amitriptyline (ELAVIL) 25 MG  tablet Take by mouth. 12/21/11  Yes [provider]  atorvastatin (LIPITOR) 20 MG tablet Take by mouth. 12/21/11  Yes [provider]  cholecalciferol (VITAMIN D) 1000 units tablet Take 1,000 Units by mouth daily. 2000 units daily   Yes [provider]  clopidogrel (PLAVIX) 75 MG tablet Take 1 tablet (75 mg total) by mouth daily. 09/26/12  Yes Wall, Marijo Conception, MD  cyclobenzaprine (FLEXERIL) 5 MG tablet Take 1 tablet by mouth daily. 08/07/18  Yes [provider]  escitalopram (LEXAPRO) 20 MG tablet Take 20 mg by mouth daily.   Yes [provider]  esomeprazole (NEXIUM) 40 MG capsule  12/21/11  Yes [provider]  feeding supplement, RESOURCE BREEZE, (RESOURCE BREEZE) LIQD Take 1 Container by mouth 3 (three) times daily between meals. 12/30/14  Yes Samuella Cota, MD  HYDROcodone-acetaminophen [facility]) 10-325 MG per tablet Take 1 tablet by mouth every 6 (six) hours as needed for moderate pain. Patient taking differently: Take 0.5-1 tablets by mouth every 6 (six) hours as needed for moderate pain.  12/30/14  Yes Samuella Cota, MD  magnesium oxide (MAG-OX) 400 (241.3 Mg) MG tablet Take 2 tablets by mouth 2 (two) times daily. 08/15/18  Yes [provider]  potassium chloride (KLOR-CON) 20 MEQ packet Take 1 packet by mouth 2 (two) times daily. 08/15/18  Yes [provider]  ROZEREM 8 MG tablet Take 8 mg by mouth at bedtime. 12/08/16  Yes [provider]  traMADol (ULTRAM) 50 MG tablet Take 1 tablet by mouth as needed. 08/15/18  Yes [provider]  amoxicillin-clavulanate (AUGMENTIN) 500-125 MG tablet Take by mouth. 08/15/18 08/23/18  [provider]  NITROSTAT 0.4 MG SL tablet Place 0.4 mg under the tongue every 5 (five) minutes as needed for chest pain.  07/28/12   [provider]  ondansetron (ZOFRAN) 4 MG tablet Take 1 tablet by mouth daily as needed. 08/15/18   [provider]    Family History Family History  Problem Relation Age of Onset  . Cancer Other   . Diabetes Other   . Abnormal EKG Other   . Arthritis Other   . Asthma Other   . Kidney disease Other     Social History Social History   Tobacco Use  . Smoking status: Former Smoker    Packs/day: 0.25    Years: 15.00    Pack years: 3.75    Types: Cigarettes    Start date: 12/12/1956    Last attempt to quit: 08/18/1972    Years since quitting: 46.0  . Smokeless tobacco: Current User    Types: Snuff  . Tobacco comment: 1-2 cigs daily x 6 months  Substance Use Topics  . Alcohol use: No    Alcohol/week: 0.0 standard drinks  . Drug use: No     Allergies     Ciprofloxacin; Aspirin; Penicillins; Propoxyphene n-acetaminophen; and Sulfamethoxazole   Review of Systems Review of Systems  All other systems reviewed and are negative.    Physical Exam Updated Vital Signs BP 111/74   Pulse 83   Temp 98.1 F (36.7 C) (Oral)   Resp 11   Ht 1.524 m (5')   Wt 68 kg   SpO2 96%   BMI 29.29 kg/m   Physical Exam  Constitutional: No distress.  HENT:  Head: Normocephalic.  Right Ear: External ear normal.  Left Ear: External ear normal.  Fungating mass arounds lips, cheek  Eyes: Conjunctivae are normal. Right eye exhibits  no discharge. Left eye exhibits no discharge. No scleral icterus.  Neck: Neck supple. No tracheal deviation present.  Cardiovascular: Normal rate, regular rhythm and intact distal pulses.  Pulmonary/Chest: Effort normal and breath sounds normal. No stridor. No respiratory distress. She has no wheezes. She has no rales.  Abdominal: Soft. Bowel sounds are normal. She exhibits no distension. There is no tenderness. There is no rebound and no guarding.  Musculoskeletal: She exhibits no edema or tenderness.  Neurological: She is alert. She has normal strength. No cranial nerve deficit (no facial droop, extraocular movements intact, no slurred speech) or sensory deficit. She exhibits normal muscle tone. She displays no seizure activity. Coordination normal.  Skin: Skin is warm and dry. No rash noted.  Psychiatric: She has a normal mood and affect.  Nursing note and vitals reviewed.    ED Treatments / Results  Labs (all labs ordered are listed, but only abnormal results are displayed) Labs Reviewed  CBC - Abnormal; Notable for the following components:      Result Value   WBC 11.9 (*)    MCHC 29.8 (*)    All other components within normal limits  COMPREHENSIVE METABOLIC PANEL - Abnormal; Notable for the following components:   Potassium 5.4 (*)    Glucose, Bld 102 (*)    Creatinine, Ser 1.79 (*)    Calcium 13.0 (*)     Total Bilirubin 0.2 (*)    GFR calc non Af Amer 26 (*)    GFR calc Af Amer 30 (*)    All other components within normal limits    EKG EKG Interpretation  Date/Time:  Wednesday August 23 2018 18:12:26 EDT Ventricular Rate:  98 PR Interval:    QRS Duration: 84 QT Interval:  305 QTC Calculation: 390 R Axis:   -155 Text Interpretation:  Sinus rhythm Inferior infarct, old Abnormal lateral Q waves Anterior infarct, old No significant change since last tracing Confirmed by Dorie Rank [phone]) on 08/23/2018 6:25:49 PM   Radiology Dg Lumbar Spine 2-3 Views  Result Date: 08/23/2018 CLINICAL DATA:  Low back pain. Posttraumatic left sacroiliac joint pain. The patient fell several weeks ago. EXAM: LUMBAR SPINE - 2-3 VIEW COMPARISON:  CT scan of the abdomen and pelvis dated 12/22/2014 FINDINGS: There is no evidence of lumbar spine fracture. Alignment is normal. There is slight degeneration of the L5-S1 disc. The other disc spaces appear normal. Diffuse osteopenia. IMPRESSION: No acute abnormality. Slight chronic degenerative changes of the L5-S1 disc. Electronically Signed   By: Lorriane Shire M.D.   On: 08/23/2018 16:56    Procedures Procedures (including critical care time)  Medications Ordered in ED Medications  sodium chloride 0.9 % bolus 1,000 mL (0 mLs Intravenous Stopped 08/23/18 2031)    Followed by  0.9 %  sodium chloride infusion (1,000 mLs Intravenous New Bag/Given 08/23/18 1830)     Initial Impression / Assessment and Plan / ED Course  I have reviewed the triage vital signs and the nursing notes.  Pertinent labs & imaging results that were available during my care of the patient were reviewed by me and considered in my medical decision making (see chart for details).  Clinical Course as of Aug 24 2111  Wed Aug 23, 2018  2054 Labs reviewed.  Potassium is mildly elevated at 5.4 however the calcium is elevated at 13.   [JK]  2055 Care everywhere notes reviewed.  Calcium level  was 11.1 earlier this month.   [JK]    Clinical Course  User Index [JK] Dorie Rank, MD    Patient's labs are notable for recurrent hypercalcemia.  She has a mild increase in her potassium but I do not think this is clinically significant.  Patient was previously in the hospital for similar symptoms.  I have ordered IV hydration.  I think she would benefit from overnight hydration to promote calciuresis.  Final Clinical Impressions(s) / ED Diagnoses   Final diagnoses:  Hypercalcemia  Renal insufficiency      Dorie Rank, MD 08/23/18 2114

## 2018-08-23 NOTE — H&P (Addendum)
TRH H&P    Patient Demographics:    [name] [name], is a 77 y.o. female  MRN: [id number]  DOB - Jul 25, 1941  Admit Date - 08/23/2018  Referring MD/NP/PA: Marye Round  Outpatient Primary MD for the patient is Redmond School, MD  Patient coming from: Home  Chief complaint-abnormal labs   HPI:    [name] [name]  is a 77 y.o. female, with history of squamous cell cancer of buccal mucosa getting chemo and radiation at [facility], depression,  stroke, hypertension was sent to the hospital by PCP for abnormal labs.  Patient was told to go to hospital for electrolyte abnormality.  In the ED patient was found to have hypercalcemia with calcium 13.0 and mild hyperkalemia with potassium 5.4. She denies any symptoms. Denies fever or chills. Denies abdominal pain. No mental status changes. Denies blurred vision or passing out. Denies chest pain, shortness of breath Denies nausea, vomiting, diarrhea    Review of systems:      All other systems reviewed and are negative.   With Past History of the following :    Past Medical History:  Diagnosis Date  . Anxiety   . Cancer ([place])   . Depression   . Diabetes mellitus without complication ([place])   . GERD (gastroesophageal reflux disease)   . Hyperlipidemia   . Hypertension   . Myocardial infarction ([place])   . Renal disorder    kidney failure   . Stroke [facility])    times 2      Past Surgical History:  Procedure Laterality Date  . ABDOMINAL HYSTERECTOMY    . CATARACT EXTRACTION W/PHACO Right 03/20/2013   Procedure: CATARACT EXTRACTION PHACO AND INTRAOCULAR LENS PLACEMENT (IOC);  Surgeon: Elta Guadeloupe T. Gershon Crane, MD;  Location: [facility];  Service: Ophthalmology;  Laterality: Right;  CDE:  6.29   . CHOLECYSTECTOMY    . COLOSTOMY    . OOPHORECTOMY    . TONSILLECTOMY        Social History:      Social History   Tobacco Use  . Smoking status: Former  Smoker    Packs/day: 0.25    Years: 15.00    Pack years: 3.75    Types: Cigarettes    Start date: 12/12/1956    Last attempt to quit: 08/18/1972    Years since quitting: 46.0  . Smokeless tobacco: Current User    Types: Snuff  . Tobacco comment: 1-2 cigs daily x 6 months  Substance Use Topics  . Alcohol use: No    Alcohol/week: 0.0 standard drinks       Family History :     Family History  Problem Relation Age of Onset  . Cancer Other   . Diabetes Other   . Abnormal EKG Other   . Arthritis Other   . Asthma Other   . Kidney disease Other       Home Medications:   Prior to Admission medications   Medication Sig Start Date End Date Taking? Authorizing Provider  acetaminophen (TYLENOL) 500 MG tablet Take 1 tablet (500  mg total) by mouth every 6 (six) hours as needed for mild pain. 12/30/14  Yes Samuella Cota, MD  albuterol (PROVENTIL) 4 MG tablet Take by mouth. 07/03/12  Yes [provider]  ALPRAZolam Duanne Moron) 0.5 MG tablet Take 1 tablet (0.5 mg total) by mouth 4 (four) times daily as needed for anxiety or sleep. 12/30/14  Yes Samuella Cota, MD  amitriptyline (ELAVIL) 25 MG tablet Take by mouth. 12/21/11  Yes [provider]  atorvastatin (LIPITOR) 20 MG tablet Take by mouth. 12/21/11  Yes [provider]  cholecalciferol (VITAMIN D) 1000 units tablet Take 1,000 Units by mouth daily. 2000 units daily   Yes [provider]  clopidogrel (PLAVIX) 75 MG tablet Take 1 tablet (75 mg total) by mouth daily. 09/26/12  Yes Wall, Marijo Conception, MD  cyclobenzaprine (FLEXERIL) 5 MG tablet Take 1 tablet by mouth daily. 08/07/18  Yes [provider]  escitalopram (LEXAPRO) 20 MG tablet Take 20 mg by mouth daily.   Yes [provider]  esomeprazole (NEXIUM) 40 MG capsule  12/21/11  Yes [provider]  feeding supplement, RESOURCE BREEZE, (RESOURCE BREEZE) LIQD Take 1 Container by mouth 3 (three) times daily between meals. 12/30/14   Yes Samuella Cota, MD  HYDROcodone-acetaminophen [facility]) 10-325 MG per tablet Take 1 tablet by mouth every 6 (six) hours as needed for moderate pain. Patient taking differently: Take 0.5-1 tablets by mouth every 6 (six) hours as needed for moderate pain.  12/30/14  Yes Samuella Cota, MD  magnesium oxide (MAG-OX) 400 (241.3 Mg) MG tablet Take 2 tablets by mouth 2 (two) times daily. 08/15/18  Yes [provider]  potassium chloride (KLOR-CON) 20 MEQ packet Take 1 packet by mouth 2 (two) times daily. 08/15/18  Yes [provider]  ROZEREM 8 MG tablet Take 8 mg by mouth at bedtime. 12/08/16  Yes [provider]  traMADol (ULTRAM) 50 MG tablet Take 1 tablet by mouth as needed. 08/15/18  Yes [provider]  amoxicillin-clavulanate (AUGMENTIN) 500-125 MG tablet Take by mouth. 08/15/18 08/23/18  [provider]  NITROSTAT 0.4 MG SL tablet Place 0.4 mg under the tongue every 5 (five) minutes as needed for chest pain.  07/28/12   [provider]  ondansetron (ZOFRAN) 4 MG tablet Take 1 tablet by mouth daily as needed. 08/15/18   [provider]     Allergies:     Allergies  Allergen Reactions  . Ciprofloxacin Anaphylaxis  . Aspirin Nausea And Vomiting  . Penicillins Nausea And Vomiting  . Propoxyphene N-Acetaminophen Nausea And Vomiting  . Sulfamethoxazole Rash     Physical Exam:   Vitals  Blood pressure 111/74, pulse 83, temperature 98.1 F (36.7 C), temperature source Oral, resp. rate 11, height 5' (1.524 m), weight 68 kg, SpO2 96 %.  1.  General: Appears in no acute distress  2. Psychiatric:  Intact judgement and  insight, awake alert, oriented x 3.  3. Neurologic: No focal neurological deficits, all cranial nerves intact.Strength 5/5 all 4 extremities, sensation intact all 4 extremities, plantars down going.  4. Eyes :  anicteric sclerae, moist conjunctivae with no lid lag. PERRLA.  5. ENMT:  Large swelling noted  on the left buccal mucosa with dressing in place  6. Neck:  supple, no cervical lymphadenopathy appriciated, No thyromegaly  7. Respiratory : Normal respiratory effort, good air movement bilaterally,clear to  auscultation bilaterally  8. Cardiovascular : RRR, no gallops, rubs or murmurs, no leg edema  9. Gastrointestinal:  Positive bowel sounds, abdomen soft, non-tender to palpation,no hepatosplenomegaly, no rigidity or guarding.  Colostomy noted      10. Skin:  No cyanosis, normal texture and turgor, no rash, lesions or ulcers  11.Musculoskeletal:  Good muscle tone,  joints appear normal , no effusions,  normal range of motion    Data Review:    CBC Recent Labs  Lab 08/23/18 1817  WBC 11.9*  HGB 13.3  HCT 44.7  PLT 281  MCV 92.0  MCH 27.4  MCHC 29.8*  RDW 13.6   ------------------------------------------------------------------------------------------------------------------  Results for orders placed or performed during the hospital encounter of 08/23/18 (from the past 48 hour(s))  CBC     Status: Abnormal   Collection Time: 08/23/18  6:17 PM  Result Value Ref Range   WBC 11.9 (H) 4.0 - 10.5 K/uL   RBC 4.86 3.87 - 5.11 MIL/uL   Hemoglobin 13.3 12.0 - 15.0 g/dL   HCT 44.7 36.0 - 46.0 %   MCV 92.0 80.0 - 100.0 fL   MCH 27.4 26.0 - 34.0 pg   MCHC 29.8 (L) 30.0 - 36.0 g/dL   RDW 13.6 11.5 - 15.5 %   Platelets 281 150 - 400 K/uL   nRBC 0.0 0.0 - 0.2 %    Comment: Performed at [facility], [address], [place], [place] [address]  Comprehensive metabolic panel     Status: Abnormal   Collection Time: 08/23/18  6:24 PM  Result Value Ref Range   Sodium 136 135 - 145 mmol/L   Potassium 5.4 (H) 3.5 - 5.1 mmol/L   Chloride 102 98 - 111 mmol/L   CO2 26 22 - 32 mmol/L   Glucose, Bld 102 (H) 70 - 99 mg/dL   BUN 12 8 - 23 mg/dL   Creatinine, Ser 1.79 (H) 0.44 - 1.00 mg/dL   Calcium 13.0 (H) 8.9 - 10.3 mg/dL   Total Protein 7.8 6.5 - 8.1 g/dL   Albumin 3.6 3.5 -  5.0 g/dL   AST 17 15 - 41 U/L   ALT 15 0 - 44 U/L   Alkaline Phosphatase 93 38 - 126 U/L   Total Bilirubin 0.2 (L) 0.3 - 1.2 mg/dL   GFR calc non Af Amer 26 (L) >60 mL/min   GFR calc Af Amer 30 (L) >60 mL/min    Comment: (NOTE) The eGFR has been calculated using the CKD EPI equation. This calculation has not been validated in all clinical situations. eGFR's persistently <60 mL/min signify possible Chronic Kidney Disease.    Anion gap 8 5 - 15    Comment: Performed at [facility], [address] [place], [place] [address]    Chemistries  Recent Labs  Lab 08/23/18 1824  NA 136  K 5.4*  CL 102  CO2 26  GLUCOSE 102*  BUN 12  CREATININE 1.79*  CALCIUM 13.0*  AST 17  ALT 15  ALKPHOS 93  BILITOT 0.2*   ------------------------------------------------------------------------------------------------------------------  ------------------------------------------------------------------------------------------------------------------ GFR: Estimated Creatinine Clearance: 22.6 mL/min (A) (by C-G formula based on SCr of 1.79 mg/dL (H)). Liver Function Tests: Recent Labs  Lab 08/23/18 1824  AST 17  ALT 15  ALKPHOS 93  BILITOT 0.2*  PROT 7.8  ALBUMIN 3.6    --------------------------------------------------------------------------------------------------------------- Urine analysis:    Component Value Date/Time   COLORURINE YELLOW 03/10/2015 1715   APPEARANCEUR HAZY (A) 03/10/2015 1715   LABSPEC >1.030 (H) 03/10/2015 1715   PHURINE 5.0 03/10/2015 [place] 03/10/2015 1715  HGBUR NEGATIVE 03/10/2015 [place] 03/10/2015 [place] 03/10/2015 1715   PROTEINUR NEGATIVE 03/10/2015 1715   UROBILINOGEN 0.2 03/10/2015 1715   NITRITE NEGATIVE 03/10/2015 1715   LEUKOCYTESUR NEGATIVE 03/10/2015 1715      Imaging Results:    Dg Lumbar Spine 2-3 Views  Result Date: 08/23/2018 CLINICAL DATA:  Low back pain. Posttraumatic  left sacroiliac joint pain. The patient fell several weeks ago. EXAM: LUMBAR SPINE - 2-3 VIEW COMPARISON:  CT scan of the abdomen and pelvis dated 12/22/2014 FINDINGS: There is no evidence of lumbar spine fracture. Alignment is normal. There is slight degeneration of the L5-S1 disc. The other disc spaces appear normal. Diffuse osteopenia. IMPRESSION: No acute abnormality. Slight chronic degenerative changes of the L5-S1 disc. Electronically Signed   By: Lorriane Shire M.D.   On: 08/23/2018 16:56    My personal review of EKG: Rhythm NSR   Assessment & Plan:    Active Problems:   Hypercalcemia   1. Hypercalcemia-secondary to malignancy, patient's calcium is 13.0.  Start aggressive IV hydration with normal saline at 125 mill per hour.  Follow serum calcium levels in a.m.  If no improvement consider pamidronate  2. Hyperkalemia-mild, potassium is elevated at 5.4.  Would not give any Kayexalate at this time.  Hopefully will improve with IV hydration.  Check serum potassium levels in a.m.  3. Squamous cell carcinoma of the buccal mucosa-patient gets radiation treatment and chemotherapy at [facility].  She has  wound on the left cheek with dressing in place.  Continue with dressing changes 3 times a day  4. Acute kidney injury-mild, creatinine 1.79, started on aggressive IV hydration.  Follow BMP in am.  5. History of stroke-continue Plavix  6. Depression-continue Elavil, Lexapro, Xanax as needed   DVT Prophylaxis-   Lovenox   AM Labs Ordered, also please review Full Orders  Family Communication: Admission, patients condition and plan of care including tests being ordered have been discussed with the patient and her sons at bedside who indicate understanding and agree with the plan and Code Status.  Code Status: Full code  Admission status: Observation  Time spent in minutes : 60 minutes   Oswald Hillock M.D on 08/23/2018 at 9:46 PM  Between 7am to 7pm - Pager - [phone]. After 7pm go  to [email or web address] - password [facility]  [facility] - Office  [phone]

## 2018-08-23 NOTE — ED Triage Notes (Addendum)
Pt sent over by Dr. Gerarda Fraction. Reports he sent her here for hyperkalemia. Pt endorses generalized weakness for over a week.

## 2018-08-24 ENCOUNTER — Encounter (HOSPITAL_COMMUNITY): Payer: Self-pay | Admitting: *Deleted

## 2018-08-24 DIAGNOSIS — N289 Disorder of kidney and ureter, unspecified: Secondary | ICD-10-CM | POA: Diagnosis not present

## 2018-08-24 LAB — CBC
HCT: 34.7 % — ABNORMAL LOW (ref 36.0–46.0)
HEMOGLOBIN: 10.3 g/dL — AB (ref 12.0–15.0)
MCH: 27.7 pg (ref 26.0–34.0)
MCHC: 29.7 g/dL — ABNORMAL LOW (ref 30.0–36.0)
MCV: 93.3 fL (ref 80.0–100.0)
PLATELETS: 201 10*3/uL (ref 150–400)
RBC: 3.72 MIL/uL — AB (ref 3.87–5.11)
RDW: 13.6 % (ref 11.5–15.5)
WBC: 7.8 10*3/uL (ref 4.0–10.5)
nRBC: 0 % (ref 0.0–0.2)

## 2018-08-24 LAB — COMPREHENSIVE METABOLIC PANEL
ALK PHOS: 65 U/L (ref 38–126)
ALT: 11 U/L (ref 0–44)
AST: 13 U/L — AB (ref 15–41)
Albumin: 2.5 g/dL — ABNORMAL LOW (ref 3.5–5.0)
BILIRUBIN TOTAL: 0.2 mg/dL — AB (ref 0.3–1.2)
BUN: 11 mg/dL (ref 8–23)
CALCIUM: 11.5 mg/dL — AB (ref 8.9–10.3)
CO2: 25 mmol/L (ref 22–32)
CREATININE: 1.56 mg/dL — AB (ref 0.44–1.00)
Chloride: 112 mmol/L — ABNORMAL HIGH (ref 98–111)
GFR calc Af Amer: 36 mL/min — ABNORMAL LOW (ref >60)
GFR, EST NON AFRICAN AMERICAN: 31 mL/min — AB (ref >60)
Glucose, Bld: 92 mg/dL (ref 70–99)
Potassium: 5 mmol/L (ref 3.5–5.1)
SODIUM: 139 mmol/L (ref 135–145)
Total Protein: 5.4 g/dL — ABNORMAL LOW (ref 6.5–8.1)

## 2018-08-24 LAB — MRSA PCR SCREENING: MRSA BY PCR: NEGATIVE

## 2018-08-24 MED ORDER — PANTOPRAZOLE SODIUM 40 MG PO TBEC
40.0000 mg | DELAYED_RELEASE_TABLET | Freq: Every day | ORAL | Status: DC
  Administered 2018-08-24: 40 mg via ORAL
  Filled 2018-08-24: qty 1

## 2018-08-24 MED ORDER — HYDROCODONE-ACETAMINOPHEN 10-325 MG PO TABS: 0.5000 | ORAL_TABLET | Freq: Four times a day (QID) | ORAL | Status: DC | PRN

## 2018-08-24 MED ORDER — CYCLOBENZAPRINE HCL 10 MG PO TABS: 5.0000 mg | ORAL_TABLET | Freq: Every day | ORAL | Status: DC | PRN

## 2018-08-24 MED ORDER — AMITRIPTYLINE HCL 25 MG PO TABS
25.0000 mg | ORAL_TABLET | Freq: Every day | ORAL | Status: DC
  Administered 2018-08-24: 25 mg via ORAL
  Filled 2018-08-24: qty 1

## 2018-08-24 MED ORDER — ATORVASTATIN CALCIUM 20 MG PO TABS: 20.0000 mg | ORAL_TABLET | Freq: Every day | ORAL | Status: DC

## 2018-08-24 MED ORDER — CLOPIDOGREL BISULFATE 75 MG PO TABS
75.0000 mg | ORAL_TABLET | Freq: Every day | ORAL | Status: DC
  Administered 2018-08-24: 75 mg via ORAL
  Filled 2018-08-24: qty 1

## 2018-08-24 MED ORDER — ENOXAPARIN SODIUM 40 MG/0.4ML ~~LOC~~ SOLN: 40.0000 mg | SUBCUTANEOUS | Status: DC

## 2018-08-24 MED ORDER — SODIUM CHLORIDE 0.9 % IV SOLN
60.0000 mg | Freq: Once | INTRAVENOUS | Status: AC
  Administered 2018-08-24: 60 mg via INTRAVENOUS
  Filled 2018-08-24: qty 20

## 2018-08-24 MED ORDER — ONDANSETRON HCL 4 MG PO TABS: 4.0000 mg | ORAL_TABLET | Freq: Four times a day (QID) | ORAL | Status: DC | PRN

## 2018-08-24 MED ORDER — ENOXAPARIN SODIUM 30 MG/0.3ML ~~LOC~~ SOLN
30.0000 mg | SUBCUTANEOUS | Status: DC
  Administered 2018-08-24: 30 mg via SUBCUTANEOUS
  Filled 2018-08-24: qty 0.3

## 2018-08-24 MED ORDER — ONDANSETRON HCL 4 MG/2ML IJ SOLN: 4.0000 mg | Freq: Four times a day (QID) | INTRAMUSCULAR | Status: DC | PRN

## 2018-08-24 MED ORDER — ALPRAZOLAM 0.5 MG PO TABS: 0.5000 mg | ORAL_TABLET | Freq: Four times a day (QID) | ORAL | Status: DC | PRN

## 2018-08-24 MED ORDER — ESCITALOPRAM OXALATE 10 MG PO TABS
20.0000 mg | ORAL_TABLET | Freq: Every day | ORAL | Status: DC
  Administered 2018-08-24: 20 mg via ORAL
  Filled 2018-08-24: qty 2

## 2018-08-24 MED ORDER — RAMELTEON 8 MG PO TABS
8.0000 mg | ORAL_TABLET | Freq: Every day | ORAL | Status: DC
  Administered 2018-08-24: 8 mg via ORAL
  Filled 2018-08-24 (×2): qty 1

## 2018-08-24 MED ORDER — TRAMADOL HCL 50 MG PO TABS: 50.0000 mg | ORAL_TABLET | Freq: Two times a day (BID) | ORAL | Status: DC | PRN

## 2018-08-24 NOTE — Progress Notes (Signed)
Discharge instructions given to patient. Pt verbalized understanding. Pain medication returned to pt from med room.

## 2018-08-24 NOTE — Discharge Summary (Signed)
Physician Discharge Summary  [name] [name] ZTI:[id number] DOB: 05/18/1941 DOA: 08/23/2018  PCP: Redmond School, MD  Admit date: 08/23/2018 Discharge date: 08/24/2018  Time spent: 35 minutes  Recommendations for Outpatient Follow-up:  1. Repeat basic metabolic panel to follow electrolytes and renal function 2. Reassess blood pressure and adjust antihypertensive regimen as needed.   Discharge Diagnoses:  Hypercalcemia Acute on chronic renal failure superimposed on chronic kidney disease history 3 Prior history of stroke Squamous cell carcinoma of the bucca mucosa Depression and anxiety Essential hypertension  Discharge Condition: Stable and improved.  Patient discharged home with instruction to follow-up with PCP and oncology service as an outpatient.  Diet recommendation: Renal diet.  Filed Weights   08/23/18 1750 08/23/18 2300 08/24/18 0456  Weight: 68 kg 67.3 kg 67.5 kg    History of present illness:  As per H&P written by Dr. Darrick Meigs on 08/23/2018. 77 y.o. female, with history of squamous cell cancer of buccal mucosa getting chemo and radiation at [facility], depression,  stroke, hypertension was sent to the hospital by PCP for abnormal labs.  Patient was told to go to hospital for electrolyte abnormality.  In the ED patient was found to have hypercalcemia with calcium 13.0 and mild hyperkalemia with potassium 5.4. She denies any symptoms. Denies fever or chills. Denies abdominal pain. No mental status changes. Denies blurred vision or passing out. Denies chest pain, shortness of breath Denies nausea, vomiting, diarrhea  Hospital Course:  1-hypercalcemia: Secondary to malignancy and dehydration -Patient receive aggressive fluid resuscitation and also IV pamidronate -At discharge calcium around 10 range. -Continue outpatient follow-up with oncology service -Patient advised to keep herself well-hydrated. -Repeat basic metabolic panel as an outpatient to reassess  calcium level.  2-hyperkalemia: Mild -Patient potassium 5.4 most likely associated with ongoing oral supplementation and dehydration. -After fluid resuscitation potassium is back to normal -Will keep on her oral supplementation hold at discharge. -Repeat basic metabolic panel at follow-up visit to reassess electrolytes trend.  3-squamous cell carcinoma of the buccal mucosa -Patient following actively at [facility]. -Had received chemotherapy and radiation therapy -Continue ongoing wound care on her left cheek and follow-up with oncology services for further evaluation and management. -Depending her condition/progression of the disease she may be suitable for discussion about alternate route to guarantee nutrition and hydration.  4-mild acute kidney injury superimposed on chronic kidney disease (stage III at baseline) In the setting of dehydration -Resolved with fluid resuscitation -Patient advised to keep herself well-hydrated and to minimize the use of nephrotoxic agents (NSAIDs).  5-history of stroke -No new neurologic deficit appreciated -Continue Plavix for secondary prevention.  6-history of depression/anxiety -Flat affect on exam -No suicidal ideation no hallucination -Continue Elavil, Lexapro and as needed Xanax.  7-Essential hypertension -Continue home antihypertensive regimen -Blood pressure stable control.  Procedures:  None  Consultations:  None  Discharge Exam: Vitals:   08/24/18 1500 08/24/18 1555  BP: (!) 106/59   Pulse: 95 92  Resp: 11 16  Temp:  98.5 F (36.9 C)  SpO2: 96% 95%    General: Afebrile, no chest pain, no nausea, no vomiting.  Patient reports feeling back to her baseline.  And denies any acute distress. Cardiovascular: S1 and S2, no rubs, no gallops. Respiratory: Clear to auscultation bilaterally Abdomen: Soft, nontender, nondistended, positive bowel sounds Extremities: No edema, no cyanosis or clubbing. Skin: Active eroding squamous  cell carcinoma eroding on her left cheek and affecting upper and lower lips.  Discharge Instructions   Discharge Instructions  Diet - low sodium heart healthy   Complete by:  As directed    Discharge instructions   Complete by:  As directed      Allergies as of 08/24/2018      Reactions   Ciprofloxacin Anaphylaxis   Aspirin Nausea And Vomiting   Penicillins Nausea And Vomiting   Propoxyphene N-acetaminophen Nausea And Vomiting   Sulfamethoxazole Rash      Medication List    STOP taking these medications   amoxicillin-clavulanate 500-125 MG tablet Commonly known as:  AUGMENTIN   potassium chloride 20 MEQ packet Commonly known as:  KLOR-CON     TAKE these medications   acetaminophen 500 MG tablet Commonly known as:  TYLENOL Take 1 tablet (500 mg total) by mouth every 6 (six) hours as needed for mild pain.   albuterol 4 MG tablet Commonly known as:  PROVENTIL Take by mouth.   ALPRAZolam 0.5 MG tablet Commonly known as:  XANAX Take 1 tablet (0.5 mg total) by mouth 4 (four) times daily as needed for anxiety or sleep.   amitriptyline 25 MG tablet Commonly known as:  ELAVIL Take by mouth.   atorvastatin 20 MG tablet Commonly known as:  LIPITOR Take by mouth.   cholecalciferol 1000 units tablet Commonly known as:  VITAMIN D Take 1,000 Units by mouth daily. 2000 units daily   clopidogrel 75 MG tablet Commonly known as:  PLAVIX Take 1 tablet (75 mg total) by mouth daily.   cyclobenzaprine 5 MG tablet Commonly known as:  FLEXERIL Take 1 tablet by mouth daily.   escitalopram 20 MG tablet Commonly known as:  LEXAPRO Take 20 mg by mouth daily.   feeding supplement Liqd Take 1 Container by mouth 3 (three) times daily between meals.   HYDROcodone-acetaminophen 10-325 MG tablet Commonly known as:  NORCO Take 1 tablet by mouth every 6 (six) hours as needed for moderate pain. What changed:  how much to take   magnesium oxide 400 (241.3 Mg) MG  tablet Commonly known as:  MAG-OX Take 2 tablets by mouth 2 (two) times daily.   NEXIUM 40 MG capsule Generic drug:  esomeprazole   NITROSTAT 0.4 MG SL tablet Generic drug:  nitroGLYCERIN Place 0.4 mg under the tongue every 5 (five) minutes as needed for chest pain.   ondansetron 4 MG tablet Commonly known as:  ZOFRAN Take 1 tablet by mouth daily as needed.   ROZEREM 8 MG tablet Generic drug:  ramelteon Take 8 mg by mouth at bedtime.   traMADol 50 MG tablet Commonly known as:  ULTRAM Take 1 tablet by mouth as needed.      Allergies  Allergen Reactions  . Ciprofloxacin Anaphylaxis  . Aspirin Nausea And Vomiting  . Penicillins Nausea And Vomiting  . Propoxyphene N-Acetaminophen Nausea And Vomiting  . Sulfamethoxazole Rash   Follow-up Information    Redmond School, MD. Schedule an appointment as soon as possible for a visit in 10 day(s).   Specialty:  Internal Medicine Contact information: [address] [place] [address] [phone]        Arnoldo Lenis, MD .   Specialty:  Cardiology Contact information: [address] [place] [address] [phone]           The results of significant diagnostics from this hospitalization (including imaging, microbiology, ancillary and laboratory) are listed below for reference.    Significant Diagnostic Studies: Dg Lumbar Spine 2-3 Views  Result Date: 08/23/2018 CLINICAL DATA:  Low back pain. Posttraumatic left  sacroiliac joint pain. The patient fell several weeks ago. EXAM: LUMBAR SPINE - 2-3 VIEW COMPARISON:  CT scan of the abdomen and pelvis dated 12/22/2014 FINDINGS: There is no evidence of lumbar spine fracture. Alignment is normal. There is slight degeneration of the L5-S1 disc. The other disc spaces appear normal. Diffuse osteopenia. IMPRESSION: No acute abnormality. Slight chronic degenerative changes of the L5-S1 disc. Electronically Signed   By: Lorriane Shire M.D.   On: 08/23/2018 16:56     Microbiology: Recent Results (from the past 240 hour(s))  MRSA PCR Screening     Status: None   Collection Time: 08/23/18 11:35 PM  Result Value Ref Range Status   MRSA by PCR NEGATIVE NEGATIVE Final    Comment:        The GeneXpert MRSA Assay (FDA approved for NASAL specimens only), is one component of a comprehensive MRSA colonization surveillance program. It is not intended to diagnose MRSA infection nor to guide or monitor treatment for MRSA infections. Performed at [facility], [address] [place], [place] [address]      Labs: Basic Metabolic Panel: Recent Labs  Lab 08/23/18 1824 08/24/18 0400  NA 136 139  K 5.4* 5.0  CL 102 112*  CO2 26 25  GLUCOSE 102* 92  BUN 12 11  CREATININE 1.79* 1.56*  CALCIUM 13.0* 11.5*   Liver Function Tests: Recent Labs  Lab 08/23/18 1824 08/24/18 0400  AST 17 13*  ALT 15 11  ALKPHOS 93 65  BILITOT 0.2* 0.2*  PROT 7.8 5.4*  ALBUMIN 3.6 2.5*   CBC: Recent Labs  Lab 08/23/18 1817 08/24/18 0400  WBC 11.9* 7.8  HGB 13.3 10.3*  HCT 44.7 34.7*  MCV 92.0 93.3  PLT 281 201    Signed:  Barton Dubois MD.  [facility] 08/24/2018, 5:12 PM

## 2018-08-25 DIAGNOSIS — E1122 Type 2 diabetes mellitus with diabetic chronic kidney disease: Secondary | ICD-10-CM | POA: Diagnosis not present

## 2018-08-25 DIAGNOSIS — Z7902 Long term (current) use of antithrombotics/antiplatelets: Secondary | ICD-10-CM | POA: Diagnosis not present

## 2018-08-25 DIAGNOSIS — W19XXXD Unspecified fall, subsequent encounter: Secondary | ICD-10-CM | POA: Diagnosis not present

## 2018-08-25 DIAGNOSIS — M25512 Pain in left shoulder: Secondary | ICD-10-CM | POA: Diagnosis not present

## 2018-08-25 DIAGNOSIS — R531 Weakness: Secondary | ICD-10-CM | POA: Diagnosis not present

## 2018-08-25 DIAGNOSIS — Z8673 Personal history of transient ischemic attack (TIA), and cerebral infarction without residual deficits: Secondary | ICD-10-CM | POA: Diagnosis not present

## 2018-08-25 DIAGNOSIS — D72829 Elevated white blood cell count, unspecified: Secondary | ICD-10-CM | POA: Diagnosis not present

## 2018-08-25 DIAGNOSIS — I2510 Atherosclerotic heart disease of native coronary artery without angina pectoris: Secondary | ICD-10-CM | POA: Diagnosis not present

## 2018-08-25 DIAGNOSIS — E440 Moderate protein-calorie malnutrition: Secondary | ICD-10-CM | POA: Diagnosis not present

## 2018-08-25 DIAGNOSIS — N184 Chronic kidney disease, stage 4 (severe): Secondary | ICD-10-CM | POA: Diagnosis not present

## 2018-08-25 DIAGNOSIS — J449 Chronic obstructive pulmonary disease, unspecified: Secondary | ICD-10-CM | POA: Diagnosis not present

## 2018-08-25 DIAGNOSIS — M25561 Pain in right knee: Secondary | ICD-10-CM | POA: Diagnosis not present

## 2018-08-25 DIAGNOSIS — C069 Malignant neoplasm of mouth, unspecified: Secondary | ICD-10-CM | POA: Diagnosis not present

## 2018-08-25 DIAGNOSIS — Z79891 Long term (current) use of opiate analgesic: Secondary | ICD-10-CM | POA: Diagnosis not present

## 2018-08-25 DIAGNOSIS — I129 Hypertensive chronic kidney disease with stage 1 through stage 4 chronic kidney disease, or unspecified chronic kidney disease: Secondary | ICD-10-CM | POA: Diagnosis not present

## 2018-08-29 DIAGNOSIS — J449 Chronic obstructive pulmonary disease, unspecified: Secondary | ICD-10-CM | POA: Diagnosis not present

## 2018-08-29 DIAGNOSIS — E119 Type 2 diabetes mellitus without complications: Secondary | ICD-10-CM | POA: Diagnosis not present

## 2018-08-29 DIAGNOSIS — D72829 Elevated white blood cell count, unspecified: Secondary | ICD-10-CM | POA: Diagnosis not present

## 2018-08-29 DIAGNOSIS — E1122 Type 2 diabetes mellitus with diabetic chronic kidney disease: Secondary | ICD-10-CM | POA: Diagnosis not present

## 2018-08-29 DIAGNOSIS — Z8673 Personal history of transient ischemic attack (TIA), and cerebral infarction without residual deficits: Secondary | ICD-10-CM | POA: Diagnosis not present

## 2018-08-29 DIAGNOSIS — I129 Hypertensive chronic kidney disease with stage 1 through stage 4 chronic kidney disease, or unspecified chronic kidney disease: Secondary | ICD-10-CM | POA: Diagnosis not present

## 2018-08-29 DIAGNOSIS — E440 Moderate protein-calorie malnutrition: Secondary | ICD-10-CM | POA: Diagnosis not present

## 2018-08-29 DIAGNOSIS — R531 Weakness: Secondary | ICD-10-CM | POA: Diagnosis not present

## 2018-08-29 DIAGNOSIS — W19XXXD Unspecified fall, subsequent encounter: Secondary | ICD-10-CM | POA: Diagnosis not present

## 2018-08-29 DIAGNOSIS — Z7902 Long term (current) use of antithrombotics/antiplatelets: Secondary | ICD-10-CM | POA: Diagnosis not present

## 2018-08-29 DIAGNOSIS — Z79891 Long term (current) use of opiate analgesic: Secondary | ICD-10-CM | POA: Diagnosis not present

## 2018-08-29 DIAGNOSIS — N184 Chronic kidney disease, stage 4 (severe): Secondary | ICD-10-CM | POA: Diagnosis not present

## 2018-08-29 DIAGNOSIS — I2510 Atherosclerotic heart disease of native coronary artery without angina pectoris: Secondary | ICD-10-CM | POA: Diagnosis not present

## 2018-08-29 DIAGNOSIS — Z933 Colostomy status: Secondary | ICD-10-CM | POA: Diagnosis not present

## 2018-08-29 DIAGNOSIS — M25512 Pain in left shoulder: Secondary | ICD-10-CM | POA: Diagnosis not present

## 2018-08-29 DIAGNOSIS — C069 Malignant neoplasm of mouth, unspecified: Secondary | ICD-10-CM | POA: Diagnosis not present

## 2018-08-29 DIAGNOSIS — M25561 Pain in right knee: Secondary | ICD-10-CM | POA: Diagnosis not present

## 2018-08-31 DIAGNOSIS — I2510 Atherosclerotic heart disease of native coronary artery without angina pectoris: Secondary | ICD-10-CM | POA: Diagnosis not present

## 2018-08-31 DIAGNOSIS — N184 Chronic kidney disease, stage 4 (severe): Secondary | ICD-10-CM | POA: Diagnosis not present

## 2018-08-31 DIAGNOSIS — M25561 Pain in right knee: Secondary | ICD-10-CM | POA: Diagnosis not present

## 2018-08-31 DIAGNOSIS — Z79891 Long term (current) use of opiate analgesic: Secondary | ICD-10-CM | POA: Diagnosis not present

## 2018-08-31 DIAGNOSIS — Z7902 Long term (current) use of antithrombotics/antiplatelets: Secondary | ICD-10-CM | POA: Diagnosis not present

## 2018-08-31 DIAGNOSIS — W19XXXD Unspecified fall, subsequent encounter: Secondary | ICD-10-CM | POA: Diagnosis not present

## 2018-08-31 DIAGNOSIS — E1122 Type 2 diabetes mellitus with diabetic chronic kidney disease: Secondary | ICD-10-CM | POA: Diagnosis not present

## 2018-08-31 DIAGNOSIS — C069 Malignant neoplasm of mouth, unspecified: Secondary | ICD-10-CM | POA: Diagnosis not present

## 2018-08-31 DIAGNOSIS — D72829 Elevated white blood cell count, unspecified: Secondary | ICD-10-CM | POA: Diagnosis not present

## 2018-08-31 DIAGNOSIS — J449 Chronic obstructive pulmonary disease, unspecified: Secondary | ICD-10-CM | POA: Diagnosis not present

## 2018-08-31 DIAGNOSIS — Z8673 Personal history of transient ischemic attack (TIA), and cerebral infarction without residual deficits: Secondary | ICD-10-CM | POA: Diagnosis not present

## 2018-08-31 DIAGNOSIS — M25512 Pain in left shoulder: Secondary | ICD-10-CM | POA: Diagnosis not present

## 2018-08-31 DIAGNOSIS — I129 Hypertensive chronic kidney disease with stage 1 through stage 4 chronic kidney disease, or unspecified chronic kidney disease: Secondary | ICD-10-CM | POA: Diagnosis not present

## 2018-08-31 DIAGNOSIS — E440 Moderate protein-calorie malnutrition: Secondary | ICD-10-CM | POA: Diagnosis not present

## 2018-08-31 DIAGNOSIS — R531 Weakness: Secondary | ICD-10-CM | POA: Diagnosis not present

## 2018-09-04 DIAGNOSIS — K1321 Leukoplakia of oral mucosa, including tongue: Secondary | ICD-10-CM | POA: Diagnosis not present

## 2018-09-04 DIAGNOSIS — C069 Malignant neoplasm of mouth, unspecified: Secondary | ICD-10-CM | POA: Diagnosis not present

## 2018-09-04 DIAGNOSIS — G894 Chronic pain syndrome: Secondary | ICD-10-CM | POA: Diagnosis not present

## 2018-09-06 DIAGNOSIS — Z7902 Long term (current) use of antithrombotics/antiplatelets: Secondary | ICD-10-CM | POA: Diagnosis not present

## 2018-09-06 DIAGNOSIS — M25512 Pain in left shoulder: Secondary | ICD-10-CM | POA: Diagnosis not present

## 2018-09-06 DIAGNOSIS — R531 Weakness: Secondary | ICD-10-CM | POA: Diagnosis not present

## 2018-09-06 DIAGNOSIS — E1122 Type 2 diabetes mellitus with diabetic chronic kidney disease: Secondary | ICD-10-CM | POA: Diagnosis not present

## 2018-09-06 DIAGNOSIS — M25561 Pain in right knee: Secondary | ICD-10-CM | POA: Diagnosis not present

## 2018-09-06 DIAGNOSIS — N184 Chronic kidney disease, stage 4 (severe): Secondary | ICD-10-CM | POA: Diagnosis not present

## 2018-09-06 DIAGNOSIS — W19XXXD Unspecified fall, subsequent encounter: Secondary | ICD-10-CM | POA: Diagnosis not present

## 2018-09-06 DIAGNOSIS — J449 Chronic obstructive pulmonary disease, unspecified: Secondary | ICD-10-CM | POA: Diagnosis not present

## 2018-09-06 DIAGNOSIS — Z79891 Long term (current) use of opiate analgesic: Secondary | ICD-10-CM | POA: Diagnosis not present

## 2018-09-06 DIAGNOSIS — I129 Hypertensive chronic kidney disease with stage 1 through stage 4 chronic kidney disease, or unspecified chronic kidney disease: Secondary | ICD-10-CM | POA: Diagnosis not present

## 2018-09-06 DIAGNOSIS — E440 Moderate protein-calorie malnutrition: Secondary | ICD-10-CM | POA: Diagnosis not present

## 2018-09-06 DIAGNOSIS — I2510 Atherosclerotic heart disease of native coronary artery without angina pectoris: Secondary | ICD-10-CM | POA: Diagnosis not present

## 2018-09-06 DIAGNOSIS — C069 Malignant neoplasm of mouth, unspecified: Secondary | ICD-10-CM | POA: Diagnosis not present

## 2018-09-06 DIAGNOSIS — D72829 Elevated white blood cell count, unspecified: Secondary | ICD-10-CM | POA: Diagnosis not present

## 2018-09-06 DIAGNOSIS — Z8673 Personal history of transient ischemic attack (TIA), and cerebral infarction without residual deficits: Secondary | ICD-10-CM | POA: Diagnosis not present

## 2018-09-08 DIAGNOSIS — J449 Chronic obstructive pulmonary disease, unspecified: Secondary | ICD-10-CM | POA: Diagnosis not present

## 2018-09-08 DIAGNOSIS — E440 Moderate protein-calorie malnutrition: Secondary | ICD-10-CM | POA: Diagnosis not present

## 2018-09-08 DIAGNOSIS — C069 Malignant neoplasm of mouth, unspecified: Secondary | ICD-10-CM | POA: Diagnosis not present

## 2018-09-08 DIAGNOSIS — D72829 Elevated white blood cell count, unspecified: Secondary | ICD-10-CM | POA: Diagnosis not present

## 2018-09-08 DIAGNOSIS — N184 Chronic kidney disease, stage 4 (severe): Secondary | ICD-10-CM | POA: Diagnosis not present

## 2018-09-08 DIAGNOSIS — Z7902 Long term (current) use of antithrombotics/antiplatelets: Secondary | ICD-10-CM | POA: Diagnosis not present

## 2018-09-08 DIAGNOSIS — R531 Weakness: Secondary | ICD-10-CM | POA: Diagnosis not present

## 2018-09-08 DIAGNOSIS — W19XXXD Unspecified fall, subsequent encounter: Secondary | ICD-10-CM | POA: Diagnosis not present

## 2018-09-08 DIAGNOSIS — M25561 Pain in right knee: Secondary | ICD-10-CM | POA: Diagnosis not present

## 2018-09-08 DIAGNOSIS — Z79891 Long term (current) use of opiate analgesic: Secondary | ICD-10-CM | POA: Diagnosis not present

## 2018-09-08 DIAGNOSIS — E1122 Type 2 diabetes mellitus with diabetic chronic kidney disease: Secondary | ICD-10-CM | POA: Diagnosis not present

## 2018-09-08 DIAGNOSIS — Z8673 Personal history of transient ischemic attack (TIA), and cerebral infarction without residual deficits: Secondary | ICD-10-CM | POA: Diagnosis not present

## 2018-09-08 DIAGNOSIS — M25512 Pain in left shoulder: Secondary | ICD-10-CM | POA: Diagnosis not present

## 2018-09-08 DIAGNOSIS — I129 Hypertensive chronic kidney disease with stage 1 through stage 4 chronic kidney disease, or unspecified chronic kidney disease: Secondary | ICD-10-CM | POA: Diagnosis not present

## 2018-09-08 DIAGNOSIS — I2510 Atherosclerotic heart disease of native coronary artery without angina pectoris: Secondary | ICD-10-CM | POA: Diagnosis not present

## 2018-09-12 DIAGNOSIS — E875 Hyperkalemia: Secondary | ICD-10-CM | POA: Diagnosis not present

## 2018-09-12 DIAGNOSIS — C0689 Malignant neoplasm of overlapping sites of other parts of mouth: Secondary | ICD-10-CM | POA: Diagnosis not present

## 2018-09-12 DIAGNOSIS — J449 Chronic obstructive pulmonary disease, unspecified: Secondary | ICD-10-CM | POA: Diagnosis not present

## 2018-09-12 DIAGNOSIS — C069 Malignant neoplasm of mouth, unspecified: Secondary | ICD-10-CM | POA: Diagnosis not present

## 2018-09-12 DIAGNOSIS — N184 Chronic kidney disease, stage 4 (severe): Secondary | ICD-10-CM | POA: Diagnosis not present

## 2018-09-12 DIAGNOSIS — Z79899 Other long term (current) drug therapy: Secondary | ICD-10-CM | POA: Diagnosis not present

## 2018-09-12 DIAGNOSIS — G893 Neoplasm related pain (acute) (chronic): Secondary | ICD-10-CM | POA: Diagnosis not present

## 2018-09-20 ENCOUNTER — Inpatient Hospital Stay (HOSPITAL_COMMUNITY)
Admission: EM | Admit: 2018-09-20 | Discharge: 2018-09-22 | DRG: 146 | Disposition: A | Discharge status: Hospice | Payer: Medicare Other | Attending: Internal Medicine | Admitting: Internal Medicine

## 2018-09-20 ENCOUNTER — Encounter (HOSPITAL_COMMUNITY): Payer: Self-pay

## 2018-09-20 ENCOUNTER — Other Ambulatory Visit: Payer: Self-pay

## 2018-09-20 DIAGNOSIS — L0889 Other specified local infections of the skin and subcutaneous tissue: Secondary | ICD-10-CM | POA: Diagnosis present

## 2018-09-20 DIAGNOSIS — E785 Hyperlipidemia, unspecified: Secondary | ICD-10-CM | POA: Diagnosis present

## 2018-09-20 DIAGNOSIS — F329 Major depressive disorder, single episode, unspecified: Secondary | ICD-10-CM | POA: Diagnosis present

## 2018-09-20 DIAGNOSIS — I9589 Other hypotension: Secondary | ICD-10-CM | POA: Diagnosis not present

## 2018-09-20 DIAGNOSIS — Z8673 Personal history of transient ischemic attack (TIA), and cerebral infarction without residual deficits: Secondary | ICD-10-CM | POA: Diagnosis not present

## 2018-09-20 DIAGNOSIS — I252 Old myocardial infarction: Secondary | ICD-10-CM

## 2018-09-20 DIAGNOSIS — L03211 Cellulitis of face: Secondary | ICD-10-CM | POA: Diagnosis not present

## 2018-09-20 DIAGNOSIS — Z1389 Encounter for screening for other disorder: Secondary | ICD-10-CM | POA: Diagnosis not present

## 2018-09-20 DIAGNOSIS — Z9049 Acquired absence of other specified parts of digestive tract: Secondary | ICD-10-CM

## 2018-09-20 DIAGNOSIS — E1122 Type 2 diabetes mellitus with diabetic chronic kidney disease: Secondary | ICD-10-CM | POA: Diagnosis not present

## 2018-09-20 DIAGNOSIS — Z515 Encounter for palliative care: Secondary | ICD-10-CM | POA: Diagnosis not present

## 2018-09-20 DIAGNOSIS — C060 Malignant neoplasm of cheek mucosa: Principal | ICD-10-CM

## 2018-09-20 DIAGNOSIS — Z882 Allergy status to sulfonamides status: Secondary | ICD-10-CM

## 2018-09-20 DIAGNOSIS — I129 Hypertensive chronic kidney disease with stage 1 through stage 4 chronic kidney disease, or unspecified chronic kidney disease: Secondary | ICD-10-CM | POA: Diagnosis present

## 2018-09-20 DIAGNOSIS — K219 Gastro-esophageal reflux disease without esophagitis: Secondary | ICD-10-CM | POA: Diagnosis present

## 2018-09-20 DIAGNOSIS — Z6827 Body mass index (BMI) 27.0-27.9, adult: Secondary | ICD-10-CM

## 2018-09-20 DIAGNOSIS — Z7189 Other specified counseling: Secondary | ICD-10-CM

## 2018-09-20 DIAGNOSIS — I959 Hypotension, unspecified: Secondary | ICD-10-CM | POA: Diagnosis not present

## 2018-09-20 DIAGNOSIS — F419 Anxiety disorder, unspecified: Secondary | ICD-10-CM | POA: Diagnosis present

## 2018-09-20 DIAGNOSIS — T148XXA Other injury of unspecified body region, initial encounter: Secondary | ICD-10-CM

## 2018-09-20 DIAGNOSIS — Z886 Allergy status to analgesic agent status: Secondary | ICD-10-CM | POA: Diagnosis not present

## 2018-09-20 DIAGNOSIS — Z79891 Long term (current) use of opiate analgesic: Secondary | ICD-10-CM

## 2018-09-20 DIAGNOSIS — Z87891 Personal history of nicotine dependence: Secondary | ICD-10-CM

## 2018-09-20 DIAGNOSIS — N183 Chronic kidney disease, stage 3 (moderate): Secondary | ICD-10-CM | POA: Diagnosis not present

## 2018-09-20 DIAGNOSIS — R1310 Dysphagia, unspecified: Secondary | ICD-10-CM | POA: Diagnosis present

## 2018-09-20 DIAGNOSIS — L089 Local infection of the skin and subcutaneous tissue, unspecified: Secondary | ICD-10-CM | POA: Diagnosis not present

## 2018-09-20 DIAGNOSIS — G893 Neoplasm related pain (acute) (chronic): Secondary | ICD-10-CM | POA: Diagnosis present

## 2018-09-20 DIAGNOSIS — R5383 Other fatigue: Secondary | ICD-10-CM | POA: Diagnosis not present

## 2018-09-20 DIAGNOSIS — R339 Retention of urine, unspecified: Secondary | ICD-10-CM | POA: Diagnosis not present

## 2018-09-20 DIAGNOSIS — S0180XA Unspecified open wound of other part of head, initial encounter: Secondary | ICD-10-CM

## 2018-09-20 DIAGNOSIS — Z888 Allergy status to other drugs, medicaments and biological substances status: Secondary | ICD-10-CM

## 2018-09-20 DIAGNOSIS — Z881 Allergy status to other antibiotic agents status: Secondary | ICD-10-CM

## 2018-09-20 DIAGNOSIS — N189 Chronic kidney disease, unspecified: Secondary | ICD-10-CM | POA: Diagnosis present

## 2018-09-20 DIAGNOSIS — N184 Chronic kidney disease, stage 4 (severe): Secondary | ICD-10-CM | POA: Diagnosis not present

## 2018-09-20 DIAGNOSIS — Z880 Allergy status to penicillin: Secondary | ICD-10-CM

## 2018-09-20 DIAGNOSIS — E860 Dehydration: Secondary | ICD-10-CM | POA: Diagnosis not present

## 2018-09-20 DIAGNOSIS — Z833 Family history of diabetes mellitus: Secondary | ICD-10-CM

## 2018-09-20 DIAGNOSIS — E43 Unspecified severe protein-calorie malnutrition: Secondary | ICD-10-CM | POA: Diagnosis not present

## 2018-09-20 DIAGNOSIS — Z7902 Long term (current) use of antithrombotics/antiplatelets: Secondary | ICD-10-CM

## 2018-09-20 DIAGNOSIS — Z79899 Other long term (current) drug therapy: Secondary | ICD-10-CM

## 2018-09-20 DIAGNOSIS — Z66 Do not resuscitate: Secondary | ICD-10-CM | POA: Diagnosis not present

## 2018-09-20 DIAGNOSIS — Z90710 Acquired absence of both cervix and uterus: Secondary | ICD-10-CM

## 2018-09-20 DIAGNOSIS — Z825 Family history of asthma and other chronic lower respiratory diseases: Secondary | ICD-10-CM

## 2018-09-20 LAB — CBC WITH DIFFERENTIAL/PLATELET
Abs Immature Granulocytes: 0.03 10*3/uL (ref 0.00–0.07)
Basophils Absolute: 0.1 10*3/uL (ref 0.0–0.1)
Basophils Relative: 1 %
EOS ABS: 0.2 10*3/uL (ref 0.0–0.5)
EOS PCT: 3 %
HEMATOCRIT: 42.9 % (ref 36.0–46.0)
HEMOGLOBIN: 13.1 g/dL (ref 12.0–15.0)
Immature Granulocytes: 0 %
LYMPHS ABS: 1.3 10*3/uL (ref 0.7–4.0)
Lymphocytes Relative: 14 %
MCH: 28.5 pg (ref 26.0–34.0)
MCHC: 30.5 g/dL (ref 30.0–36.0)
MCV: 93.3 fL (ref 80.0–100.0)
MONO ABS: 0.8 10*3/uL (ref 0.1–1.0)
Monocytes Relative: 9 %
NRBC: 0 % (ref 0.0–0.2)
Neutro Abs: 6.7 10*3/uL (ref 1.7–7.7)
Neutrophils Relative %: 73 %
Platelets: 288 10*3/uL (ref 150–400)
RBC: 4.6 MIL/uL (ref 3.87–5.11)
RDW: 14 % (ref 11.5–15.5)
WBC: 9.1 10*3/uL (ref 4.0–10.5)

## 2018-09-20 LAB — COMPREHENSIVE METABOLIC PANEL
ALK PHOS: 87 U/L (ref 38–126)
ALT: 9 U/L (ref 0–44)
AST: 12 U/L — AB (ref 15–41)
Albumin: 3 g/dL — ABNORMAL LOW (ref 3.5–5.0)
Anion gap: 6 (ref 5–15)
BUN: 17 mg/dL (ref 8–23)
CALCIUM: 10.1 mg/dL (ref 8.9–10.3)
CO2: 26 mmol/L (ref 22–32)
CREATININE: 1.45 mg/dL — AB (ref 0.44–1.00)
Chloride: 110 mmol/L (ref 98–111)
GFR, EST AFRICAN AMERICAN: 39 mL/min — AB (ref >60)
GFR, EST NON AFRICAN AMERICAN: 34 mL/min — AB (ref >60)
Glucose, Bld: 90 mg/dL (ref 70–99)
Potassium: 4.1 mmol/L (ref 3.5–5.1)
Sodium: 142 mmol/L (ref 135–145)
Total Bilirubin: 0.6 mg/dL (ref 0.3–1.2)
Total Protein: 7 g/dL (ref 6.5–8.1)

## 2018-09-20 LAB — I-STAT CG4 LACTIC ACID, ED: Lactic Acid, Venous: 0.92 mmol/L (ref 0.5–1.9)

## 2018-09-20 LAB — GLUCOSE, CAPILLARY: Glucose-Capillary: 75 mg/dL (ref 70–99)

## 2018-09-20 LAB — PHOSPHORUS: PHOSPHORUS: 2.9 mg/dL (ref 2.5–4.6)

## 2018-09-20 LAB — MAGNESIUM: Magnesium: 2.5 mg/dL — ABNORMAL HIGH (ref 1.7–2.4)

## 2018-09-20 MED ORDER — MORPHINE SULFATE (CONCENTRATE) 10 MG/0.5ML PO SOLN: 10.0000 mg | ORAL | Status: DC | PRN

## 2018-09-20 MED ORDER — VANCOMYCIN HCL IN DEXTROSE 750-5 MG/150ML-% IV SOLN
750.0000 mg | INTRAVENOUS | Status: DC
  Administered 2018-09-21 – 2018-09-22 (×2): 750 mg via INTRAVENOUS
  Filled 2018-09-20 (×5): qty 150

## 2018-09-20 MED ORDER — PANTOPRAZOLE SODIUM 40 MG PO TBEC
40.0000 mg | DELAYED_RELEASE_TABLET | Freq: Every day | ORAL | Status: DC
  Administered 2018-09-21 – 2018-09-22 (×2): 40 mg via ORAL
  Filled 2018-09-20 (×2): qty 1

## 2018-09-20 MED ORDER — ESCITALOPRAM OXALATE 10 MG PO TABS
20.0000 mg | ORAL_TABLET | Freq: Every day | ORAL | Status: DC
  Administered 2018-09-21 – 2018-09-22 (×2): 20 mg via ORAL
  Filled 2018-09-20 (×2): qty 2

## 2018-09-20 MED ORDER — SODIUM CHLORIDE 0.9 % IV SOLN
INTRAVENOUS | Status: DC
  Administered 2018-09-20 – 2018-09-22 (×4): via INTRAVENOUS

## 2018-09-20 MED ORDER — ONDANSETRON HCL 4 MG PO TABS: 4.0000 mg | ORAL_TABLET | Freq: Four times a day (QID) | ORAL | Status: DC | PRN

## 2018-09-20 MED ORDER — BOOST / RESOURCE BREEZE PO LIQD
1.0000 | Freq: Three times a day (TID) | ORAL | Status: DC
  Filled 2018-09-20 (×3): qty 1

## 2018-09-20 MED ORDER — TRAMADOL HCL 50 MG PO TABS: 50.0000 mg | ORAL_TABLET | Freq: Two times a day (BID) | ORAL | Status: DC | PRN

## 2018-09-20 MED ORDER — ONDANSETRON HCL 4 MG/2ML IJ SOLN: 4.0000 mg | Freq: Four times a day (QID) | INTRAMUSCULAR | Status: DC | PRN

## 2018-09-20 MED ORDER — ACETAMINOPHEN 325 MG PO TABS
650.0000 mg | ORAL_TABLET | Freq: Four times a day (QID) | ORAL | Status: DC | PRN
  Administered 2018-09-21: 650 mg via ORAL
  Filled 2018-09-20: qty 2

## 2018-09-20 MED ORDER — AMITRIPTYLINE HCL 25 MG PO TABS
25.0000 mg | ORAL_TABLET | Freq: Every day | ORAL | Status: DC
  Administered 2018-09-20 – 2018-09-21 (×2): 25 mg via ORAL
  Filled 2018-09-20 (×2): qty 1

## 2018-09-20 MED ORDER — TRAMADOL HCL 50 MG PO TABS: 50.0000 mg | ORAL_TABLET | Freq: Four times a day (QID) | ORAL | Status: DC | PRN

## 2018-09-20 MED ORDER — SODIUM CHLORIDE 0.9 % IV BOLUS
500.0000 mL | Freq: Once | INTRAVENOUS | Status: AC
  Administered 2018-09-20: 500 mL via INTRAVENOUS

## 2018-09-20 MED ORDER — HYDROGEN PEROXIDE 3 % EX SOLN
Freq: Two times a day (BID) | CUTANEOUS | Status: DC
  Administered 2018-09-21 – 2018-09-22 (×4): via TOPICAL
  Filled 2018-09-20: qty 473

## 2018-09-20 MED ORDER — VANCOMYCIN HCL IN DEXTROSE 1-5 GM/200ML-% IV SOLN
1000.0000 mg | Freq: Once | INTRAVENOUS | Status: AC
  Administered 2018-09-20: 1000 mg via INTRAVENOUS
  Filled 2018-09-20: qty 200

## 2018-09-20 MED ORDER — INSULIN ASPART 100 UNIT/ML ~~LOC~~ SOLN
0.0000 [IU] | Freq: Three times a day (TID) | SUBCUTANEOUS | Status: DC
  Administered 2018-09-21 – 2018-09-22 (×2): 1 [IU] via SUBCUTANEOUS

## 2018-09-20 NOTE — Progress Notes (Signed)
If anything happens to the patient or family is needed during the night, please call Daughter at [phone].

## 2018-09-20 NOTE — Progress Notes (Signed)
Palliative:  I have received palliative consult from Dr. Vanita Panda. I have spoken with Dr. Vanita Panda and Dr. Dyann Kief as I reached out to family via telephone (daughter [name] [name] [phone]) but their impression was that oncology has given up on [name] [name] and they are not accepting of this fact. [name] [name] is currently home with a fever herself. Optimal plan is to admit patient overnight for observation until I can see and speak with patient in the morning and better delineate GOC. Thank you for the consult and I apologize for the delay.   No charge  Vinie Sill, NP Palliative Medicine Team Pager # [phone] (M-F 8a-5p) Team Phone # [phone] (Nights/Weekends)

## 2018-09-20 NOTE — H&P (Signed)
.  History and Physical    [name] [name] KDX:[id number] DOB: [birth year] DOA: 09/20/2018  Referring MD/NP/PA: Dr. Vanita Panda  PCP: Redmond School, MD  Patient coming from: Home  Chief Complaint: generalized weakness, suppurative/malodurus left cheek wound.  HPI: [name] [name] is a 77 y.o. 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 Patient apparently release from oncology service after failing treatment with chemotherapy; advise to pursuit comfort care only. Patient is not having any fever or chills, but is experiencing worsening pain in her left cheek wound and is actively having purulent discharge and malodorous smell. She has not being able to eat or drink properly in the last couple days due to ongoing pain. During visit at her PCP BP was soft and patient felt to be dehydrated.   In ED, SBP in low 90's initially, no fever and normal WBC's. Renal function and rest of electrolytes at baseline. IVF initiated, IV antibiotics started and palliative care consulted. TRH contacted to place in observation for further evaluation and management.  Past Medical/Surgical History: Past Medical History:  Diagnosis Date  . Anxiety   . Cancer ([place])    left facial  . Depression   . Diabetes mellitus without complication ([place])   . GERD (gastroesophageal reflux disease)   . Hyperlipidemia   . Hypertension   . Myocardial infarction ([place])   . Renal disorder    kidney failure   . Stroke [facility])    times 2    Past Surgical History:  Procedure Laterality Date  . ABDOMINAL HYSTERECTOMY    . CATARACT EXTRACTION W/PHACO Right 03/20/2013   Procedure: CATARACT EXTRACTION PHACO AND INTRAOCULAR LENS PLACEMENT (IOC);  Surgeon: Elta Guadeloupe T. Gershon Crane,  MD;  Location: [facility];  Service: Ophthalmology;  Laterality: Right;  CDE:  6.29   . CHOLECYSTECTOMY    . COLOSTOMY    . OOPHORECTOMY    . TONSILLECTOMY      Social History:  reports that she quit smoking about 46 years ago. Her smoking use included cigarettes. She started smoking about 61 years ago. She has a 3.75 pack-year smoking history. Her smokeless tobacco use includes snuff. She reports that she does not drink alcohol or use drugs.  Allergies: Allergies  Allergen Reactions  . Ciprofloxacin Anaphylaxis  . Aspirin Nausea And Vomiting  . Penicillins Nausea And Vomiting  . Propoxyphene N-Acetaminophen Nausea And Vomiting  . Sulfamethoxazole Rash    Family History:  Family History  Problem Relation Age of Onset  . Cancer Other   . Diabetes Other   . Abnormal EKG Other   . Arthritis Other   . Asthma Other   . Kidney disease Other     Prior to Admission medications   Medication Sig Start Date End Date Taking? Authorizing Provider  acetaminophen (TYLENOL) 500 MG tablet Take 1 tablet (500 mg total) by mouth every 6 (six) hours as needed for mild pain. 12/30/14   Samuella Cota, MD  albuterol (PROVENTIL) 4 MG tablet Take by mouth. 07/03/12   [provider]  ALPRAZolam Duanne Moron) 1 MG tablet  09/05/18   [provider]  amitriptyline (ELAVIL) 25 MG tablet Take by mouth. 12/21/11   [provider]  atorvastatin (LIPITOR) 20 MG tablet Take by mouth. 12/21/11  [provider]  cholecalciferol (VITAMIN D) 1000 units tablet Take 1,000 Units by mouth daily. 2000 units daily    [provider]  clopidogrel (PLAVIX) 75 MG tablet Take 1 tablet (75 mg total) by mouth daily. 09/26/12   Wall, Marijo Conception, MD  cyclobenzaprine (FLEXERIL) 5 MG tablet Take 1 tablet by mouth daily. 08/07/18   [provider]  escitalopram (LEXAPRO) 20 MG tablet Take 20 mg by mouth daily.    [provider]  esomeprazole (NEXIUM) 40 MG capsule  12/21/11    [provider]  feeding supplement, RESOURCE BREEZE, (RESOURCE BREEZE) LIQD Take 1 Container by mouth 3 (three) times daily between meals. 12/30/14   Samuella Cota, MD  magnesium oxide (MAG-OX) 400 (241.3 Mg) MG tablet Take 2 tablets by mouth 2 (two) times daily. 08/15/18   [provider]  NITROSTAT 0.4 MG SL tablet Place 0.4 mg under the tongue every 5 (five) minutes as needed for chest pain.  07/28/12   [provider]  ondansetron (ZOFRAN) 4 MG tablet Take 1 tablet by mouth daily as needed. 08/15/18   [provider]  ROZEREM 8 MG tablet Take 8 mg by mouth at bedtime. 12/08/16   [provider]  traMADol (ULTRAM) 50 MG tablet Take 1 tablet by mouth as needed. 08/15/18   [provider]    Review of Systems:  Negative except as otherwise mentioned on HPI.   Physical Exam: Vitals:   09/20/18 1700 09/20/18 1730 09/20/18 1800 09/20/18 1826  BP: (!) 124/58 (!) 111/54 109/65 (!) 117/58  Pulse: 86 87 83 82  Resp:    17  Temp:    97.9 F (36.6 C)  TempSrc:    Oral  SpO2: 97% 96% 97% 99%  Weight:        Constitutional: NAD, calm, comfortable; and afebrile. Reporting pain in her left cheek and just feeling weak. Eyes: PERRL, lids and conjunctivae normal, no icterus. ENMT: Mucous membranes are mildly dry. Posterior pharynx clear of any exudate or lesions. Multiple lesion affecting her lips.  Neck: normal, supple, no masses, no thyromegaly Respiratory: clear to auscultation bilaterally, no wheezing, no crackles. Normal respiratory effort. No accessory muscle use.  Cardiovascular: Regular rate and rhythm, no murmurs / rubs / gallops. No extremity edema. 2+ pedal pulses. No carotid bruits.  Abdomen: no tenderness, no masses palpated. No hepatosplenomegaly. Bowel sounds positive.  Musculoskeletal: no clubbing / cyanosis. No joint deformity upper and lower extremities. Good ROM, no contractures. Normal muscle tone.  Skin: large eroded wound  in her left cheek with malodorous smell and purulent discharge; no petechiae, no rashes.   Neurologic: CN 2-12 grossly intact. Sensation intact, DTR normal. Strength 5/5 in all 4.  Psychiatric: Normal judgment and insight. Alert and oriented x 3. Flat affect appreciated.   Labs on Admission: I have personally reviewed the following labs and imaging studies  CBC: Recent Labs  Lab 09/20/18 1340  WBC 9.1  NEUTROABS 6.7  HGB 13.1  HCT 42.9  MCV 93.3  PLT 496   Basic Metabolic Panel: Recent Labs  Lab 09/20/18 1340  NA 142  K 4.1  CL 110  CO2 26  GLUCOSE 90  BUN 17  CREATININE 1.45*  CALCIUM 10.1   GFR: Estimated Creatinine Clearance: 27 mL/min (A) (by C-G formula based on SCr of 1.45 mg/dL (H)).   Liver Function Tests: Recent Labs  Lab 09/20/18 1340  AST 12*  ALT 9  ALKPHOS 87  BILITOT 0.6  PROT 7.0  ALBUMIN 3.0*   Urine analysis:    Component Value Date/Time   COLORURINE YELLOW 03/10/2015 1715   APPEARANCEUR HAZY (A) 03/10/2015 1715   LABSPEC >1.030 (H) 03/10/2015 1715   PHURINE 5.0 03/10/2015 1715   GLUCOSEU NEGATIVE 03/10/2015 1715   HGBUR NEGATIVE 03/10/2015 1715   BILIRUBINUR NEGATIVE 03/10/2015 1715   KETONESUR NEGATIVE 03/10/2015 1715   PROTEINUR NEGATIVE 03/10/2015 1715   UROBILINOGEN 0.2 03/10/2015 1715   NITRITE NEGATIVE 03/10/2015 1715   LEUKOCYTESUR NEGATIVE 03/10/2015 1715    Radiological Exams on Admission: No results found.  EKG: none   Assessment/Plan 1-left cheek wound/squamous cell cancer and superimposed infection -no signs of systemic infection currently -patient has failed chemotherapy and oncology service at [facility] has recommended comfort care -started on Vancomycin  -wound care consulted -Palliative care also consulted -continue IVF's and supportive care  2-CKD (chronic kidney disease) -at baseline currently -continue IVF's -follow renal function trend   3-Protein-calorie malnutrition, severe ([place]) -started on ensure  BID -full liquid diet for now -palliative care to further discussed [place] and invasive intervention, including PEG. -patient having difficulty eating and maintaining nutrition currently.  4-Dehydration -IVF's resuscitation to be provided -will follow response -advise to increase oral liquids intake.  5-Squamous cell cancer of buccal mucosa (HCC) -as mentioned above, no further treatment recommended by [facility] service. -Palliative care consulted  6-Hypotension -in the setting of poor hydration  -will provide IVF's  -follow VS   DVT prophylaxis: SCD"s Code Status: Full Family Communication: daughter by phone; Son in [place] updated at bedside. Disposition Plan: provide IVF's, start IV antibiotics, receive assessment and rec's by wound care and palliative care service. Consults called: Palliative care and Wound Care service. Admission status: LOS < 2 midnights, med-surg, observation status.   Time Spent: 65 minutes.  Barton Dubois MD [facility] Pager [phone]  If 7PM-7AM, please contact night-coverage [email or web address] Password [facility]  09/20/2018, 6:56 PM

## 2018-09-20 NOTE — ED Notes (Signed)
ED TO INPATIENT HANDOFF REPORT  Name/Age/Gender [name] [name] 77 y.o. female  Code Status Code Status History    Date Active Date Inactive Code Status Order ID Comments User Context   08/24/2018 0021 08/24/2018 2143 Full Code [id number]  Oswald Hillock, MD Inpatient   03/10/2015 1848 03/16/2015 2122 Full Code [id number]  Doree Albee, MD ED   12/22/2014 2035 12/30/2014 1804 DNR [id number]  Doree Albee, MD ED   12/22/2014 1839 12/22/2014 2035 DNR [id number]  Carmin Muskrat, MD ED   10/21/2014 0449 10/22/2014 1731 Full Code [id number]  Phillips Grout, MD Inpatient   03/10/2014 2024 03/12/2014 1734 Full Code [id number]  Caren Griffins, MD ED      Home/SNF/Other Home  Chief Complaint facial pain  Level of Care/Admitting Diagnosis ED Disposition    ED Disposition Condition [place]: [facility] [[id number]]  Level of Care: Med-Surg [16]  Diagnosis: Hypotension [321224]  Admitting Physician: Barton Dubois [[id number]]  Attending Physician: Barton Dubois [[id number]]  PT Class (Do Not Modify): Observation [104]  PT Acc Code (Do Not Modify): Observation [10022]       Medical History Past Medical History:  Diagnosis Date  . Anxiety   . Cancer ([place])    left facial  . Depression   . Diabetes mellitus without complication ([place])   . GERD (gastroesophageal reflux disease)   . Hyperlipidemia   . Hypertension   . Myocardial infarction ([place])   . Renal disorder    kidney failure   . Stroke [facility])    times 2    Allergies Allergies  Allergen Reactions  . Ciprofloxacin Anaphylaxis  . Aspirin Nausea And Vomiting  . Penicillins Nausea And Vomiting  . Propoxyphene N-Acetaminophen Nausea And Vomiting  . Sulfamethoxazole Rash    IV Location/Drains/Wounds Patient Lines/Drains/Airways Status   Active Line/Drains/Airways    Name:   Placement date:   Placement time:   Site:   Days:   Peripheral IV 09/20/18 Right Antecubital   09/20/18    1330    Antecubital    less than 1   Colostomy LLQ   -    -    LLQ             Labs/Imaging Results for orders placed or performed during the hospital encounter of 09/20/18 (from the past 48 hour(s))  I-Stat CG4 Lactic Acid, ED     Status: None   Collection Time: 09/20/18  1:37 PM  Result Value Ref Range   Lactic Acid, Venous 0.92 0.5 - 1.9 mmol/L  Comprehensive metabolic panel     Status: Abnormal   Collection Time: 09/20/18  1:40 PM  Result Value Ref Range   Sodium 142 135 - 145 mmol/L   Potassium 4.1 3.5 - 5.1 mmol/L   Chloride 110 98 - 111 mmol/L   CO2 26 22 - 32 mmol/L   Glucose, Bld 90 70 - 99 mg/dL   BUN 17 8 - 23 mg/dL   Creatinine, Ser 1.45 (H) 0.44 - 1.00 mg/dL   Calcium 10.1 8.9 - 10.3 mg/dL   Total Protein 7.0 6.5 - 8.1 g/dL   Albumin 3.0 (L) 3.5 - 5.0 g/dL   AST 12 (L) 15 - 41 U/L   ALT 9 0 - 44 U/L   Alkaline Phosphatase 87 38 - 126 U/L   Total Bilirubin 0.6 0.3 - 1.2 mg/dL   GFR calc non Af Amer 34 (L) >60 mL/min   GFR  calc Af Amer 39 (L) >60 mL/min    Comment: (NOTE) The eGFR has been calculated using the CKD EPI equation. This calculation has not been validated in all clinical situations. eGFR's persistently <60 mL/min signify possible Chronic Kidney Disease.    Anion gap 6 5 - 15    Comment: Performed at [facility], [address] [place], [place] [address]  CBC with Differential     Status: None   Collection Time: 09/20/18  1:40 PM  Result Value Ref Range   WBC 9.1 4.0 - 10.5 K/uL   RBC 4.60 3.87 - 5.11 MIL/uL   Hemoglobin 13.1 12.0 - 15.0 g/dL   HCT 42.9 36.0 - 46.0 %   MCV 93.3 80.0 - 100.0 fL   MCH 28.5 26.0 - 34.0 pg   MCHC 30.5 30.0 - 36.0 g/dL   RDW 14.0 11.5 - 15.5 %   Platelets 288 150 - 400 K/uL   nRBC 0.0 0.0 - 0.2 %   Neutrophils Relative % 73 %   Neutro Abs 6.7 1.7 - 7.7 K/uL   Lymphocytes Relative 14 %   Lymphs Abs 1.3 0.7 - 4.0 K/uL   Monocytes Relative 9 %   Monocytes Absolute 0.8 0.1 - 1.0 K/uL   Eosinophils Relative 3 %   Eosinophils  Absolute 0.2 0.0 - 0.5 K/uL   Basophils Relative 1 %   Basophils Absolute 0.1 0.0 - 0.1 K/uL   Immature Granulocytes 0 %   Abs Immature Granulocytes 0.03 0.00 - 0.07 K/uL    Comment: Performed at [facility], [address] [place], [place] [address]   No results found.  Pending Labs Unresulted Labs (From admission, onward)   None      Vitals/Pain Today's Vitals   09/20/18 1530 09/20/18 1600 09/20/18 1630 09/20/18 1700  BP: (!) 102/59 (!) 106/59 (!) 114/59 (!) 124/58  Pulse: 77 82 82 86  Resp:      Temp:      TempSrc:      SpO2: 97% 97% 98% 97%  Weight:      PainSc:        Isolation Precautions No active isolations  Medications Medications  sodium chloride 0.9 % bolus 500 mL (0 mLs Intravenous Stopped 09/20/18 1450)  vancomycin (VANCOCIN) IVPB 1000 mg/200 mL premix (0 mg Intravenous Stopped 09/20/18 1450)    Mobility walks with device

## 2018-09-20 NOTE — ED Provider Notes (Addendum)
[facility] EMERGENCY DEPARTMENT Provider Note   CSN: [id number] Arrival date & time: 09/20/18  1218     History   Chief Complaint Chief Complaint  Patient presents with  . Wound Check    HPI [name] [name] is a 77 y.o. female.  HPI  Presents with concern of worsening pain, discharge, drainage from a facial lesion. Patient has a history of squamous cell carcinoma, with recent cessation of chemotherapy due to reported lack of response. Patient saw her physician today due to worsening pain, generalized weakness, and was sent here for evaluation. Patient herself states that she feels poorly all over, denies focal discomfort beyond her face, does acknowledge generalized weakness, without dyspnea, without fever, without chills.   Past Medical History:  Diagnosis Date  . Anxiety   . Cancer ([place])    left facial  . Depression   . Diabetes mellitus without complication ([place])   . GERD (gastroesophageal reflux disease)   . Hyperlipidemia   . Hypertension   . Myocardial infarction ([place])   . Renal disorder    kidney failure   . Stroke [facility])    times 2    Patient Active Problem List   Diagnosis Date Noted  . Hypercalcemia 08/23/2018  . Squamous cell cancer of buccal mucosa ([place]) 01/07/2017  . Chronic kidney disease (CKD), stage IV (severe) ([place]) 01/07/2017  . Dehydration 03/10/2015  . Protein-calorie malnutrition, severe ([place]) 12/24/2014  . Metabolic acidosis 79/12/4095  . Hypovolemic shock ([place]) 12/24/2014  . Encephalopathy acute 10/21/2014  . CKD (chronic kidney disease) 10/21/2014  . Fall at home 10/21/2014  . H/O: CVA (cerebrovascular accident) 10/21/2014  . Altered bowel elimination due to intestinal ostomy ([place]) 10/21/2014  . 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 Acute on chronic renal failure ([place]) 03/11/2014  . Contusion of left hip 03/10/2014  . Hip pain 03/10/2014  . DOE (dyspnea on exertion)  08/18/2012  . Chest pain 08/04/2011  . Tobacco dipper 04/29/2011  . Anxiety   . Depression   . Hypertension   . GERD (gastroesophageal reflux disease)   . Hyperlipidemia   . Myocardial infarction ([place])   . Stroke ([place])   . CAD, NATIVE VESSEL 02/20/2010  . SHOULDER PAIN 10/09/2007  . RUPTURE ROTATOR CUFF 10/09/2007    Past Surgical History:  Procedure Laterality Date  . ABDOMINAL HYSTERECTOMY    . CATARACT EXTRACTION W/PHACO Right 03/20/2013   Procedure: CATARACT EXTRACTION PHACO AND INTRAOCULAR LENS PLACEMENT (IOC);  Surgeon: Elta Guadeloupe T. Gershon Crane, MD;  Location: [facility];  Service: Ophthalmology;  Laterality: Right;  CDE:  6.29   . CHOLECYSTECTOMY    . COLOSTOMY    . OOPHORECTOMY    . TONSILLECTOMY       OB History   None      Home Medications    Prior to Admission medications   Medication Sig Start Date End Date Taking? Authorizing Provider  acetaminophen (TYLENOL) 500 MG tablet Take 1 tablet (500 mg total) by mouth every 6 (six) hours as needed for mild pain. 12/30/14   Samuella Cota, MD  albuterol (PROVENTIL) 4 MG tablet Take by mouth. 07/03/12   [provider]  ALPRAZolam Duanne Moron) 0.5 MG tablet Take 1 tablet (0.5 mg total) by mouth 4 (four) times daily as needed for anxiety or sleep. 12/30/14   Samuella Cota, MD  amitriptyline (ELAVIL) 25 MG tablet Take by mouth. 12/21/11   [provider]  atorvastatin (LIPITOR) 20 MG tablet Take by mouth. 12/21/11   [provider]  cholecalciferol (VITAMIN D) 1000 units tablet Take 1,000 Units by mouth daily. 2000 units daily    [provider]  clopidogrel (PLAVIX) 75 MG tablet Take 1 tablet (75 mg total) by mouth daily. 09/26/12   Wall, Marijo Conception, MD  cyclobenzaprine (FLEXERIL) 5 MG tablet Take 1 tablet by mouth daily. 08/07/18   [provider]  escitalopram (LEXAPRO) 20 MG tablet Take 20 mg by mouth daily.    [provider]  esomeprazole (NEXIUM) 40 MG capsule  12/21/11    [provider]  feeding supplement, RESOURCE BREEZE, (RESOURCE BREEZE) LIQD Take 1 Container by mouth 3 (three) times daily between meals. 12/30/14   Samuella Cota, MD  HYDROcodone-acetaminophen [facility]) 10-325 MG per tablet Take 1 tablet by mouth every 6 (six) hours as needed for moderate pain. Patient taking differently: Take 0.5-1 tablets by mouth every 6 (six) hours as needed for moderate pain.  12/30/14   Samuella Cota, MD  magnesium oxide (MAG-OX) 400 (241.3 Mg) MG tablet Take 2 tablets by mouth 2 (two) times daily. 08/15/18   [provider]  NITROSTAT 0.4 MG SL tablet Place 0.4 mg under the tongue every 5 (five) minutes as needed for chest pain.  07/28/12   [provider]  ondansetron (ZOFRAN) 4 MG tablet Take 1 tablet by mouth daily as needed. 08/15/18   [provider]  ROZEREM 8 MG tablet Take 8 mg by mouth at bedtime. 12/08/16   [provider]  traMADol (ULTRAM) 50 MG tablet Take 1 tablet by mouth as needed. 08/15/18   [provider]    Family History Family History  Problem Relation Age of Onset  . Cancer Other   . Diabetes Other   . Abnormal EKG Other   . Arthritis Other   . Asthma Other   . Kidney disease Other     Social History Social History   Tobacco Use  . Smoking status: Former Smoker    Packs/day: 0.25    Years: 15.00    Pack years: 3.75    Types: Cigarettes    Start date: 12/12/1956    Last attempt to quit: 08/18/1972    Years since quitting: 46.1  . Smokeless tobacco: Current User    Types: Snuff  . Tobacco comment: 1-2 cigs daily x 6 months  Substance Use Topics  . Alcohol use: No    Alcohol/week: 0.0 standard drinks  . Drug use: No     Allergies   Ciprofloxacin; Aspirin; Penicillins; Propoxyphene n-acetaminophen; and Sulfamethoxazole   Review of Systems Review of Systems  Constitutional:       Per HPI, otherwise negative  HENT:       Per HPI, otherwise negative  Respiratory:        Per HPI, otherwise negative  Cardiovascular:       Per HPI, otherwise negative  Gastrointestinal: Negative for vomiting.  Endocrine:       Negative aside from HPI  Genitourinary:       Neg aside from HPI   Musculoskeletal:       Per HPI, otherwise negative  Skin: Positive for color change and wound.  Allergic/Immunologic: Positive for immunocompromised state.  Neurological: Positive for weakness. Negative for syncope.     Physical Exam Updated Vital Signs BP 107/69 (BP Location: Left Arm)   Pulse 93   Temp 98.4 F (36.9 C)   Resp 18  Wt 63.5 kg   SpO2 96%   BMI 27.34 kg/m   Physical Exam  Constitutional: She is oriented to person, place, and time. She appears cachectic. She has a sickly appearance.  HENT:  Head: Normocephalic and atraumatic.    Eyes: Conjunctivae and EOM are normal.  Cardiovascular: Regular rhythm. Tachycardia present.  Pulmonary/Chest: Effort normal and breath sounds normal. No stridor. No respiratory distress.  Abdominal: She exhibits no distension.  Musculoskeletal: She exhibits no edema.  Neurological: She is alert and oriented to person, place, and time. No cranial nerve deficit.  Skin: Skin is warm and dry.  Psychiatric: She has a normal mood and affect.  Nursing note and vitals reviewed.    ED Treatments / Results  Labs (all labs ordered are listed, but only abnormal results are displayed) Labs Reviewed  COMPREHENSIVE METABOLIC PANEL - Abnormal; Notable for the following components:      Result Value   Creatinine, Ser 1.45 (*)    Albumin 3.0 (*)    AST 12 (*)    GFR calc non Af Amer 34 (*)    GFR calc Af Amer 39 (*)    All other components within normal limits  CBC WITH DIFFERENTIAL/PLATELET  I-STAT CG4 LACTIC ACID, ED  I-STAT CG4 LACTIC ACID, ED    Procedures Procedures (including critical care time)  Medications Ordered in ED Medications  sodium chloride 0.9 % bolus 500 mL (500 mLs Intravenous New Bag/Given 09/20/18 1348)   vancomycin (VANCOCIN) IVPB 1000 mg/200 mL premix (1,000 mg Intravenous New Bag/Given 09/20/18 1349)     Initial Impression / Assessment and Plan / ED Course  I have reviewed the triage vital signs and the nursing notes.  Pertinent labs & imaging results that were available during my care of the patient were reviewed by me and considered in my medical decision making (see chart for details).  After the initial evaluation I reviewed the patient's chart including notes from her oncology visit last week, pertinent finding below; # Invasive oral SqCC: Treatment history summarized above. She was started on palliative systemic treatment with pembrolizumab every 3 weeks on 04/27/2018. She is s/p 5 administrations of this medication. Unfortunately, she appears to be tolerating this medication poorly with increased SCr & electrolyte disturbance. She also appears to be gaining very little clinical benefit from this medication, which is given with palliative intent. We had an extensive goals of care discussion today with the patient, her son-in-law & her daughter [name] [name] - via telephone). After discussion, the patient & her family were in agreement to proceed with hospice services.   This elderly female presents with purulent discharge from a left facial lesion, previously diagnosed with cancer, now with noted nonresponse to ongoing palliative chemotherapy. Patient has a patent airway, does not require intubation, but does have evidence for facial infection, tachycardia, hypotension and given her immunocompromise state, received IV antibiotics, was admitted for further evaluation and management.  Letter to admission I discussed patient's case with care management team, and hospice team. I discussed hospice enrollment with the patient herself, who states that she is open to consultation tomorrow morning, which has been arranged. Final Clinical Impressions(s) / ED Diagnoses  Open wound of face with  complication, initial encounter   Carmin Muskrat, MD 09/20/18 1411    Carmin Muskrat, MD 09/20/18 1550

## 2018-09-20 NOTE — Progress Notes (Signed)
Pharmacy Antibiotic Note  [name] [name] is a 77 y.o. female admitted on 09/20/2018 with wound infection.  Pharmacy has been consulted for Vancomycin dosing.  Plan: Vancomycin 750 mg IV every 24 hours.  Goal trough 15-20 mcg/mL. Monitor labs, c/s, and vanco trough as indicated.  Weight: 140 lb (63.5 kg)  Temp (24hrs), Avg:98 F (36.7 C), Min:97.7 F (36.5 C), Max:98.4 F (36.9 C)  Recent Labs  Lab 09/20/18 1337 09/20/18 1340  WBC  --  9.1  CREATININE  --  1.45*  LATICACIDVEN 0.92  --     Estimated Creatinine Clearance: 27 mL/min (A) (by C-G formula based on SCr of 1.45 mg/dL (H)).    Allergies  Allergen Reactions  . Ciprofloxacin Anaphylaxis  . Aspirin Nausea And Vomiting  . Penicillins Nausea And Vomiting  . Propoxyphene N-Acetaminophen Nausea And Vomiting  . Sulfamethoxazole Rash    Antimicrobials this admission: Vanco 11/13 >>      Dose adjustments this admission: [place]  Microbiology results: None pending  Thank you for allowing pharmacy to be a part of this patient's care.  Ramond Craver 09/20/2018 7:02 PM

## 2018-09-20 NOTE — Care Management Note (Signed)
Case Management Note  Patient Details  Name: [name] [name] MRN: [id number] Date of Birth: [birth year]  Subjective/Objective:    "Invasive oral SqCC: She was started on palliative systemic treatment with pembrolizumab every 3 weeks on 04/27/2018.  " Sent over by PCP. Patient will answer yes, no to questions, then falls asleep. No family at bedside. Call to [facility] [phone] -lady answers and states "this is not Abigail Butts and hangs up". Patient's home number-only rings.   Patient reports her son in law brought her to ER and doesn't know any other numbers.   Palliative consulted.  CM consulted to make hospice referral, potentially.   Discussed with bedside RN, son in law dropped patient off around 4 and said he would be right back and has not returned.   ADDENDUM: Palliative NP did receive a call from patient's  daughter. She plans to follow up with patient/family tomorrow.              Action/Plan: CM to follow.   Expected Discharge Date:    09/21/2018              Expected Discharge Plan:  Home w Hospice Care  In-House Referral:  Hospice / Palliative Care  Discharge planning Services  CM Consult  Post Acute Care Choice:    Choice offered to:     DME Arranged:    DME Agency:     HH Arranged:    HH Agency:     Status of Service:  In process, will continue to follow  If discussed at Long Length of Stay Meetings, dates discussed:    Additional Comments:  Lilyahna Sirmon, Chauncey Reading, RN 09/20/2018, 3:38 PM

## 2018-09-20 NOTE — ED Triage Notes (Addendum)
Pt sent by Dr Gerarda Fraction due to tumor on left check area. Pt has large open area that is foul smelling. Pt no longer receives treatment for cancer. Pt sent here for admission. Pt reports difficulty eating.Pt treated at [facility], but was told nothing more could be done

## 2018-09-20 NOTE — Consult Note (Signed)
[place] Nurse wound consult note Reason for Consult:Squamous cell carcinoma to left cheek.  Nonintact lesions with foul odor.  Wound type:cancerous Pressure Injury POA: NA Wound JLU:NGBMBOMQT lesion to left cheek.  Patient is comfort care only and has started vancomycin for purulence.  Will add topical hydrogen peroxide twice daily which may help with odor.  Drainage (amount, consistency, odor) moderate purulence Periwound:intact facial skin Dressing procedure/placement/frequency: Cleanse wound to left cheek with NS.  Apply hydrogen peroxide to lesion. With Q tip.  If possible, cover with silicone border foam.  Remove foam to re-apply peroxide twice daily.  Change foam every three days and PRN soilage.  Will not follow at this time.  Please re-consult if needed.  Domenic Moras MSN, RN, FNP-BC CWON Wound, Ostomy, Continence Nurse Pager [phone]

## 2018-09-21 DIAGNOSIS — R1310 Dysphagia, unspecified: Secondary | ICD-10-CM | POA: Diagnosis present

## 2018-09-21 DIAGNOSIS — Z66 Do not resuscitate: Secondary | ICD-10-CM

## 2018-09-21 DIAGNOSIS — E860 Dehydration: Secondary | ICD-10-CM | POA: Diagnosis not present

## 2018-09-21 DIAGNOSIS — I9589 Other hypotension: Secondary | ICD-10-CM | POA: Diagnosis not present

## 2018-09-21 DIAGNOSIS — Z9049 Acquired absence of other specified parts of digestive tract: Secondary | ICD-10-CM | POA: Diagnosis not present

## 2018-09-21 DIAGNOSIS — N184 Chronic kidney disease, stage 4 (severe): Secondary | ICD-10-CM | POA: Diagnosis present

## 2018-09-21 DIAGNOSIS — Z515 Encounter for palliative care: Secondary | ICD-10-CM | POA: Diagnosis not present

## 2018-09-21 DIAGNOSIS — R339 Retention of urine, unspecified: Secondary | ICD-10-CM | POA: Diagnosis present

## 2018-09-21 DIAGNOSIS — L0889 Other specified local infections of the skin and subcutaneous tissue: Secondary | ICD-10-CM | POA: Diagnosis present

## 2018-09-21 DIAGNOSIS — F419 Anxiety disorder, unspecified: Secondary | ICD-10-CM | POA: Diagnosis present

## 2018-09-21 DIAGNOSIS — K219 Gastro-esophageal reflux disease without esophagitis: Secondary | ICD-10-CM | POA: Diagnosis present

## 2018-09-21 DIAGNOSIS — C060 Malignant neoplasm of cheek mucosa: Secondary | ICD-10-CM | POA: Diagnosis not present

## 2018-09-21 DIAGNOSIS — Z6827 Body mass index (BMI) 27.0-27.9, adult: Secondary | ICD-10-CM | POA: Diagnosis not present

## 2018-09-21 DIAGNOSIS — E1122 Type 2 diabetes mellitus with diabetic chronic kidney disease: Secondary | ICD-10-CM | POA: Diagnosis present

## 2018-09-21 DIAGNOSIS — Z8673 Personal history of transient ischemic attack (TIA), and cerebral infarction without residual deficits: Secondary | ICD-10-CM | POA: Diagnosis not present

## 2018-09-21 DIAGNOSIS — I129 Hypertensive chronic kidney disease with stage 1 through stage 4 chronic kidney disease, or unspecified chronic kidney disease: Secondary | ICD-10-CM | POA: Diagnosis present

## 2018-09-21 DIAGNOSIS — Z7189 Other specified counseling: Secondary | ICD-10-CM

## 2018-09-21 DIAGNOSIS — Z886 Allergy status to analgesic agent status: Secondary | ICD-10-CM | POA: Diagnosis not present

## 2018-09-21 DIAGNOSIS — F329 Major depressive disorder, single episode, unspecified: Secondary | ICD-10-CM | POA: Diagnosis present

## 2018-09-21 DIAGNOSIS — Z87891 Personal history of nicotine dependence: Secondary | ICD-10-CM | POA: Diagnosis not present

## 2018-09-21 DIAGNOSIS — G893 Neoplasm related pain (acute) (chronic): Secondary | ICD-10-CM | POA: Diagnosis present

## 2018-09-21 DIAGNOSIS — I959 Hypotension, unspecified: Secondary | ICD-10-CM | POA: Diagnosis present

## 2018-09-21 DIAGNOSIS — E785 Hyperlipidemia, unspecified: Secondary | ICD-10-CM | POA: Diagnosis present

## 2018-09-21 DIAGNOSIS — E43 Unspecified severe protein-calorie malnutrition: Secondary | ICD-10-CM | POA: Diagnosis not present

## 2018-09-21 DIAGNOSIS — I252 Old myocardial infarction: Secondary | ICD-10-CM | POA: Diagnosis not present

## 2018-09-21 DIAGNOSIS — N183 Chronic kidney disease, stage 3 (moderate): Secondary | ICD-10-CM | POA: Diagnosis not present

## 2018-09-21 DIAGNOSIS — Z90710 Acquired absence of both cervix and uterus: Secondary | ICD-10-CM | POA: Diagnosis not present

## 2018-09-21 LAB — GLUCOSE, CAPILLARY
GLUCOSE-CAPILLARY: 69 mg/dL — AB (ref 70–99)
GLUCOSE-CAPILLARY: 116 mg/dL — AB (ref 70–99)
Glucose-Capillary: 86 mg/dL (ref 70–99)
Glucose-Capillary: 141 mg/dL — ABNORMAL HIGH (ref 70–99)
Glucose-Capillary: 97 mg/dL (ref 70–99)

## 2018-09-21 LAB — CBC
HCT: 41.9 % (ref 36.0–46.0)
HEMOGLOBIN: 12.4 g/dL (ref 12.0–15.0)
MCH: 27.8 pg (ref 26.0–34.0)
MCHC: 29.6 g/dL — AB (ref 30.0–36.0)
MCV: 93.9 fL (ref 80.0–100.0)
PLATELETS: 271 10*3/uL (ref 150–400)
RBC: 4.46 MIL/uL (ref 3.87–5.11)
RDW: 13.9 % (ref 11.5–15.5)
WBC: 10.9 10*3/uL — ABNORMAL HIGH (ref 4.0–10.5)
nRBC: 0 % (ref 0.0–0.2)

## 2018-09-21 LAB — BASIC METABOLIC PANEL
ANION GAP: 8 (ref 5–15)
BUN: 15 mg/dL (ref 8–23)
CALCIUM: 9.7 mg/dL (ref 8.9–10.3)
CO2: 23 mmol/L (ref 22–32)
Chloride: 112 mmol/L — ABNORMAL HIGH (ref 98–111)
Creatinine, Ser: 1.17 mg/dL — ABNORMAL HIGH (ref 0.44–1.00)
GFR calc Af Amer: 51 mL/min — ABNORMAL LOW (ref >60)
GFR, EST NON AFRICAN AMERICAN: 44 mL/min — AB (ref >60)
Glucose, Bld: 82 mg/dL (ref 70–99)
POTASSIUM: 4.1 mmol/L (ref 3.5–5.1)
SODIUM: 143 mmol/L (ref 135–145)

## 2018-09-21 MED ORDER — ENSURE ENLIVE PO LIQD
237.0000 mL | Freq: Three times a day (TID) | ORAL | Status: DC
  Administered 2018-09-21 (×2): 237 mL via ORAL

## 2018-09-21 MED ORDER — GLUCERNA SHAKE PO LIQD
237.0000 mL | Freq: Three times a day (TID) | ORAL | Status: DC
  Administered 2018-09-21 – 2018-09-22 (×4): 237 mL via ORAL

## 2018-09-21 NOTE — Progress Notes (Signed)
Orthostatic vital signs obtained. Pt two person assist to standing position. Pt reporting dizziness upon standing.

## 2018-09-21 NOTE — Consult Note (Signed)
Consultation Note Date: 09/21/2018   Patient Name: [name] [name]  DOB: 03/27/1941  MRN: [id number]  Age / Sex: 77 y.o., female  PCP: Redmond School, MD Referring Physician: Barton Dubois, MD  Reason for Consultation: Establishing goals of care  HPI/Patient Profile: 77 y.o. 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   Clinical Assessment and Goals of Care: I met today with [name] who is pleasant but very flat affect. Difficult to get more of an answer from her other than yes or no. She does tell me when asked what she knows about her cancer "that it won't get better." She tells me she would NOT want a feeding tube (knowing this would not fix her cancer). She does tell me that she is not worried as it doesn't do any good and "I leave it up to God." She shares that when God is ready for her she is ready to go. She says she does not fear death. When asked what I can do for her she tells me "pray." I ask her what she wants me to pray for and she says "for my family." I get the impression that [name] is at peace with poor prognosis but wanting to please her family.   I spoke with [name]'s daughter, Abigail Butts [phone], via telephone. Abigail Butts still sounds horrible and says she continues running a fever so will not be at the hospital as she does not want to pass on anything more to her mother. Abigail Butts is struggling as she feels like her mother wants to fight and they want her to "get better" but they feel like everyone is giving up on her. I explained that this is a difficult situation and explained that chemo treatment was stopped because her  mother's cancer was progressing, she was worsening, and the chemo was not worth continuing with severe side effects and risk if not getting the benefits. Abigail Butts is still unhappy with the care her mother received. However, Abigail Butts agrees that her mother would not benefit from CPR/life support and says that her mother has always said "when God calls her she is ready." I explained that this is DNR and I will put this in place. Reassured Abigail Butts that this only means that when her mother physically dies we will not put her through above mentioned measures that would cause her more pain and suffering. We also discussed how to best care for her mother. I explained that I believe that they would benefit from having hospice come to the house to help support all of them given the state of [name]'s cancer and that I fear this will only get worse and more complicated. Abigail Butts also agrees to hospice but says "Momma never wants to go TO hospice, she wants to die  in her home."   I reconfirmed with [name] my conversation with Abigail Butts and plan for DNR and hospice to follow at home and if she agrees with this plan. [name] says "whatever my daughter wants." [name]'s wishes as she explained above seem more aligned with DNR and hospice. She has no issues with me putting these into place. She was worried that Abigail Butts did not want her to have hospice but "it's okay with me." I reassured her that I spoke with Abigail Butts and she agreed that hospice could help them.   Emotional support provided to patient and family.   Primary Decision Maker PATIENT    SUMMARY OF RECOMMENDATIONS   - DNR - No feeding tubes - Home with hospice (continue treating the treatable) - Patient and family would benefit from continued support and discussions as they have poor insight into trajectory and expectations  Code Status/Advance Care Planning:  DNR - agreed by patient and daughter   Symptom Management:   Dysphagia/poor intake:  Tolerates liquids. Enjoys chocolate Glucerna and ice cream. Add Magic Cup for increased protein and nutrition. No feeding tube.   Palliative Prophylaxis:   Aspiration, Frequent Pain Assessment and Oral Care, Palliative wound care  Additional Recommendations (Limitations, Scope, Preferences):  No Artificial Feeding  Psycho-social/Spiritual:   Desire for further Chaplaincy support:yes  Additional Recommendations: Caregiving  Support/Resources, Education on Hospice and Grief/Bereavement Support  Prognosis:   < 3 months  Discharge Planning: Home with Hospice      Primary Diagnoses: Present on Admission: . Hypotension . Squamous cell cancer of buccal mucosa ([place]) . Protein-calorie malnutrition, severe ([place]) . Dehydration . CKD (chronic kidney disease) . Wound infection   I have reviewed the medical record, interviewed the patient and family, and examined the patient. The following aspects are pertinent.  Past Medical History:  Diagnosis Date  . Anxiety   . Cancer ([place])    left facial  . Depression   . Diabetes mellitus without complication ([name])   . GERD (gastroesophageal reflux disease)   . Hyperlipidemia   . Hypertension   . Myocardial infarction ([place])   . Renal disorder    kidney failure   . Stroke [facility])    times 2   Social History   Socioeconomic History  . Marital status: Widowed    Spouse name: Not on file  . Number of children: Not on file  . Years of education: Not on file  . Highest education level: Not on file  Occupational History  . Occupation: disabled  Social Needs  . Financial resource strain: Not on file  . Food insecurity:    Worry: Not on file    Inability: Not on file  . Transportation needs:    Medical: Not on file    Non-medical: Not on file  Tobacco Use  . Smoking status: Former Smoker    Packs/day: 0.25    Years: 15.00    Pack years: 3.75    Types: Cigarettes    Start date: 12/12/1956    Last attempt to quit: 08/18/1972     Years since quitting: 46.1  . Smokeless tobacco: Current User    Types: Snuff  . Tobacco comment: 1-2 cigs daily x 6 months  Substance and Sexual Activity  . Alcohol use: No    Alcohol/week: 0.0 standard drinks  . Drug use: No  . Sexual activity: Yes    Birth control/protection: Surgical  Lifestyle  . Physical activity:    Days per week: Not  on file    Minutes per session: Not on file  . Stress: Not on file  Relationships  . Social connections:    Talks on phone: Not on file    Gets together: Not on file    Attends religious service: Not on file    Active member of club or organization: Not on file    Attends meetings of clubs or organizations: Not on file    Relationship status: Not on file  Other Topics Concern  . Not on file  Social History Narrative  . Not on file   Family History  Problem Relation Age of Onset  . Cancer Other   . Diabetes Other   . Abnormal EKG Other   . Arthritis Other   . Asthma Other   . Kidney disease Other    Scheduled Meds: . amitriptyline  25 mg Oral QHS  . escitalopram  20 mg Oral Daily  . feeding supplement (ENSURE ENLIVE)  237 mL Oral TID BM  . hydrogen peroxide   Topical BID  . insulin aspart  0-9 Units Subcutaneous TID WC  . pantoprazole  40 mg Oral Daily   Continuous Infusions: . sodium chloride 100 mL/hr at 09/21/18 0550  . vancomycin     PRN Meds:.acetaminophen, morphine CONCENTRATE, ondansetron **OR** ondansetron (ZOFRAN) IV, traMADol Allergies  Allergen Reactions  . Ciprofloxacin Anaphylaxis  . Aspirin Nausea And Vomiting  . Penicillins Nausea And Vomiting  . Propoxyphene N-Acetaminophen Nausea And Vomiting  . Sulfamethoxazole Rash   Review of Systems  Constitutional: Positive for activity change, appetite change and fatigue.  Gastrointestinal: Negative for constipation and diarrhea.  Skin: Positive for wound.  Neurological: Positive for weakness.    Physical Exam  Constitutional: She is oriented to person,  place, and time. She appears ill.  Cardiovascular: Normal rate.  Pulmonary/Chest: Effort normal. No accessory muscle usage. No tachypnea. No respiratory distress.  Abdominal: Normal appearance.  Neurological: She is alert and oriented to person, place, and time.  Skin:  Purulent drainage to center lip wound and left facial wound covered with dressing. Malodorous.   Psychiatric:  Flat   Nursing note and vitals reviewed.   Vital Signs: BP 105/63 (BP Location: Right Arm)   Pulse (!) 117   Temp 98.2 F (36.8 C) (Oral)   Resp 18   Ht 5' (1.524 m) Comment: pt stated  Wt 63 kg   SpO2 100%   BMI 27.13 kg/m  Pain Scale: 0-10   Pain Score: 5    SpO2: SpO2: 100 % O2 Device:SpO2: 100 % O2 Flow Rate: .   IO: Intake/output summary:   Intake/Output Summary (Last 24 hours) at 09/21/2018 0942 Last data filed at 09/21/2018 0300 Gross per 24 hour  Intake 1386.07 ml  Output -  Net 1386.07 ml    LBM: Last BM Date: 09/20/18 Baseline Weight: Weight: 63.5 kg Most recent weight: Weight: 63 kg     Palliative Assessment/Data: 30%     Time In/Out: 0930-1000, 1145-1230 Time Total: 75 min Greater than 50%  of this time was spent counseling and coordinating care related to the above assessment and plan.  Signed by: Vinie Sill, NP Palliative Medicine Team Pager # [phone] (M-F 8a-5p) Team Phone # [phone] (Nights/Weekends)

## 2018-09-21 NOTE — Progress Notes (Signed)
PROGRESS NOTE    [name] [name]  OIN:[id number] DOB: [birth year] DOA: 09/20/2018 PCP: Redmond School, MD     Brief Narrative:  77 y.o. 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 Patient apparently release from oncology service after failing treatment with chemotherapy; advise to pursuit comfort care only. Patient is not having any fever or chills, but is experiencing worsening pain in her left cheek wound and is actively having purulent discharge and malodorous smell. She has not being able to eat or drink properly in the last couple days due to ongoing pain. During visit at her PCP BP was soft and patient felt to be dehydrated.   In ED, SBP in low 90's initially, no fever and normal WBC's. Renal function and rest of electrolytes at baseline. IVF initiated, IV antibiotics started and palliative care consulted. TRH contacted to place in observation for further evaluation and management.   Assessment & Plan: 1-left cheek/squamous cell cancer and superimposed infection -Appreciate wound care services recommendation -Continue dressing changes and peroxide application twice a day -Patient has failed chemotherapy treatment as an outpatient and at this moment the plan is to pursued hospice care. -Continue IV antibiotics x1 more days -Continue IV fluids and supportive care.  2-dehydration/hypotension -reported lightheadedness this morning while having orthostatic checks -Continue fluid resuscitation.  3-urinary retention -In and out cath has been provided -Most likely associated with the use of narcotics for pain  4-chronic renal failure -Patient with a stage III -Creatinine at baseline to a slightly improved with fluid  given -Follow renal function trend.  5-severe protein calorie malnutrition -Decision has been made for no PEG tube or artificial nutrition -Will continue full liquid diet; advance as tolerated for comfort. -Started on Glucerna and Magic cup puddings    DVT prophylaxis: SCDs Code Status: DNR Family Communication: No family at bedside. Disposition Plan: After discussion with palliative care services family have decided to pursued hospice care at home.  Will continue wound care recommendations and 1 more day of IV antibiotics.  Consultants:   Palliative care  Wound Care service  Procedures:   None  Antimicrobials:  Anti-infectives (From admission, onward)   Start     Dose/Rate Route Frequency Ordered Stop   09/21/18 1000  vancomycin (VANCOCIN) IVPB 750 mg/150 ml premix     750 mg 150 mL/hr over 60 Minutes Intravenous Every 24 hours 09/20/18 1902     09/20/18 1330  vancomycin (VANCOCIN) IVPB 1000 mg/200 mL premix     1,000 mg 200 mL/hr over 60 Minutes Intravenous  Once 09/20/18 1321 09/20/18 1450       Subjective: Per nursing staff patient has not had urine output since yesterday and bladder scan demonstrated over 400 mL; patient denies chest pain, shortness of breath, abdominal pain, nausea vomiting.  Reports poor appetite and is complaining of pain on her left cheek wound.  Objective: Vitals:   09/21/18 0603 09/21/18 0607 09/21/18 0608 09/21/18 1413  BP: 132/69 127/64 105/63 117/71  Pulse: 92 99 (!) 117 100  Resp:    18  Temp: 98.2 F (36.8 C)   98.1 F (36.7 C)  TempSrc: Oral     SpO2: 99% 100% 100% 98%  Weight:      Height:  Intake/Output Summary (Last 24 hours) at 09/21/2018 1718 Last data filed at 09/21/2018 1414 Gross per 24 hour  Intake 686.07 ml  Output 1250 ml  Net -563.93 ml   Filed Weights   09/20/18 1239 09/20/18 1956  Weight: 63.5 kg 63 kg    Examination: General exam: Alert, awake, oriented x 3; reports poor oral intake and  decreased appetite.  Afebrile.  Having pain on her left cheek wound. Respiratory system: Clear to auscultation. Respiratory effort normal. Cardiovascular system:RRR. No murmurs, rubs, gallops. Gastrointestinal system: Abdomen is nondistended, soft and nontender. No organomegaly or masses felt. Normal bowel sounds heard. Central nervous system: Alert and oriented. No focal neurological deficits. Extremities: No cyanosis or clubbing. Skin: No petechiae.  Patient with open eroded and purulent wound on her left cheek; patient also with some chronic malignant lesions appreciated on her upper and lower lips.  No surrounding erythema. Psychiatry: Judgement and insight appear normal.  Flat affect, no hallucinations.   Data Reviewed: I have personally reviewed following labs and imaging studies  CBC: Recent Labs  Lab 09/20/18 1340 09/21/18 0546  WBC 9.1 10.9*  NEUTROABS 6.7  --   HGB 13.1 12.4  HCT 42.9 41.9  MCV 93.3 93.9  PLT 288 106   Basic Metabolic Panel: Recent Labs  Lab 09/20/18 1331 09/20/18 1340 09/21/18 0546  NA  --  142 143  K  --  4.1 4.1  CL  --  110 112*  CO2  --  26 23  GLUCOSE  --  90 82  BUN  --  17 15  CREATININE  --  1.45* 1.17*  CALCIUM  --  10.1 9.7  MG 2.5*  --   --   PHOS 2.9  --   --    GFR: Estimated Creatinine Clearance: 33.4 mL/min (A) (by C-G formula based on SCr of 1.17 mg/dL (H)).   Liver Function Tests: Recent Labs  Lab 09/20/18 1340  AST 12*  ALT 9  ALKPHOS 87  BILITOT 0.6  PROT 7.0  ALBUMIN 3.0*   CBG: Recent Labs  Lab 09/20/18 2229 09/21/18 0749 09/21/18 0808 09/21/18 1126 09/21/18 1616  GLUCAP 75 69* 86 141* 97   Urine analysis:    Component Value Date/Time   COLORURINE YELLOW 03/10/2015 1715   APPEARANCEUR HAZY (A) 03/10/2015 1715   LABSPEC >1.030 (H) 03/10/2015 1715   PHURINE 5.0 03/10/2015 1715   GLUCOSEU NEGATIVE 03/10/2015 1715   HGBUR NEGATIVE 03/10/2015 1715   BILIRUBINUR NEGATIVE 03/10/2015 1715   KETONESUR  NEGATIVE 03/10/2015 1715   PROTEINUR NEGATIVE 03/10/2015 1715   UROBILINOGEN 0.2 03/10/2015 1715   NITRITE NEGATIVE 03/10/2015 1715   LEUKOCYTESUR NEGATIVE 03/10/2015 1715   Scheduled Meds: . amitriptyline  25 mg Oral QHS  . escitalopram  20 mg Oral Daily  . feeding supplement (GLUCERNA SHAKE)  237 mL Oral TID BM  . hydrogen peroxide   Topical BID  . insulin aspart  0-9 Units Subcutaneous TID WC  . pantoprazole  40 mg Oral Daily   Continuous Infusions: . sodium chloride 100 mL/hr at 09/21/18 0550  . vancomycin 750 mg (09/21/18 0946)     LOS: 0 days    Time spent: 25 minutes    Barton Dubois, MD [facility] Pager [phone]  If 7PM-7AM, please contact night-coverage [email or web address] Password [email or web address] 09/21/2018, 5:18 PM

## 2018-09-21 NOTE — Care Management Obs Status (Signed)
[place] NOTIFICATION   Patient Details  Name: [name] [name] MRN: [id number] Date of Birth: Sep 04, 1941   Medicare Observation Status Notification Given:  Yes    Stella Bortle, Chauncey Reading, RN 09/21/2018, 8:07 AM

## 2018-09-22 DIAGNOSIS — Z66 Do not resuscitate: Secondary | ICD-10-CM

## 2018-09-22 LAB — GLUCOSE, CAPILLARY
GLUCOSE-CAPILLARY: 129 mg/dL — AB (ref 70–99)
Glucose-Capillary: 83 mg/dL (ref 70–99)

## 2018-09-22 LAB — CREATININE, SERUM
Creatinine, Ser: 1.13 mg/dL — ABNORMAL HIGH (ref 0.44–1.00)
GFR calc Af Amer: 53 mL/min — ABNORMAL LOW (ref >60)
GFR calc non Af Amer: 46 mL/min — ABNORMAL LOW (ref >60)

## 2018-09-22 MED ORDER — MORPHINE SULFATE (CONCENTRATE) 10 MG/0.5ML PO SOLN: 10.0000 mg | ORAL | 0 refills | Status: AC | PRN

## 2018-09-22 MED ORDER — HYDROGEN PEROXIDE 3 % EX SOLN: Freq: Two times a day (BID) | CUTANEOUS | 0 refills | Status: AC

## 2018-09-22 MED ORDER — DOXYCYCLINE HYCLATE 100 MG PO TABS: 100.0000 mg | ORAL_TABLET | Freq: Two times a day (BID) | ORAL | 0 refills | Status: AC

## 2018-09-22 NOTE — Progress Notes (Signed)
Patient is to be discharged home and in stable condition. Patient's IV removed, WNL. Patient and family given discharge instructions and verbalized understanding. Patient escorted out by staff via wheelchair.   Celestia Khat, RN

## 2018-09-22 NOTE — Care Management (Signed)
DC summary faxed to [facility].

## 2018-09-22 NOTE — Progress Notes (Signed)
Patient's DNR form left at hospital by family. Unable to reach patient's family, multiple attempts to call have been made.  Celestia Khat, RN

## 2018-09-22 NOTE — Care Management (Signed)
Patient Information  SS# [id number]  Patient Name [name] [name], [name] [name] ([id number]) Sex Female DOB 1941/04/04  Room Bed  A338 A338-01  Patient Demographics   Address [place] [address] Phone [phone] (Home)  Patient Ethnicity & Race   Ethnic Group Patient Race  Not Hispanic or Latino White or Caucasian  Emergency Contact(s)   Name Relation Home Work Mobile  [place] Daughter [phone]    Documents on File    Status Date Received Description  Documents for the Patient  EMR Medication Summary Not Received    EMR Problem Summary Not Received    EMR Patient Summary Not Received    Driver's License Not Received    Historic Radiology Documentation Not Received    Historic Radiology Documentation Not Received    [place] Received 04/29/11   [place] E-Signature HIPAA Notice of Privacy Received 08/11/11   [place] E-Signature HIPAA Notice of Privacy Spanish Not Received    Insurance Card Received 11/27/12   Advance Directives/Living Will/HCPOA/POA Not Received    Insurance Card Not Received    Insurance Card Not Received    [place] Not Received    Financial Application Not Received    Insurance Card Not Received    Insurance Card Not Received    Insurance Card Not Received    Release of Information Not Received    Insurance Card Not Received    AMB Correspondence Not Received  05/14 MED REPORT UHC  AMB Correspondence Not Received  06/14 letter [place] Not Received  [facility]  Insurance Card     HIM ROI Authorization  08/23/14 ECS representing [employer] Card Received 11/27/14 UHC [place] / Medicaid  AMB Correspondence  12/31/14 10/15-12/15 OFFICENOTES [place] (Expired) 01/02/15 MAR's w/IV fluids, IV meds, blood transfusions, and immunizations for 12/30/14.   Release of Information  01/07/15   Other Photo ID Not Received    Insurance Card     HIM ROI Authorization (Expired) 03/17/16 Authorization for batch [employer]/UnitedHealthCare Medicare Adjustment Review  fbg  03/17/16  AMB Correspondence  03/02/16 OFFICE NOTE [facility]  AMB New Patient Records/Historical  12/14/16 02/18 REFERRAL [facility]  AMB New Patient Records/Historical  12/30/16 02/18 REFERRAL FROM [facility]  Insurance Card Received 01/07/17 UHC-[facility]  Insurance Card Received 01/07/17 MEDICAID-[facility]  Release of Information Received 01/07/17 ROI- [facility] Record  01/18/17 12/13-01/18 PROGRESS NOTES [facility]  HIM ROI Authorization (Expired) 04/15/17 Authorization for batch [employer]/Unitedhealthcare Medicare Risk Adjustment Review  mwc 04/15/2017  AMB New Patient Records/Historical  01/07/17 Tresa Endo MD, [facility] Documentation (Deleted) 01/15/11   Documents for the Encounter  AOB (Assignment of Insurance Benefits) Not Received    E-signature AOB Unable to Obtain 09/20/18   MEDICARE RIGHTS Not Received    E-signature Medicare Rights     ED Patient Billing Extract   ED PB Billing Extract  Medicare Observation  09/21/18   Admission Information   Attending Provider Admitting Provider Admission Type Admission Date/Time  Barton Dubois, MD Barton Dubois, MD Emergency 09/20/18 1243  Discharge Date Hospital Service Auth/Cert Status Service Area   Internal Medicine Incomplete [facility]  Unit Room/Bed Admission Status   AP-DEPT 300 A338/A338-01 Admission (Confirmed)   Admission   Complaint  facial pain  Hospital Account   Name  Acct ID Class Status Primary Coverage  Litzy, Dicker [id number] Inpatient Open UNITED HEALTHCARE MEDICARE - [facility] MEDICARE      Guarantor Account (for Hospital Account [id number])   Name Relation to Rison? Acct Type  Cathi Roan  Self CHSA Yes Personal/Family  Address Phone    [address] [place], [place] [address] [phone])        Coverage Information (for Hospital Account [id number])   1. [facility] MEDICARE/UHC MEDICARE   F/O Payor/Plan Precert #  [facility] Subscriber #  Corrisa, [place] [id number]  Address [place]  [address] [address] [phone]  2. MEDICAID [place]/MEDICAID [place] ACCESS   F/O Payor/Plan Precert #  MEDICAID [place]/MEDICAID [place] ACCESS   Subscriber Subscriber #  Martha, [place] [id number]  Address Phone  [address] [place], [place] [address] [phone]

## 2018-09-22 NOTE — Clinical Social Work Note (Signed)
Received CSW consult for hospice at home upon dc. RN CM aware and will assist with this need. Will clear the CSW consult at this time.

## 2018-09-22 NOTE — Progress Notes (Signed)
Patient has not urinated this shift. Patient unable to urinate when up to bedside commode. Bladder scan showing at most 92 mls. Will continue to monitor.

## 2018-09-22 NOTE — Discharge Summary (Signed)
Physician Discharge Summary  [name] [name]:[id number] DOB: 12-07-1940 DOA: 09/20/2018  PCP: Redmond School, MD  Admit date: 09/20/2018 Discharge date: 09/22/2018  Time spent: 35 minutes  Recommendations for Outpatient Follow-up:  1. If possible no further hospitalizations 2. Symptoms management and comfort care 3. 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  No having any orthostatic changes or acute complaints except for some discomfort in her left cheek wound.  Patient discharged home with instruction to follow-up with PCP and to follow recommendation by hospice care.  Complete antibiotic therapy as instructed.  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 Patient apparently release from oncology service after failing treatment with chemotherapy; advise to pursuit comfort care only. Patient is not having any fever or chills, but is experiencing worsening pain in her left cheek wound and is actively having purulent discharge and malodorous smell. She has not being able to eat or drink properly in the last couple days due to ongoing pain. During visit at her PCP BP was soft and patient felt to be dehydrated.   In ED, SBP in low 90's  initially, no fever and normal WBC's. Renal function and rest of electrolytes at baseline. IVF initiated, IV antibiotics started and palliative care consulted. TRH contacted to place in observation for further evaluation and management.  Hospital Course:  1-left cheek/squamous cell cancer and superimposed infection -Appreciate wound care services recommendations; continue to use peroxide application twice a day and follow instructions for dressing changes. -Patient wound with improvement in her odor and just mild serosanguineous discharge. -complete 10 days of doxycycline 100mg  BID -Patient has failed chemotherapy treatment as an outpatient and at this moment the plan is to pursuit hospice care. -concentrated moprhine prescribed to assist with pain management.   2-dehydration/hypotension -Significantly improved after fluid resuscitation -Vital signs within normal limits at discharge -Orthostatic changes.    3-urinary retention -In and out cath x1 provided -Most likely associated with the use of narcotics for pain -Prior to discharge is capable of urinating on her own.  4-chronic renal failure -Patient with a stage III chronic kidney disease at baseline. -Creatinine at baseline to a slightly improved with fluid resuscitation given -No further blood work anticipated as the patient is now looking to be followed by hospice for symptoms management and comfort care.  5-severe protein calorie malnutrition -Decision has been made for no PEG tube or artificial nutrition -Started on Glucerna and Magic cup puddings -Advised to maintain adequate hydration. -Comfort feeding encouraged.  Procedures:  None  Consultations:  Palliative care  Discharge Exam: Vitals:   09/22/18 0515 09/22/18 1452  BP: (!) 150/80 131/69  Pulse: (!) 105 (!) 109  Resp: 16 18  Temp: 98.2 F (36.8 C) 98.5 F (36.9 C)  SpO2: 100%  98%   General exam: Alert, awake, oriented x 3; reports poor appetite, but  has been able to drink more. Still with pain on her left cheek, but oral morphine helping. Patient is afebrile.  Respiratory system: Clear to auscultation. Respiratory effort normal. Cardiovascular system:RRR. No murmurs, rubs, gallops. Gastrointestinal system: Abdomen is nondistended, soft and nontender. No organomegaly or masses felt. Normal bowel sounds heard. Central nervous system: Alert and oriented. No focal neurological deficits. Extremities: No cyanosis or clubbing. Skin: No petechiae.  Patient with decrease odor and just mild serosanguineous drainage out of left cheek wound.  No surrounding erythema. Psychiatry: Judgement and insight appear normal.  Flat affect, no hallucinations.   Discharge Instructions   Discharge Instructions    Discharge instructions   Complete by:  As directed    Complete antibiotic therapy as instructed Keep yourself well-hydrated Arrange follow-up with PCP in the next 10 days Follow instructions on recommendation from hospice in order to continue providing care for your symptoms and conditions.     Allergies as of 09/22/2018      Reactions   Ciprofloxacin Anaphylaxis   Aspirin Nausea And Vomiting   Penicillins Nausea And Vomiting   Has patient had a PCN reaction causing immediate rash, facial/tongue/throat swelling, SOB or lightheadedness with hypotension: No Has patient had a PCN reaction causing severe rash involving mucus membranes or skin necrosis: No Has patient had a PCN reaction that required hospitalization: No Has patient had a PCN reaction occurring within the last 10 years: No If all of the above answers are "NO", then may proceed with Cephalosporin use.   Propoxyphene N-acetaminophen Nausea And Vomiting   Sulfamethoxazole Rash      Medication List    STOP taking these medications   atorvastatin 20 MG tablet Commonly known as:  LIPITOR   cephALEXin 500 MG capsule Commonly known as:  KEFLEX   cholecalciferol 1000 units  tablet Commonly known as:  VITAMIN D   NITROSTAT 0.4 MG SL tablet Generic drug:  nitroGLYCERIN     TAKE these medications   acetaminophen 500 MG tablet Commonly known as:  TYLENOL Take 1 tablet (500 mg total) by mouth every 6 (six) hours as needed for mild pain.   albuterol 4 MG tablet Commonly known as:  PROVENTIL Take by mouth.   ALPRAZolam 1 MG tablet Commonly known as:  XANAX Take 1 mg by mouth 4 (four) times daily.   amitriptyline 25 MG tablet Commonly known as:  ELAVIL Take by mouth.   clopidogrel 75 MG tablet Commonly known as:  PLAVIX Take 1 tablet (75 mg total) by mouth daily.   cyclobenzaprine 5 MG tablet Commonly known as:  FLEXERIL Take 1 tablet by mouth daily.   doxycycline 100 MG tablet Commonly known as:  VIBRA-TABS Take 1 tablet (100 mg total) by mouth 2 (two) times daily.   escitalopram 20 MG tablet Commonly known as:  LEXAPRO Take 20 mg by mouth daily.   feeding supplement Liqd Take 1 Container by mouth 3 (three) times daily between meals.   HYDROcodone-acetaminophen 10-325 MG tablet Commonly known as:  NORCO Take 1 tablet by mouth every 4 (four) hours as needed.   hydrogen peroxide 3 % external solution Apply topically 2 (two) times daily.   magnesium oxide 400 (241.3 Mg) MG tablet Commonly known as:  MAG-OX Take 2 tablets by mouth 2 (two) times daily.   morphine CONCENTRATE 10 MG/0.5ML Soln concentrated solution Take 0.5 mLs (10 mg total) by mouth every 4 (four)  hours as needed for severe pain.   NEXIUM 40 MG capsule Generic drug:  esomeprazole   ondansetron 4 MG tablet Commonly known as:  ZOFRAN Take 1 tablet by mouth daily as needed.   ROZEREM 8 MG tablet Generic drug:  ramelteon Take 8 mg by mouth at bedtime.   traMADol 50 MG tablet Commonly known as:  ULTRAM Take 1 tablet by mouth as needed.      Allergies  Allergen Reactions  . Ciprofloxacin Anaphylaxis  . Aspirin Nausea And Vomiting  . Penicillins Nausea And  Vomiting    Has patient had a PCN reaction causing immediate rash, facial/tongue/throat swelling, SOB or lightheadedness with hypotension: No Has patient had a PCN reaction causing severe rash involving mucus membranes or skin necrosis: No Has patient had a PCN reaction that required hospitalization: No Has patient had a PCN reaction occurring within the last 10 years: No If all of the above answers are "NO", then may proceed with Cephalosporin use.   Marland Kitchen Propoxyphene N-Acetaminophen Nausea And Vomiting  . Sulfamethoxazole Rash   Follow-up Information    Redmond School, MD. Schedule an appointment as soon as possible for a visit in 10 day(s).   Specialty:  Internal Medicine Contact information: [address] [place] [address] [phone]           The results of significant diagnostics from this hospitalization (including imaging, microbiology, ancillary and laboratory) are listed below for reference.    Significant Diagnostic Studies: No results found.  Microbiology: No results found for this or any previous visit (from the past 240 hour(s)).   Labs: Basic Metabolic Panel: Recent Labs  Lab 09/20/18 1331 09/20/18 1340 09/21/18 0546 09/22/18 0700  NA  --  142 143  --   K  --  4.1 4.1  --   CL  --  110 112*  --   CO2  --  26 23  --   GLUCOSE  --  90 82  --   BUN  --  17 15  --   CREATININE  --  1.45* 1.17* 1.13*  CALCIUM  --  10.1 9.7  --   MG 2.5*  --   --   --   PHOS 2.9  --   --   --    Liver Function Tests: Recent Labs  Lab 09/20/18 1340  AST 12*  ALT 9  ALKPHOS 87  BILITOT 0.6  PROT 7.0  ALBUMIN 3.0*   CBC: Recent Labs  Lab 09/20/18 1340 09/21/18 0546  WBC 9.1 10.9*  NEUTROABS 6.7  --   HGB 13.1 12.4  HCT 42.9 41.9  MCV 93.3 93.9  PLT 288 271   CBG: Recent Labs  Lab 09/21/18 1126 09/21/18 1616 09/21/18 2113 09/22/18 0748 09/22/18 1117  GLUCAP 141* 97 116* 83 129*    Signed:  Barton Dubois MD.  [facility] 09/22/2018, 3:13 PM

## 2018-09-22 NOTE — Care Management Note (Signed)
Case Management Note  Patient Details  Name: [name] [name] MRN: [id number] Date of Birth: 05/26/1941   Expected Discharge Date:       09/22/18           Expected Discharge Plan:  Home w Hospice Care  In-House Referral:  Hospice / Palliative Care  Discharge planning Services  CM Consult  Post Acute Care Choice:  Hospice Choice offered to:  Adult Children, Patient  DME Arranged:    DME Agency:     HH Arranged:  RN [place] Agency:  [facility]  Status of Service:  In process, will continue to follow  If discussed at Long Length of Stay Meetings, dates discussed:    Additional Comments: CM saw for hospice referral. Pt defers choice to daughter Salvatore Marvel has no preference of Hospice provider. Okay with [facility]. CM has routed pt info for referral. Will follow up on specifics of DC date/time once available.   Sherald Barge, RN 09/22/2018, 9:15 AM

## 2018-09-22 NOTE — Progress Notes (Signed)
Patient's family came to pick up DNR at 3rd floor nurse's station, DNR sent home with son in law.  Celestia Khat, RN

## 2018-09-27 DIAGNOSIS — N183 Chronic kidney disease, stage 3 (moderate): Secondary | ICD-10-CM | POA: Diagnosis not present

## 2018-09-27 DIAGNOSIS — C069 Malignant neoplasm of mouth, unspecified: Secondary | ICD-10-CM | POA: Diagnosis not present

## 2018-10-10 ENCOUNTER — Emergency Department (HOSPITAL_COMMUNITY)
Admission: EM | Admit: 2018-10-10 | Discharge: 2018-10-10 | Disposition: A | Discharge status: Home / Self Care | Payer: Medicare Other | Attending: Emergency Medicine | Admitting: Emergency Medicine

## 2018-10-10 ENCOUNTER — Encounter (HOSPITAL_COMMUNITY): Payer: Self-pay | Admitting: *Deleted

## 2018-10-10 ENCOUNTER — Other Ambulatory Visit: Payer: Self-pay

## 2018-10-10 DIAGNOSIS — N184 Chronic kidney disease, stage 4 (severe): Secondary | ICD-10-CM | POA: Diagnosis not present

## 2018-10-10 DIAGNOSIS — E119 Type 2 diabetes mellitus without complications: Secondary | ICD-10-CM | POA: Insufficient documentation

## 2018-10-10 DIAGNOSIS — R531 Weakness: Secondary | ICD-10-CM | POA: Diagnosis not present

## 2018-10-10 DIAGNOSIS — F17220 Nicotine dependence, chewing tobacco, uncomplicated: Secondary | ICD-10-CM | POA: Diagnosis not present

## 2018-10-10 DIAGNOSIS — I129 Hypertensive chronic kidney disease with stage 1 through stage 4 chronic kidney disease, or unspecified chronic kidney disease: Secondary | ICD-10-CM | POA: Diagnosis not present

## 2018-10-10 DIAGNOSIS — I959 Hypotension, unspecified: Secondary | ICD-10-CM | POA: Diagnosis not present

## 2018-10-10 DIAGNOSIS — Z7902 Long term (current) use of antithrombotics/antiplatelets: Secondary | ICD-10-CM | POA: Insufficient documentation

## 2018-10-10 DIAGNOSIS — E860 Dehydration: Secondary | ICD-10-CM | POA: Diagnosis not present

## 2018-10-10 DIAGNOSIS — Z743 Need for continuous supervision: Secondary | ICD-10-CM | POA: Diagnosis not present

## 2018-10-10 DIAGNOSIS — R110 Nausea: Secondary | ICD-10-CM | POA: Diagnosis not present

## 2018-10-10 DIAGNOSIS — Z79899 Other long term (current) drug therapy: Secondary | ICD-10-CM | POA: Diagnosis not present

## 2018-10-10 LAB — COMPREHENSIVE METABOLIC PANEL
ALK PHOS: 89 U/L (ref 38–126)
ALT: 20 U/L (ref 0–44)
AST: 30 U/L (ref 15–41)
Albumin: 2.7 g/dL — ABNORMAL LOW (ref 3.5–5.0)
Anion gap: 5 (ref 5–15)
BILIRUBIN TOTAL: 0.7 mg/dL (ref 0.3–1.2)
BUN: 33 mg/dL — ABNORMAL HIGH (ref 8–23)
CALCIUM: 10.4 mg/dL — AB (ref 8.9–10.3)
CO2: 22 mmol/L (ref 22–32)
Chloride: 115 mmol/L — ABNORMAL HIGH (ref 98–111)
Creatinine, Ser: 1.82 mg/dL — ABNORMAL HIGH (ref 0.44–1.00)
GFR, EST AFRICAN AMERICAN: 31 mL/min — AB (ref >60)
GFR, EST NON AFRICAN AMERICAN: 26 mL/min — AB (ref >60)
Glucose, Bld: 106 mg/dL — ABNORMAL HIGH (ref 70–99)
Potassium: 4 mmol/L (ref 3.5–5.1)
Sodium: 142 mmol/L (ref 135–145)
TOTAL PROTEIN: 6.4 g/dL — AB (ref 6.5–8.1)

## 2018-10-10 LAB — CBC WITH DIFFERENTIAL/PLATELET
Abs Immature Granulocytes: 0.02 10*3/uL (ref 0.00–0.07)
BASOS ABS: 0.1 10*3/uL (ref 0.0–0.1)
BASOS PCT: 1 %
EOS ABS: 0.2 10*3/uL (ref 0.0–0.5)
Eosinophils Relative: 2 %
HCT: 38.6 % (ref 36.0–46.0)
Hemoglobin: 11.7 g/dL — ABNORMAL LOW (ref 12.0–15.0)
IMMATURE GRANULOCYTES: 0 %
Lymphocytes Relative: 23 %
Lymphs Abs: 2.2 10*3/uL (ref 0.7–4.0)
MCH: 27.8 pg (ref 26.0–34.0)
MCHC: 30.3 g/dL (ref 30.0–36.0)
MCV: 91.7 fL (ref 80.0–100.0)
Monocytes Absolute: 0.9 10*3/uL (ref 0.1–1.0)
Monocytes Relative: 9 %
NEUTROS PCT: 65 %
NRBC: 0 % (ref 0.0–0.2)
Neutro Abs: 6.4 10*3/uL (ref 1.7–7.7)
Platelets: 268 10*3/uL (ref 150–400)
RBC: 4.21 MIL/uL (ref 3.87–5.11)
RDW: 15.4 % (ref 11.5–15.5)
WBC: 9.8 10*3/uL (ref 4.0–10.5)

## 2018-10-10 MED ORDER — SODIUM CHLORIDE 0.9 % IV BOLUS
1000.0000 mL | Freq: Once | INTRAVENOUS | Status: AC
  Administered 2018-10-10: 1000 mL via INTRAVENOUS

## 2018-10-10 NOTE — ED Triage Notes (Signed)
Patient family called EMS, complaints of patient not "eating or drinking" but unable to confirm how long.  Patient states "long time".  EMS reports patient with confusion but able to walk about home on own.  CBG per EMS 119, BP 80/50, HR 82, 95% room air.  Patient with history of cancer without chemotherapy treatments.

## 2018-10-10 NOTE — ED Provider Notes (Signed)
[facility] EMERGENCY DEPARTMENT Provider Note   CSN: [id number] Arrival date & time: 10/10/18  1552     History   Chief Complaint Chief Complaint  Patient presents with  . Failure To Thrive    HPI [name] [name] is a 77 y.o. female.  HPI Patient presents with a son who assists with the HPI. Patient presents due to weakness. Patient was seen here about 1 month ago, by myself, admitted. She notes that since discharge she has had persistent weakness, discomfort about her left cheek. Patient has notable history of squamous cell malignancy, without ongoing chemotherapy due to lack of response. She also has concurrent infection in the affected area of her left cheek, notes continued purulent drainage from this area. It seems that there has been no recent fever, no vomiting, no fall, no syncope, no other new pain. Patient notes ongoing anorexia, nausea, generalized weakness, and today, was found to be hypotensive, according to the patient's son, prompting ED evaluation. Patient notes that after her recent hospitalization she did not pursue a relationship with hospice, for unclear reasons. Family notes that this was the recommendation by the patient's oncology team, and internal medicine team, on discharge 1 month ago.  Past Medical History:  Diagnosis Date  . Anxiety   . Cancer ([place])    left facial  . Depression   . Diabetes mellitus without complication ([place])   . GERD (gastroesophageal reflux disease)   . Hyperlipidemia   . Hypertension   . Myocardial infarction ([place])   . Renal disorder    kidney failure   . Stroke [facility])    times 2    Patient Active Problem List   Diagnosis Date Noted  . DNR (do not resuscitate)   . Goals of care, counseling/discussion   . Palliative care encounter   . Hypotension 09/20/2018  . Wound infection 09/20/2018  . Hypercalcemia 08/23/2018  . Squamous cell cancer of buccal mucosa ([place]) 01/07/2017  . Chronic kidney disease (CKD), stage  IV (severe) ([place]) 01/07/2017  . Dehydration 03/10/2015  . Protein-calorie malnutrition, severe ([place]) 12/24/2014  . Metabolic acidosis 60/08/9322  . Hypovolemic shock ([place]) 12/24/2014  . Encephalopathy acute 10/21/2014  . CKD (chronic kidney disease) 10/21/2014  . Fall at home 10/21/2014  . H/O: CVA (cerebrovascular accident) 10/21/2014  . Altered bowel elimination due to intestinal ostomy ([place]) 10/21/2014  . 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 Acute on chronic renal failure ([place]) 03/11/2014  . Contusion of left hip 03/10/2014  . Hip pain 03/10/2014  . DOE (dyspnea on exertion) 08/18/2012  . Chest pain 08/04/2011  . Tobacco dipper 04/29/2011  . Anxiety   . Depression   . Hypertension   . GERD (gastroesophageal reflux disease)   . Hyperlipidemia   . Myocardial infarction ([place])   . Stroke ([place])   . CAD, NATIVE VESSEL 02/20/2010  . SHOULDER PAIN 10/09/2007  . RUPTURE ROTATOR CUFF 10/09/2007    Past Surgical History:  Procedure Laterality Date  . ABDOMINAL HYSTERECTOMY    . CATARACT EXTRACTION W/PHACO Right 03/20/2013   Procedure: CATARACT EXTRACTION PHACO AND INTRAOCULAR LENS PLACEMENT (IOC);  Surgeon: Elta Guadeloupe T. Gershon Crane, MD;  Location: [facility];  Service: Ophthalmology;  Laterality: Right;  CDE:  6.29   . CHOLECYSTECTOMY    . COLOSTOMY    . OOPHORECTOMY    . TONSILLECTOMY       OB History   None  Home Medications    Prior to Admission medications   Medication Sig Start Date End Date Taking? Authorizing Provider  acetaminophen (TYLENOL) 500 MG tablet Take 1 tablet (500 mg total) by mouth every 6 (six) hours as needed for mild pain. 12/30/14   Samuella Cota, MD  albuterol (PROVENTIL) 4 MG tablet Take by mouth. 07/03/12   [provider]  ALPRAZolam Duanne Moron) 1 MG tablet Take 1 mg by mouth 4 (four) times daily.  09/05/18   [provider]  amitriptyline (ELAVIL) 25 MG tablet Take by mouth.  12/21/11   [provider]  clopidogrel (PLAVIX) 75 MG tablet Take 1 tablet (75 mg total) by mouth daily. 09/26/12   Wall, Marijo Conception, MD  cyclobenzaprine (FLEXERIL) 5 MG tablet Take 1 tablet by mouth daily. 08/07/18   [provider]  doxycycline (VIBRA-TABS) 100 MG tablet Take 1 tablet (100 mg total) by mouth 2 (two) times daily. 09/22/18   Barton Dubois, MD  escitalopram (LEXAPRO) 20 MG tablet Take 20 mg by mouth daily.    [provider]  esomeprazole (NEXIUM) 40 MG capsule  12/21/11   [provider]  feeding supplement, RESOURCE BREEZE, (RESOURCE BREEZE) LIQD Take 1 Container by mouth 3 (three) times daily between meals. 12/30/14   Samuella Cota, MD  HYDROcodone-acetaminophen [facility]) 10-325 MG tablet Take 1 tablet by mouth every 4 (four) hours as needed for moderate pain.     [provider]  hydrogen peroxide 3 % external solution Apply topically 2 (two) times daily. 09/22/18   Barton Dubois, MD  magnesium oxide (MAG-OX) 400 (241.3 Mg) MG tablet Take 2 tablets by mouth 2 (two) times daily. 08/15/18   [provider]  Morphine Sulfate (MORPHINE CONCENTRATE) 10 MG/0.5ML SOLN concentrated solution Take 0.5 mLs (10 mg total) by mouth every 4 (four) hours as needed for severe pain. 09/22/18   Barton Dubois, MD  ondansetron (ZOFRAN) 4 MG tablet Take 1 tablet by mouth daily as needed. 08/15/18   [provider]  ROZEREM 8 MG tablet Take 8 mg by mouth at bedtime. 12/08/16   [provider]  traMADol (ULTRAM) 50 MG tablet Take 1 tablet by mouth as needed. 08/15/18   [provider]    Family History Family History  Problem Relation Age of Onset  . Cancer Other   . Diabetes Other   . Abnormal EKG Other   . Arthritis Other   . Asthma Other   . Kidney disease Other     Social History Social History   Tobacco Use  . Smoking status: Former Smoker    Packs/day: 0.25    Years: 15.00    Pack years: 3.75    Types:  Cigarettes    Start date: 12/12/1956    Last attempt to quit: 08/18/1972    Years since quitting: 46.1  . Smokeless tobacco: Current User    Types: Snuff  . Tobacco comment: 1-2 cigs daily x 6 months  Substance Use Topics  . Alcohol use: No    Alcohol/week: 0.0 standard drinks  . Drug use: No     Allergies   Ciprofloxacin; Aspirin; Penicillins; Propoxyphene n-acetaminophen; and Sulfamethoxazole   Review of Systems Review of Systems  Constitutional:       Per HPI, otherwise negative  HENT:       Per HPI, otherwise negative  Respiratory:       Per HPI, otherwise negative  Cardiovascular:       Per HPI, otherwise  negative  Gastrointestinal: Positive for nausea. Negative for vomiting.  Endocrine:       Negative aside from HPI  Genitourinary:       Neg aside from HPI   Musculoskeletal:       Per HPI, otherwise negative  Skin: Positive for wound.  Allergic/Immunologic: Positive for immunocompromised state.  Neurological: Positive for weakness. Negative for syncope.     Physical Exam Updated Vital Signs BP 93/60 (BP Location: Left Arm)   Pulse 87   Temp 98.2 F (36.8 C) (Oral)   Resp 10   Ht 5' (1.524 m)   Wt 60.3 kg   SpO2 100%   BMI 25.97 kg/m   Physical Exam  Constitutional: She is oriented to person, place, and time. She appears cachectic. She has a sickly appearance.  HENT:  Head: Normocephalic and atraumatic.    Eyes: Conjunctivae and EOM are normal.  Cardiovascular: Normal rate and regular rhythm.  Pulmonary/Chest: Effort normal and breath sounds normal. No stridor. No respiratory distress.  Abdominal: She exhibits no distension.  Musculoskeletal: She exhibits no edema.  Neurological: She is alert and oriented to person, place, and time. No cranial nerve deficit.  Skin: Skin is warm and dry.  Psychiatric: She has a normal mood and affect.  Nursing note and vitals reviewed.    ED Treatments / Results  Labs (all labs ordered are listed, but only  abnormal results are displayed) Labs Reviewed  COMPREHENSIVE METABOLIC PANEL - Abnormal; Notable for the following components:      Result Value   Chloride 115 (*)    Glucose, Bld 106 (*)    BUN 33 (*)    Creatinine, Ser 1.82 (*)    Calcium 10.4 (*)    Total Protein 6.4 (*)    Albumin 2.7 (*)    GFR calc non Af Amer 26 (*)    GFR calc Af Amer 31 (*)    All other components within normal limits  CBC WITH DIFFERENTIAL/PLATELET - Abnormal; Notable for the following components:   Hemoglobin 11.7 (*)    All other components within normal limits    EKG EKG Interpretation  Date/Time:  Tuesday October 10 2018 15:55:27 EST Ventricular Rate:  86 PR Interval:    QRS Duration: 84 QT Interval:  341 QTC Calculation: 408 R Axis:   -44 Text Interpretation:  Sinus rhythm Atrial premature complex Anterolateral infarct, old Baseline wander in lead(s) V6 No significant change since last tracing Abnormal ekg Confirmed by Carmin Muskrat [phone]) on 10/10/2018 4:03:55 PM   Radiology No results found.  Procedures Procedures (including critical care time)  Medications Ordered in ED Medications  sodium chloride 0.9 % bolus 1,000 mL (has no administration in time range)     Initial Impression / Assessment and Plan / ED Course  I have reviewed the triage vital signs and the nursing notes.  Pertinent labs & imaging results that were available during my care of the patient were reviewed by me and considered in my medical decision making (see chart for details).    After the initial evaluation I reviewed the patient's chart including documentation from my visit 1 month ago, and hospitalist evaluation and discharge instructions, including recommendation for hospice care. 6:15 PM Patient has received nearly 1 L of fluid, blood pressure has essentially normalized. I had multiple conversations with multiple family members about her prognosis, and hospice care. Patient seems hesitant to enroll her  services, though family members acknowledge the importance of having a conversation  with hospice. Patient's evaluation otherwise unremarkable, no evidence for bacteremia, sepsis, no evidence for airway compromise, physical exam findings consistent with progression of disease, and given the patient is no longer receiving chemotherapy, has been designated to not benefit from additional interventions, again advocated for hospice care, and the patient was discharged after fluids improved her blood pressure, energy and she was drinking orally as well.   Final Clinical Impressions(s) / ED Diagnoses  Dehydration   Carmin Muskrat, MD 10/10/18 1816

## 2018-10-10 NOTE — ED Notes (Signed)
Dressing changed.

## 2018-10-10 NOTE — Discharge Instructions (Addendum)
Please be sure to use the previously provided information about hospice care, to contact them for additional services.

## 2018-10-19 ENCOUNTER — Encounter (HOSPITAL_COMMUNITY): Payer: Self-pay

## 2018-10-19 ENCOUNTER — Emergency Department (HOSPITAL_COMMUNITY)
Admission: EM | Admit: 2018-10-19 | Discharge: 2018-10-19 | Disposition: A | Discharge status: Home / Self Care | Payer: Medicare Other | Attending: Emergency Medicine | Admitting: Emergency Medicine

## 2018-10-19 ENCOUNTER — Other Ambulatory Visit: Payer: Self-pay

## 2018-10-19 ENCOUNTER — Emergency Department (HOSPITAL_COMMUNITY): Payer: Medicare Other

## 2018-10-19 DIAGNOSIS — I9589 Other hypotension: Secondary | ICD-10-CM

## 2018-10-19 DIAGNOSIS — I129 Hypertensive chronic kidney disease with stage 1 through stage 4 chronic kidney disease, or unspecified chronic kidney disease: Secondary | ICD-10-CM | POA: Diagnosis not present

## 2018-10-19 DIAGNOSIS — R531 Weakness: Secondary | ICD-10-CM | POA: Diagnosis not present

## 2018-10-19 DIAGNOSIS — R402412 Glasgow coma scale score 13-15, at arrival to emergency department: Secondary | ICD-10-CM | POA: Diagnosis not present

## 2018-10-19 DIAGNOSIS — E119 Type 2 diabetes mellitus without complications: Secondary | ICD-10-CM | POA: Insufficient documentation

## 2018-10-19 DIAGNOSIS — Z87891 Personal history of nicotine dependence: Secondary | ICD-10-CM | POA: Insufficient documentation

## 2018-10-19 DIAGNOSIS — E861 Hypovolemia: Secondary | ICD-10-CM

## 2018-10-19 DIAGNOSIS — N184 Chronic kidney disease, stage 4 (severe): Secondary | ICD-10-CM | POA: Diagnosis not present

## 2018-10-19 DIAGNOSIS — E860 Dehydration: Secondary | ICD-10-CM

## 2018-10-19 DIAGNOSIS — Z79899 Other long term (current) drug therapy: Secondary | ICD-10-CM | POA: Diagnosis not present

## 2018-10-19 DIAGNOSIS — R4182 Altered mental status, unspecified: Secondary | ICD-10-CM | POA: Diagnosis not present

## 2018-10-19 DIAGNOSIS — I959 Hypotension, unspecified: Secondary | ICD-10-CM | POA: Insufficient documentation

## 2018-10-19 DIAGNOSIS — Z7902 Long term (current) use of antithrombotics/antiplatelets: Secondary | ICD-10-CM | POA: Diagnosis not present

## 2018-10-19 DIAGNOSIS — J9811 Atelectasis: Secondary | ICD-10-CM | POA: Diagnosis not present

## 2018-10-19 LAB — URINALYSIS, ROUTINE W REFLEX MICROSCOPIC
Bilirubin Urine: NEGATIVE
GLUCOSE, UA: NEGATIVE mg/dL
HGB URINE DIPSTICK: NEGATIVE
KETONES UR: NEGATIVE mg/dL
Leukocytes, UA: NEGATIVE
NITRITE: NEGATIVE
PH: 5 (ref 5.0–8.0)
PROTEIN: NEGATIVE mg/dL
Specific Gravity, Urine: 1.018 (ref 1.005–1.030)

## 2018-10-19 LAB — COMPREHENSIVE METABOLIC PANEL
ALK PHOS: 94 U/L (ref 38–126)
ALT: 26 U/L (ref 0–44)
AST: 33 U/L (ref 15–41)
Albumin: 2.5 g/dL — ABNORMAL LOW (ref 3.5–5.0)
Anion gap: 8 (ref 5–15)
BILIRUBIN TOTAL: 0.7 mg/dL (ref 0.3–1.2)
BUN: 41 mg/dL — ABNORMAL HIGH (ref 8–23)
CO2: 21 mmol/L — ABNORMAL LOW (ref 22–32)
CREATININE: 2.19 mg/dL — AB (ref 0.44–1.00)
Calcium: 10.3 mg/dL (ref 8.9–10.3)
Chloride: 110 mmol/L (ref 98–111)
GFR, EST AFRICAN AMERICAN: 24 mL/min — AB (ref >60)
GFR, EST NON AFRICAN AMERICAN: 21 mL/min — AB (ref >60)
Glucose, Bld: 155 mg/dL — ABNORMAL HIGH (ref 70–99)
POTASSIUM: 3.4 mmol/L — AB (ref 3.5–5.1)
Sodium: 139 mmol/L (ref 135–145)
Total Protein: 6.2 g/dL — ABNORMAL LOW (ref 6.5–8.1)

## 2018-10-19 LAB — CBC WITH DIFFERENTIAL/PLATELET
Abs Immature Granulocytes: 0.07 10*3/uL (ref 0.00–0.07)
Basophils Absolute: 0 10*3/uL (ref 0.0–0.1)
Basophils Relative: 0 %
Eosinophils Absolute: 0 10*3/uL (ref 0.0–0.5)
Eosinophils Relative: 0 %
HEMATOCRIT: 43.4 % (ref 36.0–46.0)
Hemoglobin: 13.1 g/dL (ref 12.0–15.0)
IMMATURE GRANULOCYTES: 1 %
Lymphocytes Relative: 12 %
Lymphs Abs: 1.7 10*3/uL (ref 0.7–4.0)
MCH: 27.2 pg (ref 26.0–34.0)
MCHC: 30.2 g/dL (ref 30.0–36.0)
MCV: 90 fL (ref 80.0–100.0)
MONO ABS: 1 10*3/uL (ref 0.1–1.0)
Monocytes Relative: 7 %
NEUTROS PCT: 80 %
NRBC: 0 % (ref 0.0–0.2)
Neutro Abs: 11.3 10*3/uL — ABNORMAL HIGH (ref 1.7–7.7)
PLATELETS: 187 10*3/uL (ref 150–400)
RBC: 4.82 MIL/uL (ref 3.87–5.11)
RDW: 15.4 % (ref 11.5–15.5)
WBC: 14.1 10*3/uL — ABNORMAL HIGH (ref 4.0–10.5)

## 2018-10-19 MED ORDER — SODIUM CHLORIDE 0.9 % IV BOLUS
1000.0000 mL | Freq: Once | INTRAVENOUS | Status: AC
  Administered 2018-10-19: 1000 mL via INTRAVENOUS

## 2018-10-19 NOTE — Discharge Instructions (Signed)
You are seen in the emergency room due to low blood pressure and altered mental status.  Your abdomen status improved.  We did a work-up on your labs and looking for infection.  All that was normal.  We provided you with fluids and your blood pressure improved.  We recommend that you follow-up with your regular doctor just to have a blood pressure check.  We have also put in a referral for social work to assist you with further home health care needs.

## 2018-10-19 NOTE — ED Triage Notes (Signed)
EMS reports pt has cancer and is a hospice pt.  REports increased confusion, generalized weakness, and hypotension.  Pt took hydrocodone and morphine pta.  Pt alert at this time.  Pt reported a headache pta but denies any pain at this time.  BP was 97/59, initially bp was 84/55 and ems started nss bolus.  CBG 187.  HR 102

## 2018-10-19 NOTE — ED Provider Notes (Signed)
[facility] EMERGENCY DEPARTMENT Provider Note   CSN: [id number] Arrival date & time: 10/19/18  1435     History   Chief Complaint Chief Complaint  Patient presents with  . Hypotension    HPI [name] [name] is a 77 y.o. female.  Patient presents via EMS to the ED for decreased arousal noticed by her family.  Also found to be hypertensive.  Patient has a history of metastatic squamous cell cancer with multiple lesions on her face.  Patient was recently enrolled in hospice where she has been taking approximately 50 morphine equivalents a day and morphine as well as an unknown amount of oxycodone.  Patient arrived here today and was more alert with an improved blood pressure 97/50.  Patient is chronically ill and has decreased p.o.  Patient no longer wants hospice interventions and wants "whatever it takes".  There is no family at bedside.  Patient was just in the ED approximately 1 week ago for the same presentation.  The history is provided by the patient and a relative.    Past Medical History:  Diagnosis Date  . Anxiety   . Cancer ([place])    left facial  . Depression   . Diabetes mellitus without complication ([place])   . GERD (gastroesophageal reflux disease)   . Hyperlipidemia   . Hypertension   . Myocardial infarction ([place])   . Renal disorder    kidney failure   . Stroke [facility])    times 2    Patient Active Problem List   Diagnosis Date Noted  . DNR (do not resuscitate)   . Goals of care, counseling/discussion   . Palliative care encounter   . Hypotension 09/20/2018  . Wound infection 09/20/2018  . Hypercalcemia 08/23/2018  . Squamous cell cancer of buccal mucosa ([place]) 01/07/2017  . Chronic kidney disease (CKD), stage IV (severe) ([place]) 01/07/2017  . Dehydration 03/10/2015  . Protein-calorie malnutrition, severe ([place]) 12/24/2014  . Metabolic acidosis 67/54/4920  . Hypovolemic shock ([place]) 12/24/2014  . Encephalopathy acute 10/21/2014  . CKD (chronic kidney  disease) 10/21/2014  . Fall at home 10/21/2014  . H/O: CVA (cerebrovascular accident) 10/21/2014  . Altered bowel elimination due to intestinal ostomy ([place]) 10/21/2014  . 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 Acute on chronic renal failure ([place]) 03/11/2014  . Contusion of left hip 03/10/2014  . Hip pain 03/10/2014  . DOE (dyspnea on exertion) 08/18/2012  . Chest pain 08/04/2011  . Tobacco dipper 04/29/2011  . Anxiety   . Depression   . Hypertension   . GERD (gastroesophageal reflux disease)   . Hyperlipidemia   . Myocardial infarction ([place])   . Stroke ([place])   . CAD, NATIVE VESSEL 02/20/2010  . SHOULDER PAIN 10/09/2007  . RUPTURE ROTATOR CUFF 10/09/2007    Past Surgical History:  Procedure Laterality Date  . ABDOMINAL HYSTERECTOMY    . CATARACT EXTRACTION W/PHACO Right 03/20/2013   Procedure: CATARACT EXTRACTION PHACO AND INTRAOCULAR LENS PLACEMENT (IOC);  Surgeon: Elta Guadeloupe T. Gershon Crane, MD;  Location: [facility]S;  Service: Ophthalmology;  Laterality: Right;  CDE:  6.29   . CHOLECYSTECTOMY    . COLOSTOMY    . OOPHORECTOMY    . TONSILLECTOMY       OB History   No obstetric history on file.      Home Medications    Prior to Admission medications   Medication Sig Start Date End Date Taking? Authorizing Provider  acetaminophen (TYLENOL) 500 MG tablet Take 1 tablet (500 mg total) by mouth every 6 (six) hours as needed for mild pain. 12/30/14  Yes Samuella Cota, MD  doxycycline (VIBRA-TABS) 100 MG tablet Take 1 tablet (100 mg total) by mouth 2 (two) times daily. 09/22/18  Yes Barton Dubois, MD  HYDROcodone-acetaminophen [facility]) 10-325 MG tablet Take 1 tablet by mouth every 4 (four) hours as needed for moderate pain.    Yes [provider]  Morphine Sulfate (MORPHINE CONCENTRATE) 10 MG/0.5ML SOLN concentrated solution Take 0.5 mLs (10 mg total) by mouth every 4 (four) hours as needed for severe pain. 09/22/18  Yes  Barton Dubois, MD  ondansetron (ZOFRAN) 4 MG tablet Take 1 tablet by mouth daily as needed. 08/15/18  Yes [provider]  clopidogrel (PLAVIX) 75 MG tablet Take 1 tablet (75 mg total) by mouth daily. Patient not taking: Reported on 10/19/2018 09/26/12   Wall, Marijo Conception, MD  hydrogen peroxide 3 % external solution Apply topically 2 (two) times daily. Patient not taking: Reported on 10/19/2018 09/22/18   Barton Dubois, MD    Family History Family History  Problem Relation Age of Onset  . Cancer Other   . Diabetes Other   . Abnormal EKG Other   . Arthritis Other   . Asthma Other   . Kidney disease Other     Social History Social History   Tobacco Use  . Smoking status: Former Smoker    Packs/day: 0.25    Years: 15.00    Pack years: 3.75    Types: Cigarettes    Start date: 12/12/1956    Last attempt to quit: 08/18/1972    Years since quitting: 46.2  . Smokeless tobacco: Current User    Types: Snuff  . Tobacco comment: 1-2 cigs daily x 6 months  Substance Use Topics  . Alcohol use: No    Alcohol/week: 0.0 standard drinks  . Drug use: No     Allergies   Ciprofloxacin; Aspirin; Penicillins; Propoxyphene n-acetaminophen; and Sulfamethoxazole   Review of Systems Review of Systems   Physical Exam Updated Vital Signs BP (!) 84/53 (BP Location: Right Arm)   Pulse 84   Temp 98.5 F (36.9 C) (Oral)   Resp 12   Ht 5' (1.524 m)   Wt 60.3 kg   SpO2 100%   BMI 25.96 kg/m   Physical Examhysical Exam  Constitutional: She is oriented to person, place, and time. She appears cachectic. She has a sickly appearance.  HENT:  Head: Normocephalic and atraumatic.    Eyes: Conjunctivae and EOM are normal.  Cardiovascular: Normal rate and regular rhythm.  Pulmonary/Chest: Effort normal and breath sounds normal. No stridor. No respiratory distress.  Abdominal: She exhibits no distension.  Musculoskeletal: She exhibits no edema.  Neurological: She is alert and oriented  to person, place, and time. No cranial nerve deficit.  Skin: Skin is warm and dry.  Psychiatric: She has a normal mood and affect.  Nursing note and vitals reviewed.    ED Treatments / Results  Labs (all labs ordered are listed, but only abnormal results are displayed) Labs Reviewed  COMPREHENSIVE METABOLIC PANEL - Abnormal; Notable for the following components:      Result Value   Potassium 3.4 (*)    CO2 21 (*)    Glucose, Bld 155 (*)    BUN 41 (*)    Creatinine, Ser 2.19 (*)    Total Protein 6.2 (*)    Albumin 2.5 (*)  GFR calc non Af Amer 21 (*)    GFR calc Af Amer 24 (*)    All other components within normal limits  CBC WITH DIFFERENTIAL/PLATELET - Abnormal; Notable for the following components:   WBC 14.1 (*)    Neutro Abs 11.3 (*)    All other components within normal limits  CBC  URINALYSIS, ROUTINE W REFLEX MICROSCOPIC    EKG None  Radiology No results found.  Procedures Procedures (including critical care time)  Medications Ordered in ED Medications  sodium chloride 0.9 % bolus 1,000 mL (1,000 mLs Intravenous New Bag/Given 10/19/18 1703)  sodium chloride 0.9 % bolus 1,000 mL (0 mLs Intravenous Stopped 10/19/18 1654)     Initial Impression / Assessment and Plan / ED Course  I have reviewed the triage vital signs and the nursing notes.  Pertinent labs & imaging results that were available during my care of the patient were reviewed by me and considered in my medical decision making (see chart for details).  Clinical Course as of Oct 19 1742  Thu Oct 19, 2018  1731 WBC(!): 14.1 [AT]    Clinical Course User Index [AT] Bonnita Hollow, MD    Patient presenting with initial altered mental status and chronic dehydration in setting of metastatic cancer.  Upon arrival mental status improved.  Patient wanting full intervention.  No history of infectious symptoms. Will routine labs CBC, CMP, and start fluids.  Plan a further discussion with  family.  Interval update 4 PM Spoke with family and patient.  Family is wanting patient to go on hospice, patient does not want to go on hospice.  Blood pressures now returned to normal limits.  Patient claims any pain.  Interval update 5:40 PM Patient blood pressure still low and 80/40.  After 2 fluid boluses.  Afebrile, does have leukocytosis 14.  Plan to expand infection work-up to add urine and chest x-ray. Also plan for outpatient social work referral and home health services.   Final Clinical Impressions(s) / ED Diagnoses   Final diagnoses:  Hypotension  AMS (altered mental status)    ED Discharge Orders    None       Bonnita Hollow, MD 10/19/18 Brock Ra, MD 10/20/18 [phone]

## 2018-10-29 DIAGNOSIS — K5792 Diverticulitis of intestine, part unspecified, without perforation or abscess without bleeding: Secondary | ICD-10-CM | POA: Diagnosis not present

## 2018-10-29 DIAGNOSIS — I509 Heart failure, unspecified: Secondary | ICD-10-CM | POA: Diagnosis not present

## 2018-10-29 DIAGNOSIS — E119 Type 2 diabetes mellitus without complications: Secondary | ICD-10-CM | POA: Diagnosis not present

## 2018-10-29 DIAGNOSIS — J449 Chronic obstructive pulmonary disease, unspecified: Secondary | ICD-10-CM | POA: Diagnosis not present

## 2018-12-09 DEATH — deceased

## 2018-12-22 IMAGING — DX DG LUMBAR SPINE 2-3V
3 series · 3 of 3 positions shown · non-contrast
Comparison: CT scan of the abdomen and pelvis dated 12/22/2014

CLINICAL DATA: Low back pain. Posttraumatic left sacroiliac joint
pain. The patient fell several weeks ago.

EXAM:
LUMBAR SPINE - 2-3 VIEW

[l-spine ap]
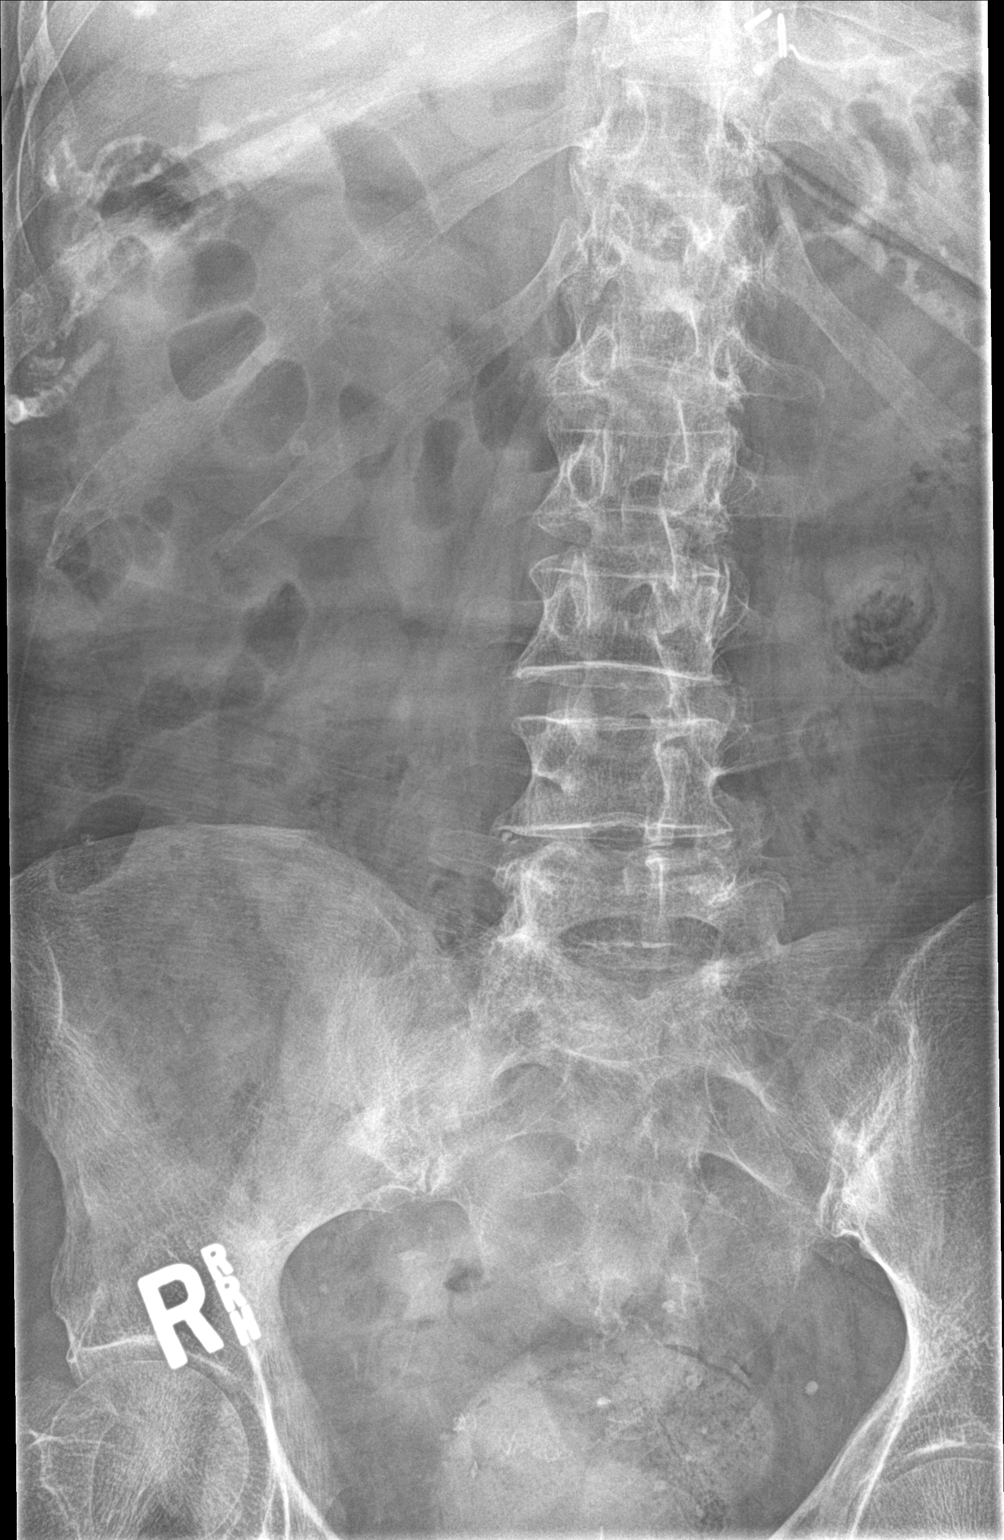

[l-spine lat]
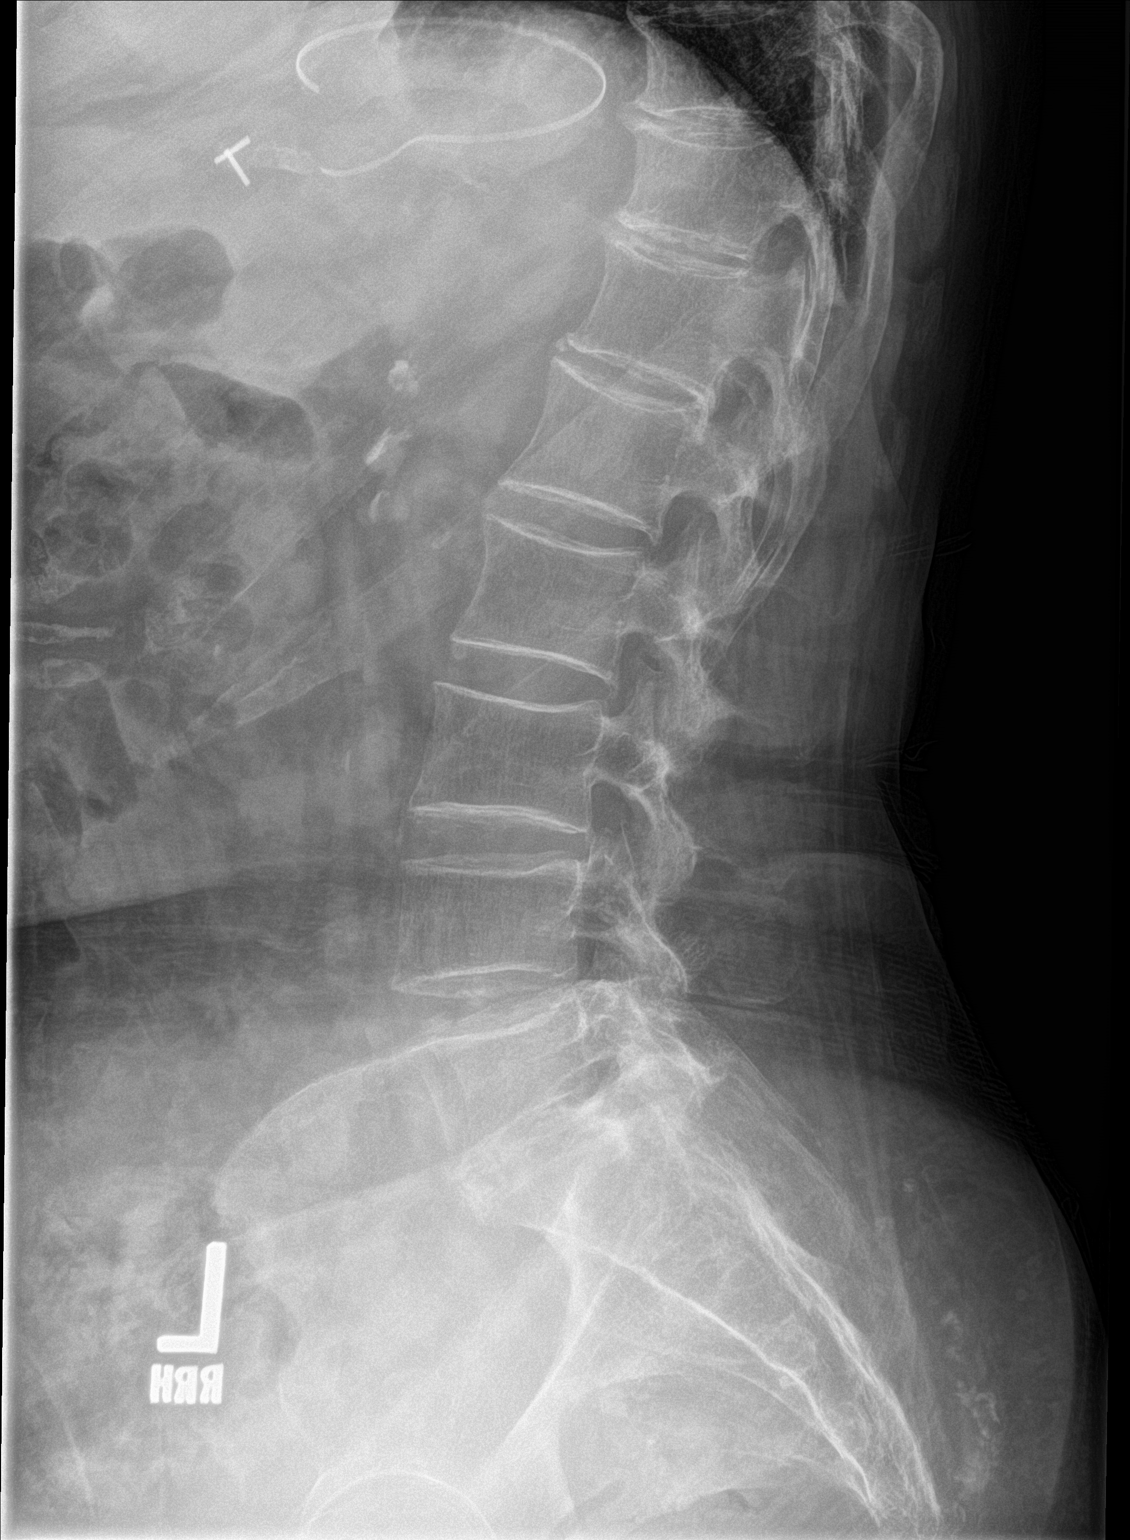

[l-spine spot]
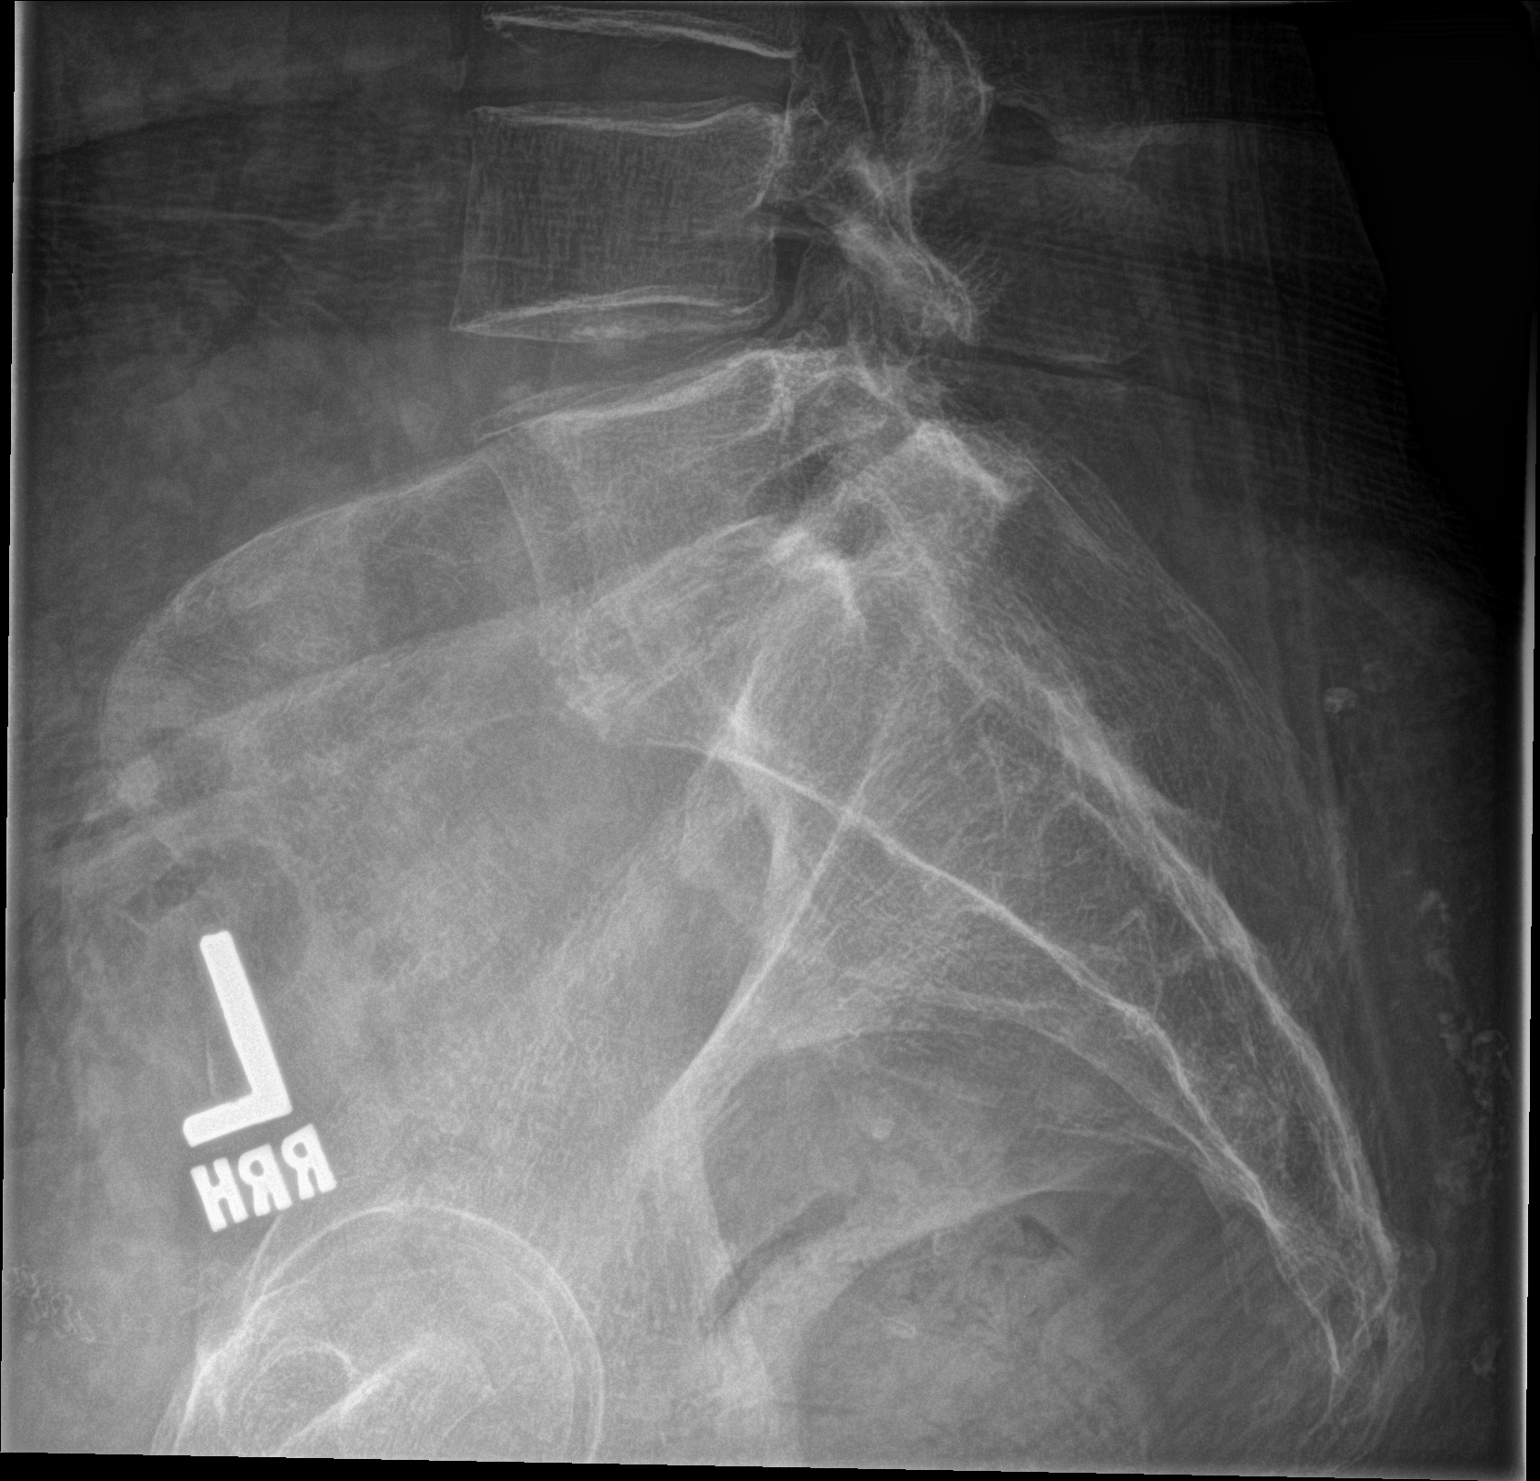

[3 of 3 positions shown; findings below may reference images not displayed]

FINDINGS: There is no evidence of lumbar spine fracture. Alignment is normal.
There is slight degeneration of the L5-S1 disc. The other disc
spaces appear normal.

Diffuse osteopenia.
IMPRESSION: No acute abnormality. Slight chronic degenerative changes of the
L5-S1 disc.

## 2019-12-10 DEATH — deceased
